# Patient Record
Sex: Female | Born: 1970
Health system: Southern US, Community
[De-identification: ages and names within clinical notes are randomized; demographics above are authoritative.]

## PROBLEM LIST (undated history)

## (undated) DIAGNOSIS — R112 Nausea with vomiting, unspecified: Secondary | ICD-10-CM

## (undated) DIAGNOSIS — R51 Headache: Secondary | ICD-10-CM

## (undated) DIAGNOSIS — G43909 Migraine, unspecified, not intractable, without status migrainosus: Secondary | ICD-10-CM

## (undated) DIAGNOSIS — E119 Type 2 diabetes mellitus without complications: Secondary | ICD-10-CM

## (undated) DIAGNOSIS — G4730 Sleep apnea, unspecified: Secondary | ICD-10-CM

## (undated) DIAGNOSIS — E559 Vitamin D deficiency, unspecified: Secondary | ICD-10-CM

## (undated) DIAGNOSIS — I10 Essential (primary) hypertension: Secondary | ICD-10-CM

## (undated) DIAGNOSIS — E669 Obesity, unspecified: Secondary | ICD-10-CM

## (undated) DIAGNOSIS — T4145XA Adverse effect of unspecified anesthetic, initial encounter: Secondary | ICD-10-CM

## (undated) DIAGNOSIS — J45909 Unspecified asthma, uncomplicated: Secondary | ICD-10-CM

## (undated) DIAGNOSIS — D649 Anemia, unspecified: Secondary | ICD-10-CM

## (undated) DIAGNOSIS — F32A Depression, unspecified: Secondary | ICD-10-CM

## (undated) DIAGNOSIS — R0602 Shortness of breath: Secondary | ICD-10-CM

## (undated) DIAGNOSIS — J45990 Exercise induced bronchospasm: Secondary | ICD-10-CM

## (undated) DIAGNOSIS — F329 Major depressive disorder, single episode, unspecified: Secondary | ICD-10-CM

## (undated) DIAGNOSIS — T8859XA Other complications of anesthesia, initial encounter: Secondary | ICD-10-CM

## (undated) DIAGNOSIS — R0982 Postnasal drip: Secondary | ICD-10-CM

## (undated) DIAGNOSIS — K589 Irritable bowel syndrome without diarrhea: Secondary | ICD-10-CM

## (undated) DIAGNOSIS — Z9889 Other specified postprocedural states: Secondary | ICD-10-CM

## (undated) DIAGNOSIS — J189 Pneumonia, unspecified organism: Secondary | ICD-10-CM

## (undated) DIAGNOSIS — F1990 Other psychoactive substance use, unspecified, uncomplicated: Secondary | ICD-10-CM

## (undated) DIAGNOSIS — E039 Hypothyroidism, unspecified: Secondary | ICD-10-CM

## (undated) DIAGNOSIS — K219 Gastro-esophageal reflux disease without esophagitis: Secondary | ICD-10-CM

## (undated) HISTORY — DX: Shortness of breath: R06.02

## (undated) HISTORY — DX: Depression, unspecified: F32.A

## (undated) HISTORY — DX: Irritable bowel syndrome, unspecified: K58.9

## (undated) HISTORY — DX: Essential (primary) hypertension: I10

## (undated) HISTORY — PX: TONSILLECTOMY: SUR1361

## (undated) HISTORY — DX: Unspecified asthma, uncomplicated: J45.909

## (undated) HISTORY — DX: Obesity, unspecified: E66.9

## (undated) HISTORY — DX: Exercise induced bronchospasm: J45.990

## (undated) HISTORY — DX: Sleep apnea, unspecified: G47.30

## (undated) HISTORY — DX: Migraine, unspecified, not intractable, without status migrainosus: G43.909

## (undated) HISTORY — DX: Gastro-esophageal reflux disease without esophagitis: K21.9

## (undated) HISTORY — PX: HERNIA REPAIR: SHX51

## (undated) HISTORY — DX: Vitamin D deficiency, unspecified: E55.9

## (undated) HISTORY — DX: Other psychoactive substance use, unspecified, uncomplicated: F19.90

## (undated) HISTORY — DX: Anemia, unspecified: D64.9

## (undated) HISTORY — DX: Type 2 diabetes mellitus without complications: E11.9

## (undated) HISTORY — DX: Major depressive disorder, single episode, unspecified: F32.9

---

## 2008-05-02 ENCOUNTER — Ambulatory Visit: Payer: Self-pay | Admitting: Occupational Medicine

## 2012-05-22 ENCOUNTER — Encounter: Payer: Self-pay | Admitting: Physician Assistant

## 2012-05-22 ENCOUNTER — Ambulatory Visit (INDEPENDENT_AMBULATORY_CARE_PROVIDER_SITE_OTHER): Payer: 59 | Admitting: Physician Assistant

## 2012-05-22 VITALS — BP 142/88 | HR 85 | Resp 14 | Ht 65.5 in | Wt 323.0 lb

## 2012-05-22 DIAGNOSIS — J45909 Unspecified asthma, uncomplicated: Secondary | ICD-10-CM

## 2012-05-22 DIAGNOSIS — E039 Hypothyroidism, unspecified: Secondary | ICD-10-CM

## 2012-05-22 DIAGNOSIS — R609 Edema, unspecified: Secondary | ICD-10-CM

## 2012-05-22 DIAGNOSIS — F341 Dysthymic disorder: Secondary | ICD-10-CM

## 2012-05-22 DIAGNOSIS — F418 Other specified anxiety disorders: Secondary | ICD-10-CM

## 2012-05-22 DIAGNOSIS — R600 Localized edema: Secondary | ICD-10-CM

## 2012-05-22 DIAGNOSIS — I10 Essential (primary) hypertension: Secondary | ICD-10-CM

## 2012-05-22 LAB — TSH: TSH: 8.651 u[IU]/mL — ABNORMAL HIGH (ref 0.350–4.500)

## 2012-05-22 LAB — COMPREHENSIVE METABOLIC PANEL
Alkaline Phosphatase: 87 U/L (ref 39–117)
Glucose, Bld: 119 mg/dL — ABNORMAL HIGH (ref 70–99)
Sodium: 137 mEq/L (ref 135–145)
Total Bilirubin: 0.4 mg/dL (ref 0.3–1.2)
Total Protein: 6.6 g/dL (ref 6.0–8.3)

## 2012-05-22 MED ORDER — BUPROPION HCL ER (XL) 150 MG PO TB24: 150.0000 mg | ORAL_TABLET | Freq: Every day | ORAL | Status: DC

## 2012-05-22 MED ORDER — AMBULATORY NON FORMULARY MEDICATION: Status: DC

## 2012-05-22 MED ORDER — LISINOPRIL-HYDROCHLOROTHIAZIDE 20-12.5 MG PO TABS: 1.0000 | ORAL_TABLET | Freq: Every day | ORAL | Status: DC

## 2012-05-22 NOTE — Patient Instructions (Addendum)
Will call with labs.   Start BP medication daily.   Start Wellbutrin for depression.   Peripheral Edema You have swelling in your legs (peripheral edema). This swelling is due to excess accumulation of salt and water in your body. Edema may be a sign of heart, kidney or liver disease, or a side effect of a medication. It may also be due to problems in the leg veins. Elevating your legs and using special support stockings may be very helpful, if the cause of the swelling is due to poor venous circulation. Avoid long periods of standing, whatever the cause. Treatment of edema depends on identifying the cause. Chips, pretzels, pickles and other salty foods should be avoided. Restricting salt in your diet is almost always needed. Water pills (diuretics) are often used to remove the excess salt and water from your body via urine. These medicines prevent the kidney from reabsorbing sodium. This increases urine flow. Diuretic treatment may also result in lowering of potassium levels in your body. Potassium supplements may be needed if you have to use diuretics daily. Daily weights can help you keep track of your progress in clearing your edema. You should call your caregiver for follow up care as recommended. SEEK IMMEDIATE MEDICAL CARE IF:   You have increased swelling, pain, redness, or heat in your legs.  You develop shortness of breath, especially when lying down.  You develop chest or abdominal pain, weakness, or fainting.  You have a fever. Document Released: 06/27/2004 Document Revised: 08/12/2011 Document Reviewed: 06/07/2009 [facility] Patient Information 2013 [place], [place].

## 2012-05-22 NOTE — Progress Notes (Signed)
  Subjective:    Patient ID: Amanda Davidson, female    DOB: 1970/08/23, 41 y.o.   MRN: [id number]  HPI Patient presents to clinic to establish care and to discuss peripheral edema of both legs. PMH was positive for hypothyroidism and migraines. She does have sleep apena and does have a cPAP.  She has had several episodes where her BP has been elevated she has not been treated. She denies any CP, palpitations, SOB, HA's or numbess or tingling. She does have swelling for lower bilateral legs that have been ongoing for 6 month or so. In the past 2 months the swelling has been daily. Denies any burning pain but does feel tight. She had been given lasix in the past and did not feel it helped or made her go to the bathroom more.  She does feel shortness of breath when do any exercises but denies any wheezing. She does have asthma that is very well controlled. She does have neb solution if needed. She has gained a lot of weight over past year about 60lbs this year. Last year this time she was biking 14 miles regularly now she is not exercising at all. She is very depressed and has some anxiety too. She tried zoloft but did not stay on it long enough and does not like taking medication for depression and anxiety.      Review of Systems     Objective:   Physical Exam  Constitutional: She is oriented to person, place, and time. She appears well-developed and well-nourished.       Obese.  HENT:  Head: Normocephalic and atraumatic.  Neck: Normal range of motion. Neck supple. No thyromegaly present.  Cardiovascular: Normal rate, regular rhythm and normal heart sounds.   Pulmonary/Chest: Effort normal and breath sounds normal. She has no wheezes.  Neurological: She is alert and oriented to person, place, and time.  Skin: Skin is warm and dry.       1+ Bilateral pitting edema up to med calf. Good pedal pulses bilaterally at 2+.Bilaterally scaly.  Psychiatric: She has a normal mood and affect. Her  behavior is normal.          Assessment & Plan:  Peripheral edema/Hypertension/Weight gain/hypothyroidism- Will check TSH/CMP/BNP. Will go ahead and treat elevated BP with Zestoretic daily. Discussed symptomatic care for swelling such as elevation and compression stockings. Gave rx for compression stockings. Follow up in 4-6 weeks to discuss swelling.   Depression- Start Wellbutrin. Discussed side effects and if worsening depression occurs to call office and stop meds. Does take 4-6 to get into systems. Recheck then.

## 2012-05-23 LAB — BRAIN NATRIURETIC PEPTIDE: Brain Natriuretic Peptide: 6.2 pg/mL (ref 0.0–100.0)

## 2012-05-24 DIAGNOSIS — G43909 Migraine, unspecified, not intractable, without status migrainosus: Secondary | ICD-10-CM | POA: Insufficient documentation

## 2012-05-24 DIAGNOSIS — E559 Vitamin D deficiency, unspecified: Secondary | ICD-10-CM | POA: Insufficient documentation

## 2012-05-24 DIAGNOSIS — G4733 Obstructive sleep apnea (adult) (pediatric): Secondary | ICD-10-CM | POA: Insufficient documentation

## 2012-05-24 DIAGNOSIS — F418 Other specified anxiety disorders: Secondary | ICD-10-CM | POA: Insufficient documentation

## 2012-05-24 DIAGNOSIS — J45909 Unspecified asthma, uncomplicated: Secondary | ICD-10-CM | POA: Insufficient documentation

## 2012-05-24 DIAGNOSIS — E039 Hypothyroidism, unspecified: Secondary | ICD-10-CM | POA: Insufficient documentation

## 2012-05-25 ENCOUNTER — Other Ambulatory Visit: Payer: Self-pay | Admitting: Physician Assistant

## 2012-05-25 MED ORDER — LEVOTHYROXINE SODIUM 112 MCG PO TABS: 112.0000 ug | ORAL_TABLET | Freq: Every day | ORAL | Status: DC

## 2012-06-11 ENCOUNTER — Telehealth: Payer: Self-pay | Admitting: *Deleted

## 2012-06-11 NOTE — Telephone Encounter (Signed)
Pharmacist calls and states that pt insurance will not cover the ventolin. Only covers the Proair or the Proventil. Please [facility] on [facility]

## 2012-06-12 MED ORDER — ALBUTEROL SULFATE HFA 108 (90 BASE) MCG/ACT IN AERS: 2.0000 | INHALATION_SPRAY | Freq: Four times a day (QID) | RESPIRATORY_TRACT | Status: DC | PRN

## 2012-06-12 NOTE — Telephone Encounter (Signed)
Sent proair inhaler to pharmacy to use only as needed. If using more than twice a week need ot have discussion about maintance inhaler.

## 2012-06-26 ENCOUNTER — Ambulatory Visit: Payer: 59 | Admitting: Physician Assistant

## 2012-06-29 ENCOUNTER — Encounter: Payer: Self-pay | Admitting: Physician Assistant

## 2012-06-29 ENCOUNTER — Ambulatory Visit (INDEPENDENT_AMBULATORY_CARE_PROVIDER_SITE_OTHER): Payer: 59 | Admitting: Physician Assistant

## 2012-06-29 VITALS — BP 139/79 | HR 108 | Resp 18 | Wt 319.0 lb

## 2012-06-29 DIAGNOSIS — G43909 Migraine, unspecified, not intractable, without status migrainosus: Secondary | ICD-10-CM

## 2012-06-29 DIAGNOSIS — E559 Vitamin D deficiency, unspecified: Secondary | ICD-10-CM

## 2012-06-29 DIAGNOSIS — I10 Essential (primary) hypertension: Secondary | ICD-10-CM

## 2012-06-29 DIAGNOSIS — E039 Hypothyroidism, unspecified: Secondary | ICD-10-CM

## 2012-06-29 DIAGNOSIS — F341 Dysthymic disorder: Secondary | ICD-10-CM

## 2012-06-29 DIAGNOSIS — R600 Localized edema: Secondary | ICD-10-CM

## 2012-06-29 DIAGNOSIS — R609 Edema, unspecified: Secondary | ICD-10-CM

## 2012-06-29 DIAGNOSIS — F418 Other specified anxiety disorders: Secondary | ICD-10-CM

## 2012-06-29 MED ORDER — LOSARTAN POTASSIUM-HCTZ 50-12.5 MG PO TABS: 1.0000 | ORAL_TABLET | Freq: Every day | ORAL | Status: DC

## 2012-06-29 MED ORDER — HYDROCODONE-HOMATROPINE 5-1.5 MG/5ML PO SYRP: 5.0000 mL | ORAL_SOLUTION | Freq: Every evening | ORAL | Status: DC | PRN

## 2012-06-29 MED ORDER — BUPROPION HCL ER (XL) 300 MG PO TB24: 300.0000 mg | ORAL_TABLET | Freq: Every day | ORAL | Status: DC

## 2012-06-29 MED ORDER — SUMATRIPTAN SUCCINATE 100 MG PO TABS: 100.0000 mg | ORAL_TABLET | ORAL | Status: DC | PRN

## 2012-06-29 NOTE — Patient Instructions (Addendum)
Try flonase/zyrtec daily to help with PND.  Stop Zestoretic and then start Hyzaar.   Increase wellbutrin.    CPE in 4 weeks.

## 2012-06-29 NOTE — Progress Notes (Signed)
Subjective:    Patient ID: Amanda Davidson, female    DOB: 10/21/1970, 42 y.o.   MRN: [id number]  HPI Patient presents to the clinic to follow up on hypertension, hypothyroidism and Depression.   HTN is ongoing and did start zestoretic daily. She was also having some bilateral edema of lower legs and it also is much better. Not sure if edema was due to thryoid levels being out of control or excess fluid and hypertension. Denies any CP, palpitations, HA's, numbness and tingling or vision changes. No associated or alleviating factors. She has had an ongoing dry cough that feels like a tickle in her throat. Seems like it started when she started BP medication. Denies fever, chills, muscle aches. She does have an inhaler that she has been using daily but does not seem to be helping. She does have asthma but usually controlled with inhaler as needed.   Hypothyroidism ongoing. Levels were elevated at last visit will recheck today. Has had more energy since last visit and feels much better. Denies starting exercise but has a plan to start in next week or so. Taking medication daily.   Depression/Anxiety ongoing but much better with Wellbutrin. She feels like she can "get up and go now". She has been cleaning her house and keeping up with life things that she was not doing before. She feels like she has not been stress eating as much.   Migraines are ongoing. She usually has 3-4 a month and stopped with Imitrex. Needs refill today.   Vitamin D deficiency is ongoing. Pt still taking 50,000 weekly and has been for over a year. Will recheck level and see if can now step down to OTC therapy.   Review of Systems  All other systems reviewed and are negative.       Objective:   Physical Exam  Constitutional: She is oriented to person, place, and time. She appears well-developed and well-nourished.  HENT:  Head: Normocephalic and atraumatic.  Right Ear: External ear normal.  Left Ear: External ear  normal.  Nose: Nose normal.  Mouth/Throat: Oropharynx is clear and moist. No oropharyngeal exudate.  Eyes: Conjunctivae normal are normal.  Neck: Normal range of motion. Neck supple.  Cardiovascular: Normal rate, regular rhythm and normal heart sounds.   Pulmonary/Chest: Effort normal and breath sounds normal. She has no wheezes.  Lymphadenopathy:    She has no cervical adenopathy.  Neurological: She is alert and oriented to person, place, and time.  Skin: Skin is warm and dry.       Mild edema around ankles bilaterally.  Psychiatric: She has a normal mood and affect. Her behavior is normal.          Assessment & Plan:  Hypertension/cough- will change medication just to make sure cough is not due to ACE inhibitor. Start new medication. Did encourage patient to use albuterol inhaler more regularly for next couple of days and cough syrup given to use as night. If cough continues asthma exacerbation might be a consideration. We might need to go on a daily ICS. Reminded of low salt diet. BP still not at goal recheck in 4 weeks to make sure BP and cough have resolved. Call before with worsening symptoms.   Depression/Anxiety- PHQ-9- 5. Pt would like to increase Wellbutrin to 300mg . Encouraged pt to start exercising and this could also give her extra benefit. Follow at CPE.   Hypothyroidism- Will recheck TSH today. Continue on levothyroxine daily. Will call with results and any changes.  Migraines- Refilled imitrex. Watch for triggers and try to stay ahead of migraines.   Vitamin D Deficiency- will check level today and see where she needs to be with therapy.   Schedule a CPE in next couple of weeks.

## 2012-06-30 DIAGNOSIS — I10 Essential (primary) hypertension: Secondary | ICD-10-CM | POA: Insufficient documentation

## 2012-07-01 ENCOUNTER — Other Ambulatory Visit: Payer: Self-pay | Admitting: Physician Assistant

## 2012-07-01 MED ORDER — LEVOTHYROXINE SODIUM 137 MCG PO TABS: 137.0000 ug | ORAL_TABLET | Freq: Every day | ORAL | Status: DC

## 2012-07-03 ENCOUNTER — Telehealth: Payer: Self-pay | Admitting: *Deleted

## 2012-07-03 ENCOUNTER — Other Ambulatory Visit: Payer: Self-pay | Admitting: Physician Assistant

## 2012-07-03 MED ORDER — ERGOCALCIFEROL 1.25 MG (50000 UT) PO CAPS: 50000.0000 [IU] | ORAL_CAPSULE | ORAL | Status: DC

## 2012-07-06 ENCOUNTER — Other Ambulatory Visit: Payer: Self-pay | Admitting: *Deleted

## 2012-07-06 ENCOUNTER — Encounter: Payer: Self-pay | Admitting: Physician Assistant

## 2012-07-06 ENCOUNTER — Ambulatory Visit (INDEPENDENT_AMBULATORY_CARE_PROVIDER_SITE_OTHER): Payer: 59 | Admitting: Physician Assistant

## 2012-07-06 VITALS — BP 139/86 | HR 99 | Wt 318.0 lb

## 2012-07-06 DIAGNOSIS — J209 Acute bronchitis, unspecified: Secondary | ICD-10-CM

## 2012-07-06 MED ORDER — SUMATRIPTAN SUCCINATE 100 MG PO TABS: 100.0000 mg | ORAL_TABLET | ORAL | Status: DC | PRN

## 2012-07-06 MED ORDER — AZITHROMYCIN 250 MG PO TABS: ORAL_TABLET | ORAL | Status: DC

## 2012-07-06 MED ORDER — BUPROPION HCL ER (XL) 300 MG PO TB24: 300.0000 mg | ORAL_TABLET | Freq: Every day | ORAL | Status: DC

## 2012-07-06 MED ORDER — PREDNISONE 50 MG PO TABS: ORAL_TABLET | ORAL | Status: DC

## 2012-07-06 NOTE — Telephone Encounter (Signed)
Med sent to pharmacy.

## 2012-07-06 NOTE — Progress Notes (Signed)
  Subjective:    Patient ID: Amanda Davidson, female    DOB: [birth year], 42 y.o.   MRN: [id number]  Cough This is a new problem. The current episode started 1 to 4 weeks ago. The problem has been gradually worsening. The problem occurs every few minutes. The cough is productive of sputum. Associated symptoms include ear congestion, headaches, nasal congestion, postnasal drip, shortness of breath and wheezing. Pertinent negatives include no chest pain, chills, ear pain, fever, heartburn, hemoptysis, myalgias, rash, rhinorrhea, sore throat, sweats or weight loss. The symptoms are aggravated by lying down. She has tried a beta-agonist inhaler, body position changes, OTC cough suppressant, rest and prescription cough suppressant for the symptoms. The treatment provided mild relief. Her past medical history is significant for asthma, bronchitis and pneumonia.       Review of Systems  Constitutional: Negative for fever, chills and weight loss.  HENT: Positive for postnasal drip. Negative for ear pain, sore throat and rhinorrhea.   Respiratory: Positive for cough, shortness of breath and wheezing. Negative for hemoptysis.   Cardiovascular: Negative for chest pain.  Gastrointestinal: Negative for heartburn.  Musculoskeletal: Negative for myalgias.  Skin: Negative for rash.  Neurological: Positive for headaches.       Objective:   Physical Exam  Constitutional: She is oriented to person, place, and time. She appears well-developed and well-nourished.  HENT:  Head: Normocephalic and atraumatic.  Right Ear: External ear normal.  Left Ear: External ear normal.  Nose: Nose normal.  Mouth/Throat: Oropharynx is clear and moist. No oropharyngeal exudate.       TM's clear bilaterally.   Negative for maxillary tenderness bilaterally.  Eyes: Conjunctivae normal are normal.  Neck: Normal range of motion. Neck supple.  Cardiovascular: Normal rate, regular rhythm and normal heart sounds.    Pulmonary/Chest: Effort normal. She has wheezes.  Lymphadenopathy:    She has no cervical adenopathy.  Neurological: She is alert and oriented to person, place, and time.  Skin:       Cheeks bilaterally flushed.  Psychiatric: She has a normal mood and affect. Her behavior is normal.          Assessment & Plan:  Acute bronchitis- Started Zpak and gave 5 days of prednisone. Continue to use cough syrup at night. Continue to use albuterol inhaler as needed. Follow up if not improving.

## 2012-07-06 NOTE — Patient Instructions (Addendum)

## 2012-07-10 ENCOUNTER — Encounter: Payer: Self-pay | Admitting: Physician Assistant

## 2012-07-10 ENCOUNTER — Ambulatory Visit (INDEPENDENT_AMBULATORY_CARE_PROVIDER_SITE_OTHER): Payer: 59

## 2012-07-10 ENCOUNTER — Ambulatory Visit (INDEPENDENT_AMBULATORY_CARE_PROVIDER_SITE_OTHER): Payer: 59 | Admitting: Physician Assistant

## 2012-07-10 VITALS — BP 136/86 | HR 92 | Temp 98.3°F | Wt 318.0 lb

## 2012-07-10 DIAGNOSIS — R0602 Shortness of breath: Secondary | ICD-10-CM

## 2012-07-10 DIAGNOSIS — R059 Cough, unspecified: Secondary | ICD-10-CM

## 2012-07-10 DIAGNOSIS — R0989 Other specified symptoms and signs involving the circulatory and respiratory systems: Secondary | ICD-10-CM

## 2012-07-10 DIAGNOSIS — R05 Cough: Secondary | ICD-10-CM

## 2012-07-10 DIAGNOSIS — R062 Wheezing: Secondary | ICD-10-CM

## 2012-07-10 DIAGNOSIS — J45901 Unspecified asthma with (acute) exacerbation: Secondary | ICD-10-CM

## 2012-07-10 DIAGNOSIS — J40 Bronchitis, not specified as acute or chronic: Secondary | ICD-10-CM

## 2012-07-10 MED ORDER — HYDROCODONE-HOMATROPINE 5-1.5 MG/5ML PO SYRP: 5.0000 mL | ORAL_SOLUTION | Freq: Every evening | ORAL | Status: DC | PRN

## 2012-07-10 MED ORDER — DOXYCYCLINE HYCLATE 100 MG PO CAPS: 100.0000 mg | ORAL_CAPSULE | Freq: Two times a day (BID) | ORAL | Status: DC

## 2012-07-10 MED ORDER — BECLOMETHASONE DIPROPIONATE 40 MCG/ACT IN AERS
2.0000 | INHALATION_SPRAY | Freq: Two times a day (BID) | RESPIRATORY_TRACT | Status: DC
Start: 2012-07-10 — End: 2012-07-28

## 2012-07-10 MED ORDER — PREDNISONE 50 MG PO TABS: ORAL_TABLET | ORAL | Status: DC

## 2012-07-10 MED ORDER — ALBUTEROL SULFATE (2.5 MG/3ML) 0.083% IN NEBU
2.5000 mg | INHALATION_SOLUTION | Freq: Four times a day (QID) | RESPIRATORY_TRACT | Status: DC | PRN
  Administered 2012-07-10: 2.5 mg via RESPIRATORY_TRACT

## 2012-07-10 NOTE — Progress Notes (Signed)
  Subjective:    Patient ID: Amanda Davidson, female    DOB: June 28, 1970, 42 y.o.   MRN: [id number]  HPI Patient presents to the clinic because still not feeling better after zpak, cough syrup and prednisone. She has finished her Hycodan and continues to cough. She continues to not have a fever, chills, muscle leg, or ear pain. She denies any flu contacts. Her cough continues to be productive with green/brown sputum. Patient denies any sinus pressure. Her throat has been sore the last couple days to 2 coughing. She continues to wheeze and was worse at night. She is using nasonex and finished zpak.      Review of Systems     Objective:   Physical Exam  Constitutional: She is oriented to person, place, and time. She appears well-developed and well-nourished.       Obese.  HENT:  Head: Normocephalic and atraumatic.  Right Ear: External ear normal.  Left Ear: External ear normal.  Mouth/Throat: Oropharynx is clear and moist. No oropharyngeal exudate.       TM's clear with no blood or pus.   Turbinates red and swollen.   Negative for maxillary or frontal tenderness.  Eyes: Conjunctivae normal are normal.  Neck: Normal range of motion. Neck supple.  Cardiovascular: Normal rate, regular rhythm and normal heart sounds.   Pulmonary/Chest: Effort normal and breath sounds normal.       No wheezing heard in the right lung significant wheezing heard at the apex of left lung with inspiration.   Lymphadenopathy:    She has no cervical adenopathy.  Neurological: She is alert and oriented to person, place, and time.  Skin: Skin is warm and dry.  Psychiatric: She has a normal mood and affect. Her behavior is normal.          Assessment & Plan:  Bronchitis/astma exacerbation-would like to rule out pneumonia therefore we'll get chest x-ray. Did give nebulizer in office and patient did improve. Continue to use nasonex daily.  Chest x-ray was negative for pneumonia but positive for bronchitis.  I did refill Hycodan for cough at night. Continue to use albuterol inhaler as needed. Added Qvar to use 2 puffs twice a day for 2 weeks. If not improving in the next 2 weeks call office. Also called in another round of prednisone.

## 2012-07-15 ENCOUNTER — Other Ambulatory Visit: Payer: Self-pay | Admitting: Physician Assistant

## 2012-07-28 ENCOUNTER — Ambulatory Visit (INDEPENDENT_AMBULATORY_CARE_PROVIDER_SITE_OTHER): Payer: 59 | Admitting: Physician Assistant

## 2012-07-28 ENCOUNTER — Encounter: Payer: Self-pay | Admitting: Physician Assistant

## 2012-07-28 VITALS — BP 133/69 | HR 91 | Wt 322.0 lb

## 2012-07-28 DIAGNOSIS — Z131 Encounter for screening for diabetes mellitus: Secondary | ICD-10-CM

## 2012-07-28 DIAGNOSIS — Z13220 Encounter for screening for lipoid disorders: Secondary | ICD-10-CM

## 2012-07-28 DIAGNOSIS — E039 Hypothyroidism, unspecified: Secondary | ICD-10-CM

## 2012-07-28 DIAGNOSIS — E559 Vitamin D deficiency, unspecified: Secondary | ICD-10-CM

## 2012-07-28 DIAGNOSIS — Z0000 Encounter for general adult medical examination without abnormal findings: Secondary | ICD-10-CM

## 2012-07-28 DIAGNOSIS — Z1239 Encounter for other screening for malignant neoplasm of breast: Secondary | ICD-10-CM

## 2012-07-28 DIAGNOSIS — J45909 Unspecified asthma, uncomplicated: Secondary | ICD-10-CM

## 2012-07-28 NOTE — Progress Notes (Signed)
  Subjective:     Amanda Davidson is a 42 y.o. female and is here for a comprehensive physical exam. The patient reports problems - Pt is still having problems with SOB, Wheezing, and cough since being treated for bronchitis. She has asthma but in the past was controlled with albuterol inhaler. .  History   Social History  . Marital Status: Legally Separated    Spouse Name: N/A    Number of Children: N/A  . Years of Education: N/A   Occupational History  . Not on file.   Social History Main Topics  . Smoking status: Never Smoker   . Smokeless tobacco: Never Used  . Alcohol Use: 0.5 oz/week    1 drink(s) per week  . Drug Use: No  . Sexually Active: Not on file   Other Topics Concern  . Not on file   Social History Narrative  . No narrative on file   Health Maintenance  Topic Date Due  . Influenza Vaccine  02/01/1970  . Mammogram  05/23/2011  . Pap Smear  03/23/2015  . Tetanus/tdap  05/22/2017    The following portions of the patient's history were reviewed and updated as appropriate: allergies, current medications, past family history, past medical history, past social history, past surgical history and problem list.  Review of Systems A comprehensive review of systems was negative.   Objective:    BP 133/69  Pulse 91  Wt 322 lb (146.058 kg)  BMI 52.75 kg/m2  LMP 07/04/2012 General appearance: alert, cooperative, appears stated age and moderately obese Head: Normocephalic, without obvious abnormality, atraumatic Eyes: conjunctivae/corneas clear. PERRL, EOM's intact. Fundi benign. Ears: normal TM's and external ear canals both ears Nose: Nares normal. Septum midline. Mucosa normal. No drainage or sinus tenderness. Throat: lips, mucosa, and tongue normal; teeth and gums normal Neck: no adenopathy, no carotid bruit, no JVD, supple, symmetrical, trachea midline and thyroid not enlarged, symmetric, no tenderness/mass/nodules Back: symmetric, no curvature. ROM normal.  No CVA tenderness. Lungs: clear to auscultation bilaterally Heart: regular rate and rhythm, S1, S2 normal, no murmur, click, rub or gallop Abdomen: soft, non-tender; bowel sounds normal; no masses,  no organomegaly Extremities: extremities normal, atraumatic, no cyanosis or edema Pulses: 2+ and symmetric Skin: Skin color, texture, turgor normal. No rashes or lesions Lymph nodes: Cervical, supraclavicular, and axillary nodes normal. Neurologic: Grossly normal    Assessment:    Healthy female exam.      Plan:    CPE/asthmatic bronchitis-Patient gets [facility]. Vaccines up to date. Gave labs to get drawn with recheck of TSH and Vitamin D in 2 weeks. Stopped Qvar and started Symbicort 80/160 2 puffs BID. Continue to use albuterol inhaler as needed. If not improving call office. Continue to try to exercise regularly.  See After Visit Summary for Counseling Recommendations

## 2012-07-28 NOTE — Patient Instructions (Signed)
Get labs drawn in 2 weeks with mammogram.   Will call with labs.   Try symbicort stop qvar 2 puffs twice a day.

## 2012-08-05 ENCOUNTER — Ambulatory Visit (HOSPITAL_BASED_OUTPATIENT_CLINIC_OR_DEPARTMENT_OTHER): Payer: 59

## 2012-08-28 LAB — COMPREHENSIVE METABOLIC PANEL
AST: 24 U/L (ref 0–37)
Albumin: 3.6 g/dL (ref 3.5–5.2)
Alkaline Phosphatase: 77 U/L (ref 39–117)
BUN: 12 mg/dL (ref 6–23)
Calcium: 9.4 mg/dL (ref 8.4–10.5)
Chloride: 103 mEq/L (ref 96–112)
Glucose, Bld: 115 mg/dL — ABNORMAL HIGH (ref 70–99)
Potassium: 4.7 mEq/L (ref 3.5–5.3)
Sodium: 139 mEq/L (ref 135–145)
Total Protein: 6.2 g/dL (ref 6.0–8.3)

## 2012-08-28 LAB — LIPID PANEL
Cholesterol: 141 mg/dL (ref 0–200)
Total CHOL/HDL Ratio: 4 Ratio
VLDL: 21 mg/dL (ref 0–40)

## 2012-08-28 LAB — TSH: TSH: 2.043 u[IU]/mL (ref 0.350–4.500)

## 2012-08-29 LAB — VITAMIN D 25 HYDROXY (VIT D DEFICIENCY, FRACTURES): Vit D, 25-Hydroxy: 31 ng/mL (ref 30–89)

## 2012-08-31 ENCOUNTER — Telehealth: Payer: Self-pay | Admitting: *Deleted

## 2012-08-31 DIAGNOSIS — R7301 Impaired fasting glucose: Secondary | ICD-10-CM

## 2012-08-31 NOTE — Telephone Encounter (Signed)
a1c order placed

## 2012-09-08 ENCOUNTER — Other Ambulatory Visit: Payer: Self-pay | Admitting: *Deleted

## 2012-09-08 DIAGNOSIS — R7301 Impaired fasting glucose: Secondary | ICD-10-CM

## 2012-09-14 ENCOUNTER — Other Ambulatory Visit: Payer: Self-pay | Admitting: *Deleted

## 2012-09-14 MED ORDER — METFORMIN HCL 500 MG PO TABS: 500.0000 mg | ORAL_TABLET | Freq: Two times a day (BID) | ORAL | Status: DC

## 2012-09-16 ENCOUNTER — Encounter: Payer: Self-pay | Admitting: Physician Assistant

## 2012-09-16 ENCOUNTER — Ambulatory Visit (INDEPENDENT_AMBULATORY_CARE_PROVIDER_SITE_OTHER): Payer: 59 | Admitting: Physician Assistant

## 2012-09-16 VITALS — BP 128/73 | HR 89 | Wt 317.0 lb

## 2012-09-16 DIAGNOSIS — E119 Type 2 diabetes mellitus without complications: Secondary | ICD-10-CM

## 2012-09-16 DIAGNOSIS — J45909 Unspecified asthma, uncomplicated: Secondary | ICD-10-CM

## 2012-09-16 DIAGNOSIS — J4540 Moderate persistent asthma, uncomplicated: Secondary | ICD-10-CM

## 2012-09-16 DIAGNOSIS — J309 Allergic rhinitis, unspecified: Secondary | ICD-10-CM

## 2012-09-16 MED ORDER — LORCASERIN HCL 10 MG PO TABS: 10.0000 mg | ORAL_TABLET | Freq: Two times a day (BID) | ORAL | Status: DC

## 2012-09-16 MED ORDER — METFORMIN HCL 1000 MG PO TABS: 1000.0000 mg | ORAL_TABLET | Freq: Two times a day (BID) | ORAL | Status: DC

## 2012-09-16 MED ORDER — MONTELUKAST SODIUM 10 MG PO TABS: 10.0000 mg | ORAL_TABLET | Freq: Every day | ORAL | Status: DC

## 2012-09-16 MED ORDER — MOMETASONE FUROATE 50 MCG/ACT NA SUSP: 2.0000 | Freq: Every day | NASAL | Status: DC

## 2012-09-16 NOTE — Progress Notes (Signed)
  Subjective:    Patient ID: Amanda Davidson, female    DOB: Oct 24, 1970, 41 y.o.   MRN: [id number]  HPI Patient comes in today to talk about recent dx of diabetes.   She also has been using her inhaler for asthma twice a day or even more. She has a history of asthma but has never needed an every day inhaler. She does have allergies and taking claritin daily. REports chest tightness, SOB, wheezing on a regular basis. Denies any fever, chills. Dry cough occasionally. Nothing makes worse and albuterol does help.      Review of Systems     Objective:   Physical Exam  Constitutional: She is oriented to person, place, and time. She appears well-developed and well-nourished.  HENT:  Head: Normocephalic and atraumatic.  Cardiovascular: Normal rate, regular rhythm and normal heart sounds.   Pulmonary/Chest:  Mild distant wheezing bilaterally.   Neurological: She is alert and oriented to person, place, and time.  Skin: Skin is warm and dry.  Psychiatric: She has a normal mood and affect. Her behavior is normal.          Assessment & Plan:  Diabetes mellulitis/obesity- A!C was 6.9. Will refer to diabetic nutrition. Gave handout of diets today. Started on metformin and tolerated 500mg  BID dose. Increased to 1000mg  BID. Discussed regular exercise. Talked about sugars and carbs. Encouraged pt to increase protein. Discussed effects of Diabetes and importance of sugar control. Weight loss is also important. Started on Belviq. Encouraged eye exam and regular maintence. Follow up in 3 months.   Asthma, uncontrolled/allergies- Pt resisent to try daily inhaler. Gave sample of symbicort 2 puffs BID. Follow up in 1 month. If doing better will give rx. Call if run out of sample for rx. Goal is to not use albuterol daily but no more than once a week. Continue on claritin. Added singulair. Gave rx for nasonex.  Spent 30 minutes with patient and greater than 50 percent of time spent counseling patient  regarding obesity and diabetes management.

## 2012-09-16 NOTE — Patient Instructions (Addendum)
Diabetes and Exercise Regular exercise is important and can help:   Control blood glucose (sugar).  Decrease blood pressure.    Control blood lipids (cholesterol, triglycerides).  Improve overall health. BENEFITS FROM EXERCISE  Improved fitness.  Improved flexibility.  Improved endurance.  Increased bone density.  Weight control.  Increased muscle strength.  Decreased body fat.  Improvement of the body's use of insulin, a hormone.  Increased insulin sensitivity.  Reduction of insulin needs.  Reduced stress and tension.  Helps you feel better. People with diabetes who add exercise to their lifestyle gain additional benefits, including:  Weight loss.  Reduced appetite.  Improvement of the body's use of blood glucose.  Decreased risk factors for heart disease:  Lowering of cholesterol and triglycerides.  Raising the level of good cholesterol (high-density lipoproteins, HDL).  Lowering blood sugar.  Decreased blood pressure. TYPE 1 DIABETES AND EXERCISE  Exercise will usually lower your blood glucose.  If blood glucose is greater than 240 mg/dl, check urine ketones. If ketones are present, do not exercise.  Location of the insulin injection sites may need to be adjusted with exercise. Avoid injecting insulin into areas of the body that will be exercised. For example, avoid injecting insulin into:  The arms when playing tennis.  The legs when jogging. For more information, discuss this with your caregiver.  Keep a record of:  Food intake.  Type and amount of exercise.  Expected peak times of insulin action.  Blood glucose levels. Do this before, during, and after exercise. Review your records with your caregiver. This will help you to develop guidelines for adjusting food intake and insulin amounts.  TYPE 2 DIABETES AND EXERCISE  Regular physical activity can help control blood glucose.  Exercise is important because it may:  Increase the  body's sensitivity to insulin.  Improve blood glucose control.  Exercise reduces the risk of heart disease. It decreases serum cholesterol and triglycerides. It also lowers blood pressure.  Those who take insulin or oral hypoglycemic agents should watch for signs of hypoglycemia. These signs include dizziness, shaking, sweating, chills, and confusion.  Body water is lost during exercise. It must be replaced. This will help to avoid loss of body fluids (dehydration) or heat stroke. Be sure to talk to your caregiver before starting an exercise program to make sure it is safe for you. Remember, any activity is better than none.  Document Released: 08/10/2003 Document Revised: 08/12/2011 Document Reviewed: 11/24/2008 [facility] Patient Information 2013 [place], [place]. 1800 Calorie Diet for Diabetes Meal Planning The 1800 calorie diet is designed for eating up to 1800 calories each day. Following this diet and making healthy meal choices can help improve overall health. This diet controls blood sugar (glucose) levels and can also help lower blood pressure and cholesterol. SERVING SIZES Measuring foods and serving sizes helps to make sure you are getting the right amount of food. The list below tells how big or small some common serving sizes are:  1 oz.........4 stacked dice.  3 oz........Marland KitchenDeck of cards.  1 tsp.......Marland KitchenTip of little finger.  1 tbs......Marland KitchenMarland KitchenThumb.  2 tbs.......Marland KitchenGolf ball.   cup......Marland KitchenHalf of a fist.  1 cup.......Marland KitchenA fist. GUIDELINES FOR CHOOSING FOODS The goal of this diet is to eat a variety of foods and limit calories to 1800 each day. This can be done by choosing foods that are low in calories and fat. The diet also suggests eating small amounts of food frequently. Doing this helps control your blood glucose levels so they do  not get too high or too low. Each meal or snack may include a protein food source to help you feel more satisfied and to stabilize your blood glucose.  Try to eat about the same amount of food around the same time each day. This includes weekend days, travel days, and days off work. Space your meals about 4 to 5 hours apart and add a snack between them if you wish.  For example, a daily food plan could include breakfast, a morning snack, lunch, dinner, and an evening snack. Healthy meals and snacks include whole grains, vegetables, fruits, lean meats, poultry, fish, and dairy products. As you plan your meals, select a variety of foods. Choose from the bread and starch, vegetable, fruit, dairy, and meat/protein groups. Examples of foods from each group and their suggested serving sizes are listed below. Use measuring cups and spoons to become familiar with what a healthy portion looks like. Bread and Starch Each serving equals 15 grams of carbohydrates.  1 slice bread.   bagel.   cup cold cereal (unsweetened).   cup hot cereal or mashed potatoes.  1 small potato (size of a computer mouse).   cup cooked pasta or rice.   English muffin.  1 cup broth-based soup.  3 cups of popcorn.  4 to 6 whole-wheat crackers.   cup cooked beans, peas, or corn. Vegetable Each serving equals 5 grams of carbohydrates.   cup cooked vegetables.  1 cup raw vegetables.   cup tomato or vegetable juice. Fruit Each serving equals 15 grams of carbohydrates.  1 small apple or orange.  1 cup watermelon or strawberries.   cup applesauce (no sugar added).  2 tbs raisins.   banana.   cup canned fruit, packed in water, its own juice, or sweetened with a sugar substitute.   cup unsweetened fruit juice. Dairy Each serving equals 12 to 15 grams of carbohydrates.  1 cup fat-free milk.  6 oz artificially sweetened yogurt or plain yogurt.  1 cup low-fat buttermilk.  1 cup soy milk.  1 cup almond milk. Meat/Protein  1 large egg.  2 to 3 oz meat, poultry, or fish.   cup low-fat cottage cheese.  1 tbs peanut butter.  1 oz  low-fat cheese.   cup tuna in water.   cup tofu. Fat  1 tsp oil.  1 tsp trans-fat-free margarine.  1 tsp butter.  1 tsp mayonnaise.  2 tbs avocado.  1 tbs salad dressing.  1 tbs cream cheese.  2 tbs sour cream. SAMPLE 1800 CALORIE DIET PLAN Breakfast   cup unsweetened cereal (1 carb serving).  1 cup fat-free milk (1 carb serving).  1 slice whole-wheat toast (1 carb serving).   small banana (1 carb serving).  1 scrambled egg.  1 tsp trans-fat-free margarine. Lunch  Tuna sandwich.  2 slices whole-wheat bread (2 carb servings).   cup canned tuna in water, drained.  1 tbs reduced fat mayonnaise.  1 stalk celery, chopped.  2 slices tomato.  1 lettuce leaf.  1 cup carrot sticks.  24 to 30 seedless grapes (2 carb servings).  6 oz light yogurt (1 carb serving). Afternoon Snack  3 graham cracker squares (1 carb serving).  Fat-free milk, 1 cup (1 carb serving).  1 tbs peanut butter. Dinner  3 oz salmon, broiled with 1 tsp oil.  1 cup mashed potatoes (2 carb servings) with 1 tsp trans-fat-free margarine.  1 cup fresh or frozen green beans.  1 cup steamed asparagus.  1 cup fat-free milk (1 carb serving). Evening Snack  3 cups air-popped popcorn (1 carb serving).  2 tbs parmesan cheese sprinkled on top. MEAL PLAN Use this worksheet to help you make a daily meal plan based on the 1800 calorie diet suggestions. If you are using this plan to help you control your blood glucose, you may interchange carbohydrate-containing foods (dairy, starches, and fruits). Select a variety of fresh foods of varying colors and flavors. The total amount of carbohydrate in your meals or snacks is more important than making sure you include all of the food groups every time you eat. Choose from the following foods to build your day's meals:  8 Starches.  4 Vegetables.  3 Fruits.  2 Dairy.  6 to 7 oz Meat/Protein.  Up to 4 Fats. Your dietician can use  this worksheet to help you decide how many servings and which types of foods are right for you. BREAKFAST Food Group and Servings / Food Choice Starch ________________________________________________________ Dairy _________________________________________________________ Fruit _________________________________________________________ Meat/Protein __________________________________________________ Fat ___________________________________________________________ LUNCH Food Group and Servings / Food Choice Starch ________________________________________________________ Meat/Protein __________________________________________________ Vegetable _____________________________________________________ Fruit _________________________________________________________ Dairy _________________________________________________________ Fat ___________________________________________________________ Aura Fey Food Group and Servings / Food Choice Starch ________________________________________________________ Meat/Protein __________________________________________________ Fruit __________________________________________________________ Dairy _________________________________________________________ Laural Golden Food Group and Servings / Food Choice Starch _________________________________________________________ Meat/Protein ___________________________________________________ Dairy __________________________________________________________ Vegetable ______________________________________________________ Fruit ___________________________________________________________ Fat ____________________________________________________________ Lollie Sails Food Group and Servings / Food Choice Fruit __________________________________________________________ Meat/Protein ___________________________________________________ Dairy __________________________________________________________ Starch  _________________________________________________________ DAILY TOTALS Starch ____________________________ Vegetable _________________________ Fruit _____________________________ Dairy _____________________________ Meat/Protein______________________ Fat _______________________________ Document Released: 12/10/2004 Document Revised: 08/12/2011 Document Reviewed: 04/05/2011 [employer] Patient Information 2013 [place], [place].

## 2012-09-18 DIAGNOSIS — J4540 Moderate persistent asthma, uncomplicated: Secondary | ICD-10-CM | POA: Insufficient documentation

## 2012-09-18 DIAGNOSIS — E119 Type 2 diabetes mellitus without complications: Secondary | ICD-10-CM | POA: Insufficient documentation

## 2012-09-30 ENCOUNTER — Other Ambulatory Visit: Payer: Self-pay | Admitting: Physician Assistant

## 2012-10-07 ENCOUNTER — Ambulatory Visit (INDEPENDENT_AMBULATORY_CARE_PROVIDER_SITE_OTHER): Payer: 59 | Admitting: Physician Assistant

## 2012-10-07 ENCOUNTER — Encounter: Payer: Self-pay | Admitting: Physician Assistant

## 2012-10-07 VITALS — BP 128/85 | HR 99 | Wt 307.0 lb

## 2012-10-07 DIAGNOSIS — R0602 Shortness of breath: Secondary | ICD-10-CM

## 2012-10-07 DIAGNOSIS — R062 Wheezing: Secondary | ICD-10-CM

## 2012-10-07 DIAGNOSIS — J45901 Unspecified asthma with (acute) exacerbation: Secondary | ICD-10-CM

## 2012-10-07 MED ORDER — BUDESONIDE-FORMOTEROL FUMARATE 80-4.5 MCG/ACT IN AERO: 2.0000 | INHALATION_SPRAY | Freq: Two times a day (BID) | RESPIRATORY_TRACT | Status: DC

## 2012-10-07 MED ORDER — ALBUTEROL SULFATE (2.5 MG/3ML) 0.083% IN NEBU
2.5000 mg | INHALATION_SOLUTION | Freq: Once | RESPIRATORY_TRACT | Status: AC
  Administered 2012-10-07: 2.5 mg via RESPIRATORY_TRACT

## 2012-10-07 MED ORDER — PREDNISONE 50 MG PO TABS: ORAL_TABLET | ORAL | Status: DC

## 2012-10-07 NOTE — Progress Notes (Signed)
  Subjective:    Patient ID: Amanda Davidson, female    DOB: 03-Oct-1970, 42 y.o.   MRN: [id number]  HPI Patient presents to the clinic with SOB and wheezing for the last 5 days. She has asthma that has been controlled on Symbicort, sinuglair and zyrtec.She has been out of zyrtec. Until recent exacerbation she did not have to use albuterol inhaler. For the last 5 days she has had to use multiple times a day. She has not used yet today. She went to [facility] and was outside a lot. Cough is productive and feels very tight in her chest. No fever, chills.     Review of Systems     Objective:   Physical Exam  Constitutional: She appears well-developed and well-nourished.  HENT:  Head: Normocephalic and atraumatic.  Cardiovascular: Normal rate, regular rhythm and normal heart sounds.   Pulmonary/Chest: She has wheezes.  Wheezing bilaterally.  Skin: Skin is warm.  Skin seemed clammy.  Psychiatric: She has a normal mood and affect. Her behavior is normal.          Assessment & Plan:  Asthma exacerbation- nebulizer in office. Refilled symbicort. Steroid given for 5 days. Continue using albuterol as needed. Add zyrtec back in and continue on singulair. Follow up if not improving could consider Abx or increase symbicort dose.

## 2012-10-07 NOTE — Patient Instructions (Addendum)
Zyrtec/singulair and symbicort 2 puffs twice a day. Albuterol as needed. Take steroid for 5 days.    Call if not improving.

## 2012-10-18 ENCOUNTER — Other Ambulatory Visit: Payer: Self-pay | Admitting: Physician Assistant

## 2012-10-22 ENCOUNTER — Encounter: Payer: Self-pay | Admitting: Dietician

## 2012-10-22 ENCOUNTER — Encounter: Payer: 59 | Attending: "Endocrinology | Admitting: Dietician

## 2012-10-22 VITALS — Ht 66.0 in | Wt 302.5 lb

## 2012-10-22 DIAGNOSIS — Z713 Dietary counseling and surveillance: Secondary | ICD-10-CM | POA: Insufficient documentation

## 2012-10-22 DIAGNOSIS — E119 Type 2 diabetes mellitus without complications: Secondary | ICD-10-CM | POA: Insufficient documentation

## 2012-10-22 NOTE — Progress Notes (Signed)
  Patient was seen on 10/22/2012 for the first of a series of three diabetes self-management courses at [facility]. The following learning objectives were met by the patient during this course:    HT:66 in  WT: 302.5 lb  A1C: 6.9% (08/28/2012)   Defines the role of glucose and insulin  Identifies type of diabetes and pathophysiology  Defines the diagnostic criteria for diabetes and prediabetes  States the risk factors for Type 2 Diabetes  States the symptoms of Type 2 Diabetes  Defines Type 2 Diabetes treatment goals  Defines Type 2 Diabetes treatment options  States the rationale for glucose monitoring  Identifies A1C, glucose targets, and testing times  Identifies proper sharps disposal  Defines the purpose of a diabetes food plan  Identifies carbohydrate food groups  Defines effects of carbohydrate foods on glucose levels  Identifies carbohydrate choices/grams/food labels  States benefits of physical activity and effect on glucose  Review of suggested activity guidelines  Handouts given during class include:  Type 2 Diabetes: Basics Book  My Food Plan Book  Food and Activity Log   Follow-Up Plan: Signed up for DM Self-management Core Class 2.  To check with insurance for coverage of classes

## 2012-12-10 ENCOUNTER — Other Ambulatory Visit: Payer: Self-pay

## 2012-12-16 ENCOUNTER — Other Ambulatory Visit: Payer: Self-pay | Admitting: Physician Assistant

## 2012-12-18 ENCOUNTER — Other Ambulatory Visit: Payer: Self-pay | Admitting: Physician Assistant

## 2012-12-21 ENCOUNTER — Ambulatory Visit: Payer: 59 | Admitting: Physician Assistant

## 2012-12-25 ENCOUNTER — Ambulatory Visit (INDEPENDENT_AMBULATORY_CARE_PROVIDER_SITE_OTHER): Payer: 59 | Admitting: Physician Assistant

## 2012-12-25 ENCOUNTER — Encounter: Payer: Self-pay | Admitting: Physician Assistant

## 2012-12-25 VITALS — BP 119/74 | HR 80 | Wt 299.0 lb

## 2012-12-25 DIAGNOSIS — E119 Type 2 diabetes mellitus without complications: Secondary | ICD-10-CM

## 2012-12-25 LAB — POCT GLYCOSYLATED HEMOGLOBIN (HGB A1C): Hemoglobin A1C: 5.9

## 2012-12-25 MED ORDER — LORCASERIN HCL 10 MG PO TABS: 10.0000 mg | ORAL_TABLET | Freq: Two times a day (BID) | ORAL | Status: DC

## 2012-12-25 NOTE — Progress Notes (Signed)
  Subjective:    Patient ID: Amanda Davidson, female    DOB: [birth year], 42 y.o.   MRN: [id number]  Diabetes She presents for her follow-up diabetic visit. She has type 2 diabetes mellitus. No MedicAlert identification noted. Her disease course has been improving. There are no hypoglycemic associated symptoms. There are no diabetic associated symptoms. There are no hypoglycemic complications. Symptoms are resolved. There are no diabetic complications. Risk factors for coronary artery disease include diabetes mellitus, family history, stress, obesity and hypertension. Current diabetic treatment includes diet and oral agent (monotherapy). She is compliant with treatment all of the time. Her weight is decreasing steadily. She is following a generally healthy diet. Meal planning includes avoidance of concentrated sweets. She has not had a previous visit with a dietician. She participates in exercise three times a week. An ACE inhibitor/angiotensin II receptor blocker is being taken. She does not see a podiatrist.Eye exam is current.  Pt is doing great and feels great.  Would like to talk about surgery options for weight loss.  Never started belviq because insurance would not pay.  Currently in weight watchers.      Review of Systems     Objective:   Physical Exam  Constitutional: She is oriented to person, place, and time. She appears well-developed and well-nourished.  HENT:  Head: Normocephalic and atraumatic.  Cardiovascular: Normal rate, regular rhythm and normal heart sounds.   Pulmonary/Chest: Effort normal and breath sounds normal.  Neurological: She is alert and oriented to person, place, and time.  Skin: Skin is warm and dry.  Psychiatric: She has a normal mood and affect. Her behavior is normal.          Assessment & Plan:  DM- A1C is 5.9. Discussed with patient she could decrease metformin to 500mg  BID but pt likes the way Metformin makes her feel and she feels like it helps  her with weight loss. I wrote new rx for Belviq previously ins stated they would not pay but she is going to look more into it and see if she cannot get approved. We discussed gastric bypass/sleeve surgery. Discussed benefits and risk. I reassured her that I would try to do it on her on and make the lifestyle changes and then if still not where we want her to be then could refer out to consider. She is on board with that option. Encouraged regular exercise and to increase intensity as well as being strict with weight watchers. We set a goal of 21 lbs of weight loss in the next 3 months. F/u in 3 months.    Spent 30 minutes with patient and greater than 50 percent of visit spent counseling patient regarding weight loss surgery.

## 2012-12-25 NOTE — Patient Instructions (Addendum)
Goal 21lbs of weight loss in 3 months.

## 2013-01-21 ENCOUNTER — Other Ambulatory Visit: Payer: Self-pay | Admitting: Physician Assistant

## 2013-02-10 ENCOUNTER — Ambulatory Visit (INDEPENDENT_AMBULATORY_CARE_PROVIDER_SITE_OTHER): Payer: 59 | Admitting: General Surgery

## 2013-02-10 ENCOUNTER — Encounter (INDEPENDENT_AMBULATORY_CARE_PROVIDER_SITE_OTHER): Payer: Self-pay | Admitting: General Surgery

## 2013-02-10 VITALS — BP 140/88 | HR 72 | Resp 16 | Ht 66.0 in | Wt 294.6 lb

## 2013-02-10 DIAGNOSIS — E119 Type 2 diabetes mellitus without complications: Secondary | ICD-10-CM

## 2013-02-10 DIAGNOSIS — J45909 Unspecified asthma, uncomplicated: Secondary | ICD-10-CM

## 2013-02-10 DIAGNOSIS — I10 Essential (primary) hypertension: Secondary | ICD-10-CM

## 2013-02-10 DIAGNOSIS — F329 Major depressive disorder, single episode, unspecified: Secondary | ICD-10-CM

## 2013-02-10 DIAGNOSIS — F32A Depression, unspecified: Secondary | ICD-10-CM

## 2013-02-10 DIAGNOSIS — Z6841 Body Mass Index (BMI) 40.0 and over, adult: Secondary | ICD-10-CM

## 2013-02-10 DIAGNOSIS — E039 Hypothyroidism, unspecified: Secondary | ICD-10-CM

## 2013-02-10 NOTE — Progress Notes (Signed)
Patient ID: Amanda Davidson, female   DOB: 22-Feb-1971, 42 y.o.   MRN: [id number]  Chief Complaint  Patient presents with  . Bariatric Pre-op    HPI Fredrick Dray is a 42 y.o. female.  This patient presents today for her initial weight loss surgery evaluation. She has a tender information session and comes in today interested in the vertical sleeve gastrectomy. She has a BMI of 47.5 with obesity related comorbidities of diabetes mellitus, hypertension, depression, and asthma. She has trouble with her weight ever since the birth of her children and she put on several pounds during her pregnancy but was unable to lose the weight after the birth. She has tried several diets including Weight Watchers and hCG shots and is currently on Belviq for weight loss.  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 She denies any significant reflux. She does have a history of umbilical hernia repair with mesh but she says that she has a recurrence just above her umbilicus as well. Otherwise she is current on her mammogram and routine health maintenance. HPI  Past Medical History  Diagnosis Date  . Hypertension   . Obesity   . Thyroid disease     hypo  . Exercise-induced asthma   . Diabetes mellitus without complication   . Sleep apnea     Past Surgical History  Procedure Laterality Date  . Hernia repair      Family History  Problem Relation Age of Onset  . Hypertension Mother   . Hypertension Father   . Diabetes Father   . Heart disease Maternal Grandfather   . Heart attack Maternal Grandfather   . Cancer Maternal Grandfather     bladder  . Hyperlipidemia Other   . Sleep apnea Other     Social History History  Substance Use Topics  . Smoking status: Never Smoker   . Smokeless tobacco: Never Used  . Alcohol Use: 0.5 oz/week    1 drink(s) per week    No Known Allergies  Current Outpatient Prescriptions  Medication  Sig Dispense Refill  . albuterol (PROVENTIL HFA;VENTOLIN HFA) 108 (90 BASE) MCG/ACT inhaler Inhale 2 puffs into the lungs every 6 (six) hours as needed for wheezing.  1 Inhaler  3  . AMBULATORY NON FORMULARY MEDICATION Medication Name: knee high compression stocking for both legs.  2 each  0  . budesonide-formoterol (SYMBICORT) 80-4.5 MCG/ACT inhaler Inhale 2 puffs into the lungs 2 (two) times daily.  1 Inhaler  6  . buPROPion (WELLBUTRIN XL) 300 MG 24 hr tablet Take 1 tablet (300 mg total) by mouth daily.  30 tablet  5  . levothyroxine (SYNTHROID, LEVOTHROID) 137 MCG tablet TAKE ONE TABLET BY MOUTH ONE TIME DAILY   30 tablet  3  . Lorcaserin HCl (BELVIQ) 10 MG TABS Take 10 mg by mouth 2 (two) times daily.  60 tablet  2  . losartan-hydrochlorothiazide (HYZAAR) 50-12.5 MG per tablet TAKE ONE TABLET ONCE DAILY  30 tablet  2  . metFORMIN (GLUCOPHAGE) 1000 MG tablet Take 1 tablet (1,000 mg total) by mouth 2 (two) times daily with a meal.  60 tablet  5  . montelukast (SINGULAIR) 10 MG tablet Take 1 tablet (10 mg total) by mouth at bedtime.  30 tablet  6  . SUMAtriptan (IMITREX) 100 MG tablet TAKE 1 TABLET (100 MG TOTAL) BY MOUTH EVERY 2 (TWO) HOURS AS NEEDED.  10 tablet  0  . mometasone (NASONEX) 50 MCG/ACT nasal spray Place 2 sprays into the nose daily.  17 g  6   No current facility-administered medications for this visit.    Review of Systems Review of Systems All other review of systems negative or noncontributory except as stated in the HPI  Blood pressure 140/88, pulse 72, resp. rate 16, height 5\' 6"  (1.676 m), weight 294 lb 9.6 oz (133.63 kg).  Physical Exam Physical Exam Physical Exam  Nursing note and vitals reviewed. Constitutional: She is oriented to person, place, and time. She appears well-developed and well-nourished. No distress.  HENT:  Head: Normocephalic and atraumatic.  Mouth/Throat: No oropharyngeal exudate.  Eyes: Conjunctivae and EOM are normal. Pupils are equal, round,  and reactive to light. Right eye exhibits no discharge. Left eye exhibits no discharge. No scleral icterus.  Neck: Normal range of motion. Neck supple. No tracheal deviation present.  Cardiovascular: Normal rate, regular rhythm, normal heart sounds and intact distal pulses.   Pulmonary/Chest: Effort normal and breath sounds normal. No stridor. No respiratory distress. She has no wheezes.  Abdominal: Soft. Bowel sounds are normal. She exhibits no distension and no mass. There is no tenderness. There is no rebound and no guarding. She does have a palpable bulge just above and to the right of her umbilicus which is likely a recurrent umbilical hernia Musculoskeletal: Normal range of motion. She exhibits no edema and no tenderness.  Neurological: She is alert and oriented to person, place, and time.  Skin: Skin is warm and dry. No rash noted. She is not diaphoretic. No erythema. No pallor.  Psychiatric: She has a normal mood and affect. Her behavior is normal. Judgment and thought content normal.    Data Reviewed   Assessment    Morbid obesity with a BMI of 47.5 and obesity related comorbidities of diabetes mellitus, hypertension, depression, asthma, and hypothyroidism She is interested in weight loss surgery and seems to have failed medical attempts. I think that she would be a fine candidate for any of the options which she chooses. We did discuss with her the options for medical and surgical weight loss including the options were that band, sleeve gastrectomy, and Roux-en-Y gastric bypass. She remains interested in the sleeve gastrectomy and we discussed the procedure and its risks and benefits.  The risks of infection, bleeding, pain, scarring, weight regain, too little or too much weight loss, vitamin deficiencies and need for lifelong vitamin supplementation, hair loss, need for protein supplementation, leaks, stricture, reflux, food intolerance, need for reoperation and conversion to roux Y  gastric bypass, need for open surgery, injury to spleen or surrounding structures, DVT's, PE, and death again discussed with the patient and the patient expressed understanding and desires to proceed with laparoscopic vertical sleeve gastrectomy, possible open, intraoperative endoscopy. She also has a umbilical hernia which is recurrent and we discussed the possibility of simultaneous repair but I think that the best option for her would be to perform the weight loss surgery and subsequent hernia repair after she has lost weight in order to minimize recurrence.     Plan     we will set her up for a vertical sleeve gastrectomy and a preoperative workup including upper GI, laboratory studies, nutrition and psychology evaluations        Lodema Pilot DAVID 02/10/2013, 4:52 PM

## 2013-02-16 ENCOUNTER — Encounter: Payer: Self-pay | Admitting: *Deleted

## 2013-02-19 ENCOUNTER — Other Ambulatory Visit: Payer: Self-pay | Admitting: Physician Assistant

## 2013-02-26 ENCOUNTER — Other Ambulatory Visit: Payer: Self-pay

## 2013-02-26 ENCOUNTER — Ambulatory Visit (HOSPITAL_COMMUNITY)
Admission: RE | Admit: 2013-02-26 | Discharge: 2013-02-26 | Disposition: A | Discharge status: Home / Self Care | Payer: 59 | Source: Ambulatory Visit | Attending: General Surgery | Admitting: General Surgery

## 2013-02-26 DIAGNOSIS — F3289 Other specified depressive episodes: Secondary | ICD-10-CM | POA: Insufficient documentation

## 2013-02-26 DIAGNOSIS — F329 Major depressive disorder, single episode, unspecified: Secondary | ICD-10-CM | POA: Insufficient documentation

## 2013-02-26 DIAGNOSIS — G4730 Sleep apnea, unspecified: Secondary | ICD-10-CM | POA: Insufficient documentation

## 2013-02-26 DIAGNOSIS — F32A Depression, unspecified: Secondary | ICD-10-CM

## 2013-02-26 DIAGNOSIS — I10 Essential (primary) hypertension: Secondary | ICD-10-CM

## 2013-02-26 DIAGNOSIS — E039 Hypothyroidism, unspecified: Secondary | ICD-10-CM | POA: Insufficient documentation

## 2013-02-26 DIAGNOSIS — Z6841 Body Mass Index (BMI) 40.0 and over, adult: Secondary | ICD-10-CM | POA: Insufficient documentation

## 2013-02-26 DIAGNOSIS — J45909 Unspecified asthma, uncomplicated: Secondary | ICD-10-CM

## 2013-02-26 DIAGNOSIS — E119 Type 2 diabetes mellitus without complications: Secondary | ICD-10-CM | POA: Insufficient documentation

## 2013-02-26 LAB — CBC WITH DIFFERENTIAL/PLATELET
Basophils Absolute: 0 10*3/uL (ref 0.0–0.1)
Basophils Relative: 0 % (ref 0–1)
Eosinophils Absolute: 0.1 10*3/uL (ref 0.0–0.7)
HCT: 38.6 % (ref 36.0–46.0)
Hemoglobin: 13.2 g/dL (ref 12.0–15.0)
MCH: 28.9 pg (ref 26.0–34.0)
MCHC: 34.2 g/dL (ref 30.0–36.0)
Monocytes Absolute: 0.5 10*3/uL (ref 0.1–1.0)
Monocytes Relative: 5 % (ref 3–12)
Neutro Abs: 7.4 10*3/uL (ref 1.7–7.7)
Neutrophils Relative %: 75 % (ref 43–77)
RDW: 15.1 % (ref 11.5–15.5)

## 2013-02-26 LAB — LIPID PANEL
Cholesterol: 186 mg/dL (ref 0–200)
HDL: 42 mg/dL (ref >39)
LDL Cholesterol: 120 mg/dL — ABNORMAL HIGH (ref 0–99)
Total CHOL/HDL Ratio: 4.4 Ratio
Triglycerides: 119 mg/dL (ref <150)
VLDL: 24 mg/dL (ref 0–40)

## 2013-02-26 LAB — COMPREHENSIVE METABOLIC PANEL
AST: 35 U/L (ref 0–37)
Alkaline Phosphatase: 91 U/L (ref 39–117)
BUN: 8 mg/dL (ref 6–23)
Glucose, Bld: 122 mg/dL — ABNORMAL HIGH (ref 70–99)
Total Bilirubin: 0.5 mg/dL (ref 0.3–1.2)

## 2013-03-03 ENCOUNTER — Ambulatory Visit: Payer: 59 | Admitting: *Deleted

## 2013-03-05 ENCOUNTER — Telehealth (INDEPENDENT_AMBULATORY_CARE_PROVIDER_SITE_OTHER): Payer: Self-pay

## 2013-03-05 ENCOUNTER — Other Ambulatory Visit (INDEPENDENT_AMBULATORY_CARE_PROVIDER_SITE_OTHER): Payer: Self-pay

## 2013-03-05 DIAGNOSIS — E079 Disorder of thyroid, unspecified: Secondary | ICD-10-CM

## 2013-03-05 NOTE — Telephone Encounter (Signed)
Message copied by Maryan Puls on Fri Mar 05, 2013  8:39 AM ------      Message from: Lodema Pilot      Created: Thu Mar 04, 2013 10:25 AM       Her thyroid medication needs adjusted if on it, otherwise she will need to see her PCP for evaluation of abnormal thyroid labs. ------

## 2013-03-05 NOTE — Telephone Encounter (Signed)
Called patient to make her aware that her thyroid levels are abnormal per Dr. Biagio Quint.  Spoke to patient and we will check patient's TSH again to make sure her thyroid levels are normal.  Patient aware that I will mail a lab slip to her and we will await results and call patient once we receive them.

## 2013-03-10 ENCOUNTER — Other Ambulatory Visit: Payer: Self-pay | Admitting: Physician Assistant

## 2013-03-18 ENCOUNTER — Ambulatory Visit: Payer: 59 | Admitting: *Deleted

## 2013-03-22 ENCOUNTER — Encounter: Payer: 59 | Attending: General Surgery | Admitting: *Deleted

## 2013-03-22 ENCOUNTER — Encounter: Payer: Self-pay | Admitting: *Deleted

## 2013-03-22 DIAGNOSIS — Z01818 Encounter for other preprocedural examination: Secondary | ICD-10-CM | POA: Insufficient documentation

## 2013-03-22 DIAGNOSIS — E669 Obesity, unspecified: Secondary | ICD-10-CM | POA: Insufficient documentation

## 2013-03-22 LAB — TSH: TSH: 2.326 u[IU]/mL (ref 0.350–4.500)

## 2013-03-22 NOTE — Patient Instructions (Signed)
   Follow Pre-Op Nutrition Goals to prepare for Sleeve Gastrectomy Surgery.   Call the [facility] at [phone] once you have been given your surgery date to enrolled in the Pre-Op Nutrition Class. You will need to attend this nutrition class 3-4 weeks prior to your surgery.  

## 2013-03-22 NOTE — Progress Notes (Signed)
  Pre-Op Assessment Visit:  Pre-Operative Sleeve Gastrectomy Surgery  Medical Nutrition Therapy:  Appt start time: 1100   End time:  1145.  Patient was seen on 03/22/2013 for Pre-Operative Sleeve Gastrectomy Nutrition Assessment. Assessment and letter of approval faxed to [facility] coordinator on 03/22/2013.   Handouts given during visit include:  Pre-Op Goals Bariatric Surgery Protein Shakes  Samples given during visit include:   Nectar Protein Powder: 1 pkt Lot: [id number]; Exp: 01/17  Patient to call the [facility] to enroll in Pre-Op and Post-Op Nutrition Education when surgery date is scheduled.

## 2013-03-29 ENCOUNTER — Other Ambulatory Visit: Payer: Self-pay | Admitting: Physician Assistant

## 2013-03-29 ENCOUNTER — Ambulatory Visit (INDEPENDENT_AMBULATORY_CARE_PROVIDER_SITE_OTHER): Payer: 59 | Admitting: Physician Assistant

## 2013-03-29 ENCOUNTER — Encounter: Payer: Self-pay | Admitting: Physician Assistant

## 2013-03-29 VITALS — BP 142/86 | HR 86 | Wt 287.0 lb

## 2013-03-29 DIAGNOSIS — E119 Type 2 diabetes mellitus without complications: Secondary | ICD-10-CM

## 2013-03-29 DIAGNOSIS — I10 Essential (primary) hypertension: Secondary | ICD-10-CM

## 2013-03-29 DIAGNOSIS — Z23 Encounter for immunization: Secondary | ICD-10-CM

## 2013-03-29 LAB — POCT GLYCOSYLATED HEMOGLOBIN (HGB A1C): Hemoglobin A1C: 5.6

## 2013-03-29 LAB — POCT UA - MICROALBUMIN
Albumin/Creatinine Ratio, Urine, POC: <30
Creatinine, POC: 200 mg/dL
Microalbumin Ur, POC: 30 mg/L

## 2013-03-29 NOTE — Progress Notes (Signed)
  Subjective:    Patient ID: Amanda Davidson, female    DOB: 1970-07-16, 42 y.o.   MRN: [id number]  HPI Patient is a 42 year old female who presents to the clinic to followup on diabetes and hypertension.  Diabetes mellitus-patient's last A1c was 6.9. We started her on metformin twice a day. She was also sent to a bariatric surgical center. They have been working with her on her nutrition and she has been seeing a psychiatrist. After much thought and discussion she has decided to move forth with a gastric sleeve. In the insurance approval she has to get this in December. She is not checking her sugars at all. She denies any hypoglycemic events. She is trying to exercise. She admits she only exercises about once to twice a week. She has made significant diet changes and is losing weight. She has lost a little over 30 pounds since the start of this weight loss journey. Patient was her nutritionist and feels like she is a great support. Insurance did not approve of the therefore patient did not continue with therapy. Patient admits to not seeing an ophthalmologist. She denies any vision changes or neuropathy.  5.6 A1C  Hypertension-Patient admits to not taking her blood pressure medicine for the last couple days. She went on a weekend trip and forgot her meds. She denies any chest pains, palpitations, headaches, vision changes. She is trying to keep a low salt diet.   Review of Systems     Objective:   Physical Exam  Constitutional: She is oriented to person, place, and time. She appears well-developed and well-nourished.  HENT:  Head: Normocephalic and atraumatic.  Cardiovascular: Normal rate, regular rhythm and normal heart sounds.   Pulmonary/Chest: Effort normal and breath sounds normal.  Neurological: She is alert and oriented to person, place, and time.  Skin: Skin is warm and dry.  Psychiatric: She has a normal mood and affect. Her behavior is normal.          Assessment & Plan:   Diabetes mellitus type 2/obesity- A1c today was 5.6. This is significant improvement with only the addition of metformin. Since that former cigarette weight loss trouble we will continue to keep patient on this current dose. Discussed approval of going forth with gastric sleeve. Will followup in 3 months. Foot Exam done today and normal. Patient will get flu shot at work. Pneumonia vaccine was given today. Urine microalbumin under 30 controlled. Encouraged ophthalmologist appt within year.   Hypertension-second blood pressure was much better than the first. Patient is not on medication for the last couple days. Encouraged patient to take medication regularly. Continue to monitor and if not decreasing below 140/90 to call office. Patient declines any refills today.  Spent 30 minutes with patient greater than 50% of encounters to counseling patient regarding decision of weight loss surgery or not.

## 2013-04-05 ENCOUNTER — Telehealth: Payer: Self-pay | Admitting: *Deleted

## 2013-04-05 NOTE — Telephone Encounter (Signed)
Pt left message asking if you had received paperwork for her

## 2013-04-06 NOTE — Telephone Encounter (Signed)
What paperwork?  I have not seen any.

## 2013-04-15 ENCOUNTER — Encounter: Payer: 59 | Attending: General Surgery

## 2013-04-15 DIAGNOSIS — E669 Obesity, unspecified: Secondary | ICD-10-CM | POA: Insufficient documentation

## 2013-04-15 DIAGNOSIS — Z01818 Encounter for other preprocedural examination: Secondary | ICD-10-CM | POA: Insufficient documentation

## 2013-04-16 NOTE — Progress Notes (Signed)
Pre-Operative Nursing and Nutrition Class:  Appt start time: 1730   End time:  1930. Patient was seen on 04/15/13 for Pre-Operative Bariatric Surgery Education at the [facility].  Surgery date: unknown Surgery type: Gastric Sleeve The following the learning objectives were met by the patient during this course: Identify Pre-Op Dietary Goals and will begin 2 weeks pre-operatively Identify appropriate sources of fluids and proteins   State protein recommendations and appropriate sources pre and post-operatively Identify Post-Operative Dietary Goals and will follow for 2 weeks post-operatively Identify appropriate multivitamin and calcium sources Describe the need for physical activity post-operatively and will follow MD recommendations State when to call healthcare provider regarding medication questions or post-operative complications Handouts given during class include: Pre-Op Bariatric Surgery Diet Handout Protein Shake Handout Post-Op Bariatric Surgery Nutrition Handout BELT Program Information Flyer Support Group Information Flyer [facility] Bariatric Supplements Price List Follow-Up Plan: Patient will follow-up at [facility] 2 weeks post operatively for diet advancement per MD. Patient Instructions Follow:  Pre-Op Diet per MD 2 weeks prior to surgery   Phase 2- Liquids (clear/full) 2 weeks after surgery   Vitamin/Mineral/Calcium guidelines for purchasing bariatric supplements   Exercise guidelines pre and post-op per MD Follow-up at [facility] in 2 weeks post-op for diet advancement. Contact [facility] at [phone] or [name] [phone] [email or web address] with any questions or concerns Follow-up and Disposition    Return in about 2 weeks after surgery for 2 Week Post-Op Class.

## 2013-04-28 NOTE — Progress Notes (Signed)
Need orders in EPIC.  Surgery scheduled for 05/14/13.  Thank You.

## 2013-05-02 ENCOUNTER — Other Ambulatory Visit: Payer: Self-pay | Admitting: Physician Assistant

## 2013-05-06 ENCOUNTER — Ambulatory Visit (INDEPENDENT_AMBULATORY_CARE_PROVIDER_SITE_OTHER): Payer: 59 | Admitting: Surgery

## 2013-05-06 ENCOUNTER — Encounter (INDEPENDENT_AMBULATORY_CARE_PROVIDER_SITE_OTHER): Payer: Self-pay | Admitting: Surgery

## 2013-05-06 ENCOUNTER — Encounter (HOSPITAL_COMMUNITY): Payer: Self-pay | Admitting: Pharmacy Technician

## 2013-05-06 DIAGNOSIS — E119 Type 2 diabetes mellitus without complications: Secondary | ICD-10-CM

## 2013-05-06 NOTE — Patient Instructions (Signed)

## 2013-05-06 NOTE — Progress Notes (Signed)
Patient ID: Amanda Davidson, female   DOB: 09-02-1970, 42 y.o.   MRN: [id number]  Chief Complaint  Patient presents with  . Bariatric Pre-op    gastric sleeve    HPI Amanda Davidson is a 42 y.o. female.  This patient presents today for her initial weight loss surgery evaluation. She has a tender information session and comes in today interested in the vertical sleeve gastrectomy. She has a BMI of 47.5 with obesity related comorbidities of diabetes mellitus, hypertension, depression, and asthma. She has trouble with her weight ever since the birth of her children and she put on several pounds during her pregnancy but was unable to lose the weight after the birth. She has tried several diets including Weight Watchers and hCG shots and is currently on Belviq for weight loss.  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 She denies any significant reflux. She does have a history of umbilical hernia repair with mesh but she says that she has a recurrence just above her umbilicus as well. Otherwise she is current on her mammogram and routine health maintenance.  Her upper GI series was normal.  Her main problems are DM and this recurrent umbilical hernia where she had mesh placed.  I explained the procedure to her and we are set to do this surgery on Friday, Dec 12.    HPI  Past Medical History  Diagnosis Date  . Hypertension   . Obesity   . Thyroid disease     hypo  . Exercise-induced asthma   . Diabetes mellitus without complication   . Sleep apnea     Past Surgical History  Procedure Laterality Date  . Hernia repair      Family History  Problem Relation Age of Onset  . Hypertension Mother   . Hypertension Father   . Diabetes Father   . Heart disease Maternal Grandfather   . Heart attack Maternal Grandfather   . Cancer Maternal Grandfather     bladder  . Hyperlipidemia Other   . Sleep apnea Other     Social  History History  Substance Use Topics  . Smoking status: Never Smoker   . Smokeless tobacco: Never Used  . Alcohol Use: 0.5 oz/week    1 drink(s) per week     Comment: 2 glasses wine @ 2x/month    No Known Allergies  Current Outpatient Prescriptions  Medication Sig Dispense Refill  . albuterol (PROVENTIL HFA;VENTOLIN HFA) 108 (90 BASE) MCG/ACT inhaler Inhale 2 puffs into the lungs every 6 (six) hours as needed for wheezing.  1 Inhaler  3  . budesonide-formoterol (SYMBICORT) 80-4.5 MCG/ACT inhaler Inhale 2 puffs into the lungs 2 (two) times daily.  1 Inhaler  6  . buPROPion (WELLBUTRIN XL) 300 MG 24 hr tablet TAKE 1 TABLET (300 MG TOTAL) BY MOUTH DAILY.  30 tablet  5  . levothyroxine (SYNTHROID, LEVOTHROID) 137 MCG tablet Take 137 mcg by mouth daily before breakfast.      . losartan-hydrochlorothiazide (HYZAAR) 50-12.5 MG per tablet TAKE 1 TABLET BY MOUTH ONCE PER DAY  30 tablet  2  . metFORMIN (GLUCOPHAGE) 1000 MG tablet Take 1 tablet (1,000 mg total) by mouth 2 (two) times daily with a meal.  60 tablet  5  . montelukast (SINGULAIR) 10 MG tablet Take 1 tablet (10 mg total) by mouth at bedtime.  30 tablet  6  . SUMAtriptan (IMITREX) 100 MG  tablet TAKE 1 TABLET (100 MG TOTAL) BY MOUTH EVERY 2 (TWO) HOURS AS NEEDED.  10 tablet  0   No current facility-administered medications for this visit.    Review of Systems Review of Systems All other review of systems negative or noncontributory except as stated in the HPI  Blood pressure 124/84, pulse 88, temperature 97.4 F (36.3 C), temperature source Temporal, resp. rate 16, height 5\' 6"  (1.676 m), weight 284 lb (128.822 kg).  Physical Exam Physical Exam Physical Exam  Nursing note and vitals reviewed. Constitutional: She is oriented to person, place, and time. She appears well-developed and well-nourished. No distress.  HENT:  Head: Normocephalic and atraumatic.  Mouth/Throat: No oropharyngeal exudate.  Eyes: Conjunctivae and EOM are  normal. Pupils are equal, round, and reactive to light. Right eye exhibits no discharge. Left eye exhibits no discharge. No scleral icterus.  Neck: Normal range of motion. Neck supple. No tracheal deviation present.  Cardiovascular: Normal rate, regular rhythm, normal heart sounds and intact distal pulses.   Pulmonary/Chest: Effort normal and breath sounds normal. No stridor. No respiratory distress. She has no wheezes.  Abdominal: Soft. Bowel sounds are normal. She exhibits no distension and no mass. There is no tenderness. There is no rebound and no guarding. She does have a palpable bulge just above and to the right of her umbilicus which is likely a recurrent umbilical hernia Musculoskeletal: Normal range of motion. She exhibits no edema and no tenderness.  Neurological: She is alert and oriented to person, place, and time.  Skin: Skin is warm and dry. No rash noted. She is not diaphoretic. No erythema. No pallor.  Psychiatric: She has a normal mood and affect. Her behavior is normal. Judgment and thought content normal.    Data Reviewed UGI normal   Assessment    Morbid obesity with a BMI of 45.84 and obesity related comorbidities of diabetes mellitus, hypertension, depression, asthma, and hypothyroidism She is interested in weight loss surgery and seems to have failed medical attempts. I think that she would be a fine candidate for any of the options which she chooses. We did discuss with her the options for medical and surgical weight loss including the options were that band, sleeve gastrectomy, and Roux-en-Y gastric bypass. She remains interested in the sleeve gastrectomy and we discussed the procedure and its risks and benefits.  The risks of infection, bleeding, pain, scarring, weight regain, too little or too much weight loss, vitamin deficiencies and need for lifelong vitamin supplementation, hair loss, need for protein supplementation, leaks, stricture, reflux, food intolerance, need  for reoperation and conversion to roux Y gastric bypass, need for open surgery, injury to spleen or surrounding structures, DVT's, PE, and death again discussed with the patient and the patient expressed understanding and desires to proceed with laparoscopic vertical sleeve gastrectomy, possible open, intraoperative endoscopy. She also has a umbilical hernia which is recurrent and we discussed the possibility of simultaneous repair but I think that the best option for her would be to perform the weight loss surgery and subsequent hernia repair after she has lost weight in order to minimize recurrence.     Plan     Lap sleeve gastrectomy Friday, Dec 12th.        Breyden Jeudy B 05/06/2013, 5:17 PM

## 2013-05-07 ENCOUNTER — Encounter (HOSPITAL_COMMUNITY): Payer: Self-pay | Admitting: Pharmacy Technician

## 2013-05-10 ENCOUNTER — Other Ambulatory Visit (INDEPENDENT_AMBULATORY_CARE_PROVIDER_SITE_OTHER): Payer: Self-pay | Admitting: Surgery

## 2013-05-11 ENCOUNTER — Encounter (HOSPITAL_COMMUNITY)
Admission: RE | Admit: 2013-05-11 | Discharge: 2013-05-11 | Disposition: A | Discharge status: Home / Self Care | Payer: 59 | Source: Ambulatory Visit | Attending: Surgery | Admitting: Surgery

## 2013-05-11 ENCOUNTER — Encounter (HOSPITAL_COMMUNITY): Payer: Self-pay

## 2013-05-11 DIAGNOSIS — Z01812 Encounter for preprocedural laboratory examination: Secondary | ICD-10-CM | POA: Insufficient documentation

## 2013-05-11 DIAGNOSIS — Z01818 Encounter for other preprocedural examination: Secondary | ICD-10-CM | POA: Insufficient documentation

## 2013-05-11 HISTORY — DX: Headache: R51

## 2013-05-11 HISTORY — DX: Postnasal drip: R09.82

## 2013-05-11 HISTORY — DX: Hypothyroidism, unspecified: E03.9

## 2013-05-11 HISTORY — DX: Adverse effect of unspecified anesthetic, initial encounter: T41.45XA

## 2013-05-11 HISTORY — DX: Other complications of anesthesia, initial encounter: T88.59XA

## 2013-05-11 LAB — CBC WITH DIFFERENTIAL/PLATELET
Basophils Absolute: 0 10*3/uL (ref 0.0–0.1)
Eosinophils Relative: 1 % (ref 0–5)
HCT: 39.9 % (ref 36.0–46.0)
Hemoglobin: 13 g/dL (ref 12.0–15.0)
Lymphocytes Relative: 22 % (ref 12–46)
MCV: 87.7 fL (ref 78.0–100.0)
Monocytes Absolute: 0.6 10*3/uL (ref 0.1–1.0)
Monocytes Relative: 6 % (ref 3–12)
Neutro Abs: 7.3 10*3/uL (ref 1.7–7.7)
Platelets: 355 10*3/uL (ref 150–400)
RDW: 13.9 % (ref 11.5–15.5)
WBC: 10.3 10*3/uL (ref 4.0–10.5)

## 2013-05-11 LAB — COMPREHENSIVE METABOLIC PANEL
AST: 37 U/L (ref 0–37)
Alkaline Phosphatase: 99 U/L (ref 39–117)
BUN: 11 mg/dL (ref 6–23)
CO2: 27 mEq/L (ref 19–32)
Calcium: 9.8 mg/dL (ref 8.4–10.5)
Chloride: 97 mEq/L (ref 96–112)
Creatinine, Ser: 0.81 mg/dL (ref 0.50–1.10)
GFR calc Af Amer: >90 mL/min (ref >90)
GFR calc non Af Amer: 87 mL/min — ABNORMAL LOW (ref >90)
Glucose, Bld: 102 mg/dL — ABNORMAL HIGH (ref 70–99)
Potassium: 4.2 mEq/L (ref 3.5–5.1)
Total Bilirubin: 0.5 mg/dL (ref 0.3–1.2)

## 2013-05-11 NOTE — Patient Instructions (Addendum)
Kween Gelin  05/11/2013                           YOUR PROCEDURE IS SCHEDULED ON:  05/14/13               PLEASE REPORT TO SHORT STAY CENTER AT :  5:30 AM               CALL THIS NUMBER IF ANY PROBLEMS THE DAY OF SURGERY :               [phone]                      REMEMBER:   Do not eat food or drink liquids AFTER MIDNIGHT   Take these medicines the morning of surgery with A SIP OF WATER: LEVOTHYROXINE / USE SIMBICORT / MAY USE ALBUTEROL IF NEEDED   Do not wear jewelry, make-up   Do not wear lotions, powders, or perfumes.   Do not shave legs or underarms 12 hrs. before surgery (men may shave face)  Do not bring valuables to the hospital.  Contacts, dentures or bridgework may not be worn into surgery.  Leave suitcase in the car. After surgery it may be brought to your room.  For patients admitted to the hospital more than one night, checkout time is 11:00                          The day of discharge.   Patients discharged the day of surgery will not be allowed to drive home                             If going home same day of surgery, must have someone stay with you first                           24 hrs at home and arrange for some one to drive you home from hospital.    Special Instructions:   Please read over the following fact sheets that you were given:               1. STOP ASPIRIN PRODUCTS / HERBAL MEDS 7 DAYS PREOP                      2. [place] PREPARING FOR SURGERY SHEET               3. FOLLOW BOWEL PREP               4. INCENTIVE SPIROMETER               5. BRING C-PAP MASK AND TUBING TO HOSPITAL                                                X_____________________________________________________________________        Failure to follow these instructions may result in cancellation of your surgery

## 2013-05-13 NOTE — Anesthesia Preprocedure Evaluation (Addendum)
Anesthesia Evaluation  Patient identified by MRN, date of birth, ID band Patient awake    Reviewed: Allergy & Precautions, H&P , NPO status , Patient's Chart, lab work & pertinent test results  History of Anesthesia Complications (+) PONV  Airway Mallampati: II TM Distance: >3 FB Neck ROM: Full    Dental no notable dental hx.    Pulmonary asthma , sleep apnea (No longer uses CPAP) ,  breath sounds clear to auscultation  Pulmonary exam normal       Cardiovascular hypertension, Pt. on medications Rhythm:Regular Rate:Normal     Neuro/Psych  Headaches, Anxiety Depression negative psych ROS   GI/Hepatic negative GI ROS, Neg liver ROS,   Endo/Other  diabetes, Type 2, Oral Hypoglycemic AgentsHypothyroidism Morbid obesity  Renal/GU negative Renal ROS  negative genitourinary   Musculoskeletal negative musculoskeletal ROS (+)   Abdominal   Peds  Hematology negative hematology ROS (+)   Anesthesia Other Findings   Reproductive/Obstetrics                        Anesthesia Physical Anesthesia Plan  ASA: III  Anesthesia Plan: General   Post-op Pain Management:    Induction: Intravenous  Airway Management Planned: Oral ETT  Additional Equipment:   Intra-op Plan:   Post-operative Plan: Extubation in OR  Informed Consent: I have reviewed the patients History and Physical, chart, labs and discussed the procedure including the risks, benefits and alternatives for the proposed anesthesia with the patient or authorized representative who has indicated his/her understanding and acceptance.   Dental advisory given  Plan Discussed with: CRNA  Anesthesia Plan Comments:         Anesthesia Quick Evaluation

## 2013-05-14 ENCOUNTER — Inpatient Hospital Stay (HOSPITAL_COMMUNITY)
Admission: RE | Admit: 2013-05-14 | Discharge: 2013-05-16 | DRG: 621 | Disposition: A | Discharge status: Home / Self Care | Payer: 59 | Source: Ambulatory Visit | Attending: Surgery | Admitting: Surgery

## 2013-05-14 ENCOUNTER — Inpatient Hospital Stay (HOSPITAL_COMMUNITY): Payer: 59 | Admitting: Anesthesiology

## 2013-05-14 ENCOUNTER — Encounter (HOSPITAL_COMMUNITY): Payer: Self-pay | Admitting: *Deleted

## 2013-05-14 ENCOUNTER — Encounter (HOSPITAL_COMMUNITY)
Admission: RE | Disposition: A | Discharge status: Home / Self Care | Payer: Self-pay | Source: Ambulatory Visit | Attending: Surgery

## 2013-05-14 ENCOUNTER — Encounter (HOSPITAL_COMMUNITY): Payer: 59 | Admitting: Anesthesiology

## 2013-05-14 DIAGNOSIS — Z9884 Bariatric surgery status: Secondary | ICD-10-CM

## 2013-05-14 DIAGNOSIS — F418 Other specified anxiety disorders: Secondary | ICD-10-CM | POA: Diagnosis present

## 2013-05-14 DIAGNOSIS — J45909 Unspecified asthma, uncomplicated: Secondary | ICD-10-CM | POA: Diagnosis present

## 2013-05-14 DIAGNOSIS — Z8249 Family history of ischemic heart disease and other diseases of the circulatory system: Secondary | ICD-10-CM

## 2013-05-14 DIAGNOSIS — E559 Vitamin D deficiency, unspecified: Secondary | ICD-10-CM | POA: Diagnosis present

## 2013-05-14 DIAGNOSIS — F3289 Other specified depressive episodes: Secondary | ICD-10-CM | POA: Diagnosis present

## 2013-05-14 DIAGNOSIS — E119 Type 2 diabetes mellitus without complications: Secondary | ICD-10-CM | POA: Diagnosis present

## 2013-05-14 DIAGNOSIS — F329 Major depressive disorder, single episode, unspecified: Secondary | ICD-10-CM | POA: Diagnosis present

## 2013-05-14 DIAGNOSIS — Z6841 Body Mass Index (BMI) 40.0 and over, adult: Secondary | ICD-10-CM

## 2013-05-14 DIAGNOSIS — K429 Umbilical hernia without obstruction or gangrene: Secondary | ICD-10-CM | POA: Diagnosis present

## 2013-05-14 DIAGNOSIS — I10 Essential (primary) hypertension: Secondary | ICD-10-CM | POA: Diagnosis present

## 2013-05-14 DIAGNOSIS — F411 Generalized anxiety disorder: Secondary | ICD-10-CM | POA: Diagnosis present

## 2013-05-14 DIAGNOSIS — E039 Hypothyroidism, unspecified: Secondary | ICD-10-CM | POA: Diagnosis present

## 2013-05-14 DIAGNOSIS — G4730 Sleep apnea, unspecified: Secondary | ICD-10-CM | POA: Diagnosis present

## 2013-05-14 DIAGNOSIS — G4733 Obstructive sleep apnea (adult) (pediatric): Secondary | ICD-10-CM | POA: Diagnosis present

## 2013-05-14 DIAGNOSIS — Z833 Family history of diabetes mellitus: Secondary | ICD-10-CM

## 2013-05-14 DIAGNOSIS — Z79899 Other long term (current) drug therapy: Secondary | ICD-10-CM

## 2013-05-14 HISTORY — PX: UPPER GI ENDOSCOPY: SHX6162

## 2013-05-14 HISTORY — PX: UMBILICAL HERNIA REPAIR: SHX196

## 2013-05-14 HISTORY — PX: LAPAROSCOPIC GASTRIC SLEEVE RESECTION: SHX5895

## 2013-05-14 LAB — CREATININE, SERUM
Creatinine, Ser: 0.81 mg/dL (ref 0.50–1.10)
GFR calc Af Amer: >90 mL/min (ref >90)
GFR calc non Af Amer: 87 mL/min — ABNORMAL LOW (ref >90)

## 2013-05-14 LAB — GLUCOSE, CAPILLARY
Glucose-Capillary: 166 mg/dL — ABNORMAL HIGH (ref 70–99)
Glucose-Capillary: 137 mg/dL — ABNORMAL HIGH (ref 70–99)

## 2013-05-14 LAB — CBC
HCT: 37.2 % (ref 36.0–46.0)
Hemoglobin: 12.3 g/dL (ref 12.0–15.0)
MCH: 29.3 pg (ref 26.0–34.0)
MCHC: 33.1 g/dL (ref 30.0–36.0)
RBC: 4.2 MIL/uL (ref 3.87–5.11)
RDW: 13.9 % (ref 11.5–15.5)
WBC: 12.8 10*3/uL — ABNORMAL HIGH (ref 4.0–10.5)

## 2013-05-14 LAB — HEMOGLOBIN A1C
Hgb A1c MFr Bld: 5.5 % (ref <5.7)
Mean Plasma Glucose: 111 mg/dL (ref <117)

## 2013-05-14 SURGERY — GASTRECTOMY, SLEEVE, LAPAROSCOPIC
Anesthesia: General | Site: Esophagus

## 2013-05-14 MED ORDER — PROMETHAZINE HCL 25 MG/ML IJ SOLN: 6.2500 mg | INTRAMUSCULAR | Status: DC | PRN

## 2013-05-14 MED ORDER — LACTATED RINGERS IV SOLN
INTRAVENOUS | Status: DC | PRN
  Administered 2013-05-14 (×3): via INTRAVENOUS

## 2013-05-14 MED ORDER — LACTATED RINGERS IV SOLN: INTRAVENOUS | Status: DC

## 2013-05-14 MED ORDER — FENTANYL CITRATE 0.05 MG/ML IJ SOLN
25.0000 ug | INTRAMUSCULAR | Status: DC | PRN
  Administered 2013-05-14 (×4): 25 ug via INTRAVENOUS

## 2013-05-14 MED ORDER — PROPOFOL 10 MG/ML IV BOLUS
INTRAVENOUS | Status: AC
Start: 2013-05-14 — End: 2013-05-14
  Filled 2013-05-14: qty 20

## 2013-05-14 MED ORDER — DEXTROSE 5 % IV SOLN
INTRAVENOUS | Status: AC
  Filled 2013-05-14 (×2): qty 1

## 2013-05-14 MED ORDER — ONDANSETRON HCL 4 MG/2ML IJ SOLN
4.0000 mg | INTRAMUSCULAR | Status: DC | PRN
  Administered 2013-05-14: 4 mg via INTRAVENOUS
  Filled 2013-05-14: qty 2

## 2013-05-14 MED ORDER — FENTANYL CITRATE 0.05 MG/ML IJ SOLN
INTRAMUSCULAR | Status: DC | PRN
  Administered 2013-05-14 (×2): 50 ug via INTRAVENOUS
  Administered 2013-05-14: 150 ug via INTRAVENOUS
  Administered 2013-05-14: 50 ug via INTRAVENOUS
  Administered 2013-05-14 (×2): 100 ug via INTRAVENOUS

## 2013-05-14 MED ORDER — GLYCOPYRROLATE 0.2 MG/ML IJ SOLN
INTRAMUSCULAR | Status: DC | PRN
  Administered 2013-05-14: 0.6 mg via INTRAVENOUS

## 2013-05-14 MED ORDER — LIDOCAINE HCL (CARDIAC) 20 MG/ML IV SOLN
INTRAVENOUS | Status: AC
  Filled 2013-05-14: qty 5

## 2013-05-14 MED ORDER — HEPARIN SODIUM (PORCINE) 5000 UNIT/ML IJ SOLN
5000.0000 [IU] | Freq: Three times a day (TID) | INTRAMUSCULAR | Status: DC
  Administered 2013-05-14 – 2013-05-16 (×5): 5000 [IU] via SUBCUTANEOUS
  Filled 2013-05-14 (×9): qty 1

## 2013-05-14 MED ORDER — BUDESONIDE-FORMOTEROL FUMARATE 80-4.5 MCG/ACT IN AERO
2.0000 | INHALATION_SPRAY | Freq: Two times a day (BID) | RESPIRATORY_TRACT | Status: DC
  Administered 2013-05-14 – 2013-05-16 (×4): 2 via RESPIRATORY_TRACT
  Filled 2013-05-14: qty 6.9

## 2013-05-14 MED ORDER — HEPARIN SODIUM (PORCINE) 5000 UNIT/ML IJ SOLN
5000.0000 [IU] | INTRAMUSCULAR | Status: AC
  Administered 2013-05-14: 5000 [IU] via SUBCUTANEOUS
  Filled 2013-05-14: qty 1

## 2013-05-14 MED ORDER — MIDAZOLAM HCL 2 MG/2ML IJ SOLN
INTRAMUSCULAR | Status: AC
  Filled 2013-05-14: qty 2

## 2013-05-14 MED ORDER — DEXTROSE 5 % IV SOLN
2.0000 g | INTRAVENOUS | Status: AC
  Administered 2013-05-14: 2 g via INTRAVENOUS
  Filled 2013-05-14: qty 2

## 2013-05-14 MED ORDER — EPHEDRINE SULFATE 50 MG/ML IJ SOLN
INTRAMUSCULAR | Status: AC
  Filled 2013-05-14: qty 1

## 2013-05-14 MED ORDER — ROCURONIUM BROMIDE 100 MG/10ML IV SOLN
INTRAVENOUS | Status: DC | PRN
  Administered 2013-05-14 (×2): 20 mg via INTRAVENOUS
  Administered 2013-05-14: 10 mg via INTRAVENOUS
  Administered 2013-05-14: 70 mg via INTRAVENOUS

## 2013-05-14 MED ORDER — ONDANSETRON HCL 4 MG/2ML IJ SOLN
INTRAMUSCULAR | Status: AC
  Filled 2013-05-14: qty 2

## 2013-05-14 MED ORDER — SCOPOLAMINE 1 MG/3DAYS TD PT72
MEDICATED_PATCH | TRANSDERMAL | Status: DC | PRN
  Administered 2013-05-14: 1.5 mg via TRANSDERMAL

## 2013-05-14 MED ORDER — NEOSTIGMINE METHYLSULFATE 1 MG/ML IJ SOLN
INTRAMUSCULAR | Status: DC | PRN
  Administered 2013-05-14: 5 mg via INTRAVENOUS

## 2013-05-14 MED ORDER — SUCCINYLCHOLINE CHLORIDE 20 MG/ML IJ SOLN
INTRAMUSCULAR | Status: AC
  Filled 2013-05-14: qty 1

## 2013-05-14 MED ORDER — GLYCOPYRROLATE 0.2 MG/ML IJ SOLN
INTRAMUSCULAR | Status: AC
  Filled 2013-05-14: qty 3

## 2013-05-14 MED ORDER — INSULIN GLARGINE 100 UNIT/ML ~~LOC~~ SOLN
10.0000 [IU] | Freq: Every day | SUBCUTANEOUS | Status: DC
Start: 2013-05-14 — End: 2013-05-16
  Administered 2013-05-14 – 2013-05-15 (×2): 10 [IU] via SUBCUTANEOUS
  Filled 2013-05-14 (×3): qty 0.1

## 2013-05-14 MED ORDER — MORPHINE SULFATE 2 MG/ML IJ SOLN
2.0000 mg | INTRAMUSCULAR | Status: DC | PRN
  Administered 2013-05-14 – 2013-05-15 (×5): 4 mg via INTRAVENOUS
  Administered 2013-05-15: 2 mg via INTRAVENOUS
  Administered 2013-05-15: 4 mg via INTRAVENOUS
  Filled 2013-05-14 (×6): qty 2
  Filled 2013-05-14: qty 1

## 2013-05-14 MED ORDER — UNJURY CHICKEN SOUP POWDER: 2.0000 [oz_av] | Freq: Four times a day (QID) | ORAL | Status: DC

## 2013-05-14 MED ORDER — BUPIVACAINE LIPOSOME 1.3 % IJ SUSP
INTRAMUSCULAR | Status: DC | PRN
  Administered 2013-05-14: 20 mL

## 2013-05-14 MED ORDER — TISSEEL VH 10 ML EX KIT
PACK | CUTANEOUS | Status: DC | PRN
  Administered 2013-05-14: 1

## 2013-05-14 MED ORDER — UNJURY VANILLA POWDER: 2.0000 [oz_av] | Freq: Four times a day (QID) | ORAL | Status: DC

## 2013-05-14 MED ORDER — ONDANSETRON HCL 4 MG/2ML IJ SOLN
INTRAMUSCULAR | Status: DC | PRN
  Administered 2013-05-14: 4 mg via INTRAVENOUS

## 2013-05-14 MED ORDER — UNJURY CHOCOLATE CLASSIC POWDER
2.0000 [oz_av] | Freq: Four times a day (QID) | ORAL | Status: DC
  Administered 2013-05-16: 2 [oz_av] via ORAL

## 2013-05-14 MED ORDER — DEXAMETHASONE SODIUM PHOSPHATE 10 MG/ML IJ SOLN
INTRAMUSCULAR | Status: DC | PRN
  Administered 2013-05-14: 5 mg via INTRAVENOUS

## 2013-05-14 MED ORDER — DEXAMETHASONE SODIUM PHOSPHATE 10 MG/ML IJ SOLN
INTRAMUSCULAR | Status: AC
  Filled 2013-05-14: qty 1

## 2013-05-14 MED ORDER — FENTANYL CITRATE 0.05 MG/ML IJ SOLN
INTRAMUSCULAR | Status: AC
  Filled 2013-05-14: qty 2

## 2013-05-14 MED ORDER — LACTATED RINGERS IR SOLN
Status: DC | PRN
  Administered 2013-05-14: 3000 mL

## 2013-05-14 MED ORDER — ACETAMINOPHEN 160 MG/5ML PO SOLN: 650.0000 mg | ORAL | Status: DC | PRN

## 2013-05-14 MED ORDER — ROCURONIUM BROMIDE 100 MG/10ML IV SOLN
INTRAVENOUS | Status: AC
Start: 2013-05-14 — End: 2013-05-14
  Filled 2013-05-14: qty 1

## 2013-05-14 MED ORDER — 0.9 % SODIUM CHLORIDE (POUR BTL) OPTIME
TOPICAL | Status: DC | PRN
  Administered 2013-05-14: 1000 mL

## 2013-05-14 MED ORDER — SODIUM CHLORIDE 0.9 % IJ SOLN
INTRAMUSCULAR | Status: AC
  Filled 2013-05-14: qty 10

## 2013-05-14 MED ORDER — INSULIN ASPART 100 UNIT/ML ~~LOC~~ SOLN
0.0000 [IU] | SUBCUTANEOUS | Status: DC
  Administered 2013-05-14 – 2013-05-15 (×5): 3 [IU] via SUBCUTANEOUS

## 2013-05-14 MED ORDER — FENTANYL CITRATE 0.05 MG/ML IJ SOLN
INTRAMUSCULAR | Status: AC
  Filled 2013-05-14: qty 5

## 2013-05-14 MED ORDER — PROPOFOL 10 MG/ML IV BOLUS
INTRAVENOUS | Status: DC | PRN
  Administered 2013-05-14: 200 mg via INTRAVENOUS

## 2013-05-14 MED ORDER — SCOPOLAMINE 1 MG/3DAYS TD PT72
MEDICATED_PATCH | TRANSDERMAL | Status: AC
Start: 2013-05-14 — End: 2013-05-14
  Filled 2013-05-14: qty 1

## 2013-05-14 MED ORDER — LIDOCAINE HCL (PF) 2 % IJ SOLN
INTRAMUSCULAR | Status: DC | PRN
  Administered 2013-05-14: 75 mg via INTRADERMAL

## 2013-05-14 MED ORDER — BUPIVACAINE LIPOSOME 1.3 % IJ SUSP
20.0000 mL | Freq: Once | INTRAMUSCULAR | Status: DC
  Filled 2013-05-14 (×2): qty 20

## 2013-05-14 MED ORDER — MEPERIDINE HCL 50 MG/ML IJ SOLN: 6.2500 mg | INTRAMUSCULAR | Status: DC | PRN

## 2013-05-14 MED ORDER — DEXTROSE 5 % IV SOLN
2.0000 g | Freq: Once | INTRAVENOUS | Status: AC
  Administered 2013-05-14: 2 g via INTRAVENOUS
  Filled 2013-05-14: qty 2

## 2013-05-14 MED ORDER — KCL IN DEXTROSE-NACL 20-5-0.45 MEQ/L-%-% IV SOLN
INTRAVENOUS | Status: DC
  Administered 2013-05-14 – 2013-05-16 (×3): via INTRAVENOUS
  Filled 2013-05-14 (×6): qty 1000

## 2013-05-14 MED ORDER — ALBUTEROL SULFATE HFA 108 (90 BASE) MCG/ACT IN AERS
2.0000 | INHALATION_SPRAY | Freq: Four times a day (QID) | RESPIRATORY_TRACT | Status: DC | PRN
  Filled 2013-05-14: qty 6.7

## 2013-05-14 MED ORDER — ROCURONIUM BROMIDE 100 MG/10ML IV SOLN
INTRAVENOUS | Status: AC
  Filled 2013-05-14: qty 1

## 2013-05-14 MED ORDER — TISSEEL VH 10 ML EX KIT
PACK | CUTANEOUS | Status: AC
  Filled 2013-05-14: qty 2

## 2013-05-14 MED ORDER — MIDAZOLAM HCL 5 MG/5ML IJ SOLN
INTRAMUSCULAR | Status: DC | PRN
  Administered 2013-05-14: 2 mg via INTRAVENOUS

## 2013-05-14 MED ORDER — ATROPINE SULFATE 0.4 MG/ML IJ SOLN
INTRAMUSCULAR | Status: AC
  Filled 2013-05-14: qty 1

## 2013-05-14 MED ORDER — OXYCODONE-ACETAMINOPHEN 5-325 MG/5ML PO SOLN
5.0000 mL | ORAL | Status: DC | PRN
  Administered 2013-05-15 – 2013-05-16 (×3): 10 mL via ORAL
  Filled 2013-05-14 (×3): qty 10

## 2013-05-14 SURGICAL SUPPLY — 60 items
ADH SKN CLS APL DERMABOND .7 (GAUZE/BANDAGES/DRESSINGS) ×3
APL SRG 32X5 SNPLK LF DISP (MISCELLANEOUS) ×3
APPLICATOR COTTON TIP 6IN STRL (MISCELLANEOUS) ×2 IMPLANT
APPLIER CLIP ROT 10 11.4 M/L (STAPLE)
APR CLP MED LRG 11.4X10 (STAPLE)
BLADE HEX COATED 2.75 (ELECTRODE) ×4 IMPLANT
BLADE SURG 15 STRL LF DISP TIS (BLADE) ×3 IMPLANT
BLADE SURG 15 STRL SS (BLADE) ×4
CABLE HIGH FREQUENCY MONO STRZ (ELECTRODE) ×1 IMPLANT
CLIP APPLIE ROT 10 11.4 M/L (STAPLE) IMPLANT
DERMABOND ADVANCED (GAUZE/BANDAGES/DRESSINGS) ×1
DERMABOND ADVANCED .7 DNX12 (GAUZE/BANDAGES/DRESSINGS) IMPLANT
DEVICE SUT QUICK LOAD TK 5 (STAPLE) IMPLANT
DEVICE SUT TI-KNOT TK 5X26 (MISCELLANEOUS) IMPLANT
DEVICE SUTURE ENDOST 10MM (ENDOMECHANICALS) IMPLANT
DEVICE TROCAR PUNCTURE CLOSURE (ENDOMECHANICALS) IMPLANT
DRAIN CHANNEL 19F RND (DRAIN) IMPLANT
DRAPE CAMERA CLOSED 9X96 (DRAPES) ×4 IMPLANT
ELECT REM PT RETURN 9FT ADLT (ELECTROSURGICAL) ×4
ELECTRODE REM PT RTRN 9FT ADLT (ELECTROSURGICAL) ×3 IMPLANT
EVACUATOR SILICONE 100CC (DRAIN) IMPLANT
GLOVE BIOGEL M 8.0 STRL (GLOVE) ×4 IMPLANT
GOWN STRL REIN XL XLG (GOWN DISPOSABLE) ×18 IMPLANT
HOVERMATT SINGLE USE (MISCELLANEOUS) ×4 IMPLANT
KIT BASIN OR (CUSTOM PROCEDURE TRAY) ×4 IMPLANT
NDL SPNL 22GX3.5 QUINCKE BK (NEEDLE) ×2 IMPLANT
NEEDLE SPNL 22GX3.5 QUINCKE BK (NEEDLE) ×4 IMPLANT
PACK UNIVERSAL I (CUSTOM PROCEDURE TRAY) ×4 IMPLANT
PENCIL BUTTON HOLSTER BLD 10FT (ELECTRODE) ×2 IMPLANT
RELOAD BLUE (STAPLE) ×12 IMPLANT
RELOAD ENDO STITCH (ENDOMECHANICALS) IMPLANT
RELOAD GREEN (STAPLE) ×8 IMPLANT
RELOAD SUT TRIPLE-STITCH 2-0 (ENDOMECHANICALS) IMPLANT
SCISSORS LAP 5X45 EPIX DISP (ENDOMECHANICALS) ×2 IMPLANT
SCRUB PCMX 4 OZ (MISCELLANEOUS) ×7 IMPLANT
SEALANT SURGICAL APPL DUAL CAN (MISCELLANEOUS) ×4 IMPLANT
SET IRRIG TUBING LAPAROSCOPIC (IRRIGATION / IRRIGATOR) ×4 IMPLANT
SHEARS CURVED HARMONIC AC 45CM (MISCELLANEOUS) ×4 IMPLANT
SLEEVE ADV FIXATION 12X100MM (TROCAR) IMPLANT
SLEEVE ADV FIXATION 5X100MM (TROCAR) ×4 IMPLANT
SOLUTION ANTI FOG 6CC (MISCELLANEOUS) ×4 IMPLANT
SPONGE GAUZE 4X4 12PLY (GAUZE/BANDAGES/DRESSINGS) IMPLANT
SPONGE LAP 18X18 X RAY DECT (DISPOSABLE) ×4 IMPLANT
STAPLE ECHEON FLEX 60 POW ENDO (STAPLE) ×4 IMPLANT
SUT ETHILON 2 0 PS N (SUTURE) IMPLANT
SUT NOVA NAB DX-16 0-1 5-0 T12 (SUTURE) ×1 IMPLANT
SUT VIC AB 4-0 SH 18 (SUTURE) ×4 IMPLANT
SYR 20CC LL (SYRINGE) ×4 IMPLANT
SYR 50ML LL SCALE MARK (SYRINGE) ×4 IMPLANT
TOWEL OR 17X26 10 PK STRL BLUE (TOWEL DISPOSABLE) ×5 IMPLANT
TOWEL OR NON WOVEN STRL DISP B (DISPOSABLE) ×4 IMPLANT
TRAY FOLEY CATH 14FRSI W/METER (CATHETERS) ×4 IMPLANT
TROCAR ADV FIXATION 12X100MM (TROCAR) ×4 IMPLANT
TROCAR ADV FIXATION 5X100MM (TROCAR) ×4 IMPLANT
TROCAR BLADELESS 15MM (ENDOMECHANICALS) ×4 IMPLANT
TROCAR XCEL NON-BLD 5MMX100MML (ENDOMECHANICALS) ×4 IMPLANT
TUBING CONNECTING 10 (TUBING) ×4 IMPLANT
TUBING ENDO SMARTCAP (MISCELLANEOUS) ×3 IMPLANT
TUBING ENDO SMARTCAP PENTAX (MISCELLANEOUS) ×2 IMPLANT
TUBING FILTER THERMOFLATOR (ELECTROSURGICAL) ×4 IMPLANT

## 2013-05-14 NOTE — H&P (View-Only) (Signed)
Patient ID: Amanda Davidson, female   DOB: 04/04/1971, 42 y.o.   MRN: [id number]  Chief Complaint  Patient presents with  . Bariatric Pre-op    gastric sleeve    HPI Amanda Davidson is a 42 y.o. female.  This patient presents today for her initial weight loss surgery evaluation. She has a tender information session and comes in today interested in the vertical sleeve gastrectomy. She has a BMI of 47.5 with obesity related comorbidities of diabetes mellitus, hypertension, depression, and asthma. She has trouble with her weight ever since the birth of her children and she put on several pounds during her pregnancy but was unable to lose the weight after the birth. She has tried several diets including Weight Watchers and hCG shots and is currently on Belviq for weight loss.  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 She denies any significant reflux. She does have a history of umbilical hernia repair with mesh but she says that she has a recurrence just above her umbilicus as well. Otherwise she is current on her mammogram and routine health maintenance.  Her upper GI series was normal.  Her main problems are DM and this recurrent umbilical hernia where she had mesh placed.  I explained the procedure to her and we are set to do this surgery on Friday, Dec 12.    HPI  Past Medical History  Diagnosis Date  . Hypertension   . Obesity   . Thyroid disease     hypo  . Exercise-induced asthma   . Diabetes mellitus without complication   . Sleep apnea     Past Surgical History  Procedure Laterality Date  . Hernia repair      Family History  Problem Relation Age of Onset  . Hypertension Mother   . Hypertension Father   . Diabetes Father   . Heart disease Maternal Grandfather   . Heart attack Maternal Grandfather   . Cancer Maternal Grandfather     bladder  . Hyperlipidemia Other   . Sleep apnea Other     Social  History History  Substance Use Topics  . Smoking status: Never Smoker   . Smokeless tobacco: Never Used  . Alcohol Use: 0.5 oz/week    1 drink(s) per week     Comment: 2 glasses wine @ 2x/month    No Known Allergies  Current Outpatient Prescriptions  Medication Sig Dispense Refill  . albuterol (PROVENTIL HFA;VENTOLIN HFA) 108 (90 BASE) MCG/ACT inhaler Inhale 2 puffs into the lungs every 6 (six) hours as needed for wheezing.  1 Inhaler  3  . budesonide-formoterol (SYMBICORT) 80-4.5 MCG/ACT inhaler Inhale 2 puffs into the lungs 2 (two) times daily.  1 Inhaler  6  . buPROPion (WELLBUTRIN XL) 300 MG 24 hr tablet TAKE 1 TABLET (300 MG TOTAL) BY MOUTH DAILY.  30 tablet  5  . levothyroxine (SYNTHROID, LEVOTHROID) 137 MCG tablet Take 137 mcg by mouth daily before breakfast.      . losartan-hydrochlorothiazide (HYZAAR) 50-12.5 MG per tablet TAKE 1 TABLET BY MOUTH ONCE PER DAY  30 tablet  2  . metFORMIN (GLUCOPHAGE) 1000 MG tablet Take 1 tablet (1,000 mg total) by mouth 2 (two) times daily with a meal.  60 tablet  5  . montelukast (SINGULAIR) 10 MG tablet Take 1 tablet (10 mg total) by mouth at bedtime.  30 tablet  6  . SUMAtriptan (IMITREX) 100 MG   tablet TAKE 1 TABLET (100 MG TOTAL) BY MOUTH EVERY 2 (TWO) HOURS AS NEEDED.  10 tablet  0   No current facility-administered medications for this visit.    Review of Systems Review of Systems All other review of systems negative or noncontributory except as stated in the HPI  Blood pressure 124/84, pulse 88, temperature 97.4 F (36.3 C), temperature source Temporal, resp. rate 16, height 5' 6" (1.676 m), weight 284 lb (128.822 kg).  Physical Exam Physical Exam Physical Exam  Nursing note and vitals reviewed. Constitutional: She is oriented to person, place, and time. She appears well-developed and well-nourished. No distress.  HENT:  Head: Normocephalic and atraumatic.  Mouth/Throat: No oropharyngeal exudate.  Eyes: Conjunctivae and EOM are  normal. Pupils are equal, round, and reactive to light. Right eye exhibits no discharge. Left eye exhibits no discharge. No scleral icterus.  Neck: Normal range of motion. Neck supple. No tracheal deviation present.  Cardiovascular: Normal rate, regular rhythm, normal heart sounds and intact distal pulses.   Pulmonary/Chest: Effort normal and breath sounds normal. No stridor. No respiratory distress. She has no wheezes.  Abdominal: Soft. Bowel sounds are normal. She exhibits no distension and no mass. There is no tenderness. There is no rebound and no guarding. She does have a palpable bulge just above and to the right of her umbilicus which is likely a recurrent umbilical hernia Musculoskeletal: Normal range of motion. She exhibits no edema and no tenderness.  Neurological: She is alert and oriented to person, place, and time.  Skin: Skin is warm and dry. No rash noted. She is not diaphoretic. No erythema. No pallor.  Psychiatric: She has a normal mood and affect. Her behavior is normal. Judgment and thought content normal.    Data Reviewed UGI normal   Assessment    Morbid obesity with a BMI of 45.84 and obesity related comorbidities of diabetes mellitus, hypertension, depression, asthma, and hypothyroidism She is interested in weight loss surgery and seems to have failed medical attempts. I think that she would be a fine candidate for any of the options which she chooses. We did discuss with her the options for medical and surgical weight loss including the options were that band, sleeve gastrectomy, and Roux-en-Y gastric bypass. She remains interested in the sleeve gastrectomy and we discussed the procedure and its risks and benefits.  The risks of infection, bleeding, pain, scarring, weight regain, too little or too much weight loss, vitamin deficiencies and need for lifelong vitamin supplementation, hair loss, need for protein supplementation, leaks, stricture, reflux, food intolerance, need  for reoperation and conversion to roux Y gastric bypass, need for open surgery, injury to spleen or surrounding structures, DVT's, PE, and death again discussed with the patient and the patient expressed understanding and desires to proceed with laparoscopic vertical sleeve gastrectomy, possible open, intraoperative endoscopy. She also has a umbilical hernia which is recurrent and we discussed the possibility of simultaneous repair but I think that the best option for her would be to perform the weight loss surgery and subsequent hernia repair after she has lost weight in order to minimize recurrence.     Plan     Lap sleeve gastrectomy Friday, Dec 12th.        Bryton Romagnoli B 05/06/2013, 5:17 PM    

## 2013-05-14 NOTE — Transfer of Care (Signed)
Immediate Anesthesia Transfer of Care Note  Patient: Dietitian  Procedure(s) Performed: Procedure(s): LAPAROSCOPIC GASTRIC SLEEVE RESECTION (N/A) UPPER GI ENDOSCOPY HERNIA REPAIR UMBILICAL ADULT  Patient Location: PACU  Anesthesia Type:General  Level of Consciousness: awake, alert , oriented and patient cooperative  Airway & Oxygen Therapy: Patient Spontanous Breathing and Patient connected to face mask oxygen  Post-op Assessment: Report given to PACU RN, Post -op Vital signs reviewed and stable and Patient moving all extremities X 4  Post vital signs: Reviewed and stable  Complications: No apparent anesthesia complications

## 2013-05-14 NOTE — Anesthesia Postprocedure Evaluation (Signed)
  Anesthesia Post-op Note  Patient: Dietitian  Procedure(s) Performed: Procedure(s) (LRB): LAPAROSCOPIC GASTRIC SLEEVE RESECTION (N/A) UPPER GI ENDOSCOPY HERNIA REPAIR UMBILICAL ADULT  Patient Location: PACU  Anesthesia Type: General  Level of Consciousness: awake and alert   Airway and Oxygen Therapy: Patient Spontanous Breathing  Post-op Pain: mild  Post-op Assessment: Post-op Vital signs reviewed, Patient's Cardiovascular Status Stable, Respiratory Function Stable, Patent Airway and No signs of Nausea or vomiting  Last Vitals:  Filed Vitals:   05/14/13 1230  BP: 122/73  Pulse: 92  Temp: 36.7 C  Resp: 17    Post-op Vital Signs: stable   Complications: No apparent anesthesia complications

## 2013-05-14 NOTE — Interval H&P Note (Signed)
History and Physical Interval Note:  05/14/2013 7:22 AM  Amanda Davidson  has presented today for surgery, with the diagnosis of morbid obesity   The various methods of treatment have been discussed with the patient and family. After consideration of risks, benefits and other options for treatment, the patient has consented to  Procedure(s): LAPAROSCOPIC GASTRIC SLEEVE RESECTION (N/A) as a surgical intervention .  The patient's history has been reviewed, patient examined, no change in status, stable for surgery.  I have reviewed the patient's chart and labs.  Questions were answered to the patient's satisfaction.     Bartt Gonzaga B

## 2013-05-14 NOTE — Brief Op Note (Signed)
05/14/2013  10:18 AM  PATIENT:  Graciella Belton  42 y.o. female  PRE-OPERATIVE DIAGNOSIS:  morbid obesity   POST-OPERATIVE DIAGNOSIS:  morbid obesity   PROCEDURE:  Procedure(s): LAPAROSCOPIC GASTRIC SLEEVE RESECTION (N/A) UPPER GI ENDOSCOPY HERNIA REPAIR UMBILICAL ADULT  SURGEON:  Surgeon(s) and Role:    * Valarie Merino, MD - Primary    * Mariella Saa, MD - Assisting  PHYSICIAN ASSISTANT:   ASSISTANTS: Jaclynn Guarneri, MD, FACS   ANESTHESIA:   general  EBL:  Total I/O In: 2450 [I.V.:2400; IV Piggyback:50] Out: 150 [Urine:150]  BLOOD ADMINISTERED:none  DRAINS: none and Gastrostomy Tube   LOCAL MEDICATIONS USED:  MARCAINE     SPECIMEN:  Source of Specimen:  stomach  DISPOSITION OF SPECIMEN:  PATHOLOGY  COUNTS:  YES  TOURNIQUET:  * No tourniquets in log *  DICTATION: .Other Dictation: Dictation Number [id number]  PLAN OF CARE: Admit to inpatient   PATIENT DISPOSITION:  PACU - hemodynamically stable.   Delay start of Pharmacological VTE agent (>24hrs) due to surgical blood loss or risk of bleeding: no

## 2013-05-15 ENCOUNTER — Inpatient Hospital Stay (HOSPITAL_COMMUNITY): Payer: 59

## 2013-05-15 LAB — GLUCOSE, CAPILLARY
Glucose-Capillary: 102 mg/dL — ABNORMAL HIGH (ref 70–99)
Glucose-Capillary: 93 mg/dL (ref 70–99)

## 2013-05-15 LAB — CBC WITH DIFFERENTIAL/PLATELET
Basophils Absolute: 0 10*3/uL (ref 0.0–0.1)
Basophils Relative: 0 % (ref 0–1)
Eosinophils Absolute: 0 10*3/uL (ref 0.0–0.7)
Eosinophils Relative: 0 % (ref 0–5)
HCT: 36.2 % (ref 36.0–46.0)
Lymphs Abs: 2.2 10*3/uL (ref 0.7–4.0)
MCHC: 32.6 g/dL (ref 30.0–36.0)
MCV: 89.8 fL (ref 78.0–100.0)
Monocytes Absolute: 0.9 10*3/uL (ref 0.1–1.0)
Platelets: 287 10*3/uL (ref 150–400)
RBC: 4.03 MIL/uL (ref 3.87–5.11)
RDW: 14.2 % (ref 11.5–15.5)
WBC: 12.2 10*3/uL — ABNORMAL HIGH (ref 4.0–10.5)

## 2013-05-15 MED ORDER — IOHEXOL 300 MG/ML  SOLN
50.0000 mL | Freq: Once | INTRAMUSCULAR | Status: AC | PRN
  Administered 2013-05-15: 50 mL via ORAL

## 2013-05-15 NOTE — Progress Notes (Signed)
1 Day Post-Op Lap Sleeve Gastrectomy Subjective: Feels fine  Objective: Vital signs in last 24 hours: Temp:  [97.6 F (36.4 C)-98.5 F (36.9 C)] 97.6 F (36.4 C) (12/13 0614) Pulse Rate:  [66-98] 66 (12/13 0614) Resp:  [13-20] 20 (12/13 0614) BP: (102-128)/(59-84) 114/83 mmHg (12/13 0614) SpO2:  [93 %-100 %] 93 % (12/13 0927) Weight:  [284 lb 6.4 oz (129.003 kg)] 284 lb 6.4 oz (129.003 kg) (12/13 0614)   Intake/Output from previous day: 12/12 0701 - 12/13 0700 In: 4418.3 [I.V.:4368.3; IV Piggyback:50] Out: 1330 [Urine:1330] Intake/Output this shift:     General appearance: alert and cooperative GI: soft, appropriately tender  Incision: no significant drainage, no significant erythema  Lab Results:   Recent Labs  05/14/13 1220 05/15/13 0407  WBC 12.8* 12.2*  HGB 12.3 11.8*  HCT 37.2 36.2  PLT 345 287   BMET  Recent Labs  05/14/13 1220  CREATININE 0.81   PT/INR No results found for this basename: LABPROT, INR,  in the last 72 hours ABG No results found for this basename: PHART, PCO2, PO2, HCO3,  in the last 72 hours  MEDS, Scheduled . budesonide-formoterol  2 puff Inhalation BID  . heparin subcutaneous  5,000 Units Subcutaneous Q8H  . insulin aspart  0-20 Units Subcutaneous Q4H  . insulin glargine  10 Units Subcutaneous QHS  . [START ON 05/16/2013] protein supplement  2 oz Oral QID   Or  . [START ON 05/16/2013] protein supplement  2 oz Oral QID   Or  . [START ON 05/16/2013] protein supplement  2 oz Oral QID    Studies/Results: No results found.  Assessment: s/p Procedure(s): LAPAROSCOPIC GASTRIC SLEEVE RESECTION UPPER GI ENDOSCOPY HERNIA REPAIR UMBILICAL ADULT Patient Active Problem List   Diagnosis Date Noted  . Laparoscopic sleeve gastrectomy Dec 2014 05/14/2013  . Asthma, moderate persistent 09/18/2012  . Diabetes mellitus, type 2 09/18/2012  . Severe obesity (BMI >= 40) 09/18/2012  . Hypertension, essential, benign 06/30/2012  . Sleep  apnea 05/24/2012  . Hypothyroidism 05/24/2012  . Migraine 05/24/2012  . Vitamin D deficiency 05/24/2012  . Asthma 05/24/2012  . Depression with anxiety 05/24/2012    S/p Sleeve Gastrectomy   Plan: UGI swallow eval today If no leak or obstruction, will advance to POD 1 diet   LOS: 1 day     .Vanita Panda, MD [facility], [place] [phone]   05/15/2013 10:35 AM

## 2013-05-15 NOTE — Op Note (Signed)
NAMETAREN, DYMEK             ACCOUNT NO.:  [id number]  MEDICAL RECORD NO.:  [id number]  LOCATION:  1523                         FACILITY:  [facility]  PHYSICIAN:  Thornton Park. Daphine Deutscher, MD  DATE OF BIRTH:  09/18/1970  DATE OF PROCEDURE:  05/14/2013 DATE OF DISCHARGE:                              OPERATIVE REPORT   PREOPERATIVE DIAGNOSES:  Severe obesity, BMI greater than 40, diabetes mellitus type 2, moderate asthma, hypertension, and anxiety with depression.  PROCEDURE:  Laparoscopic sleeve gastrectomy, closure of umbilical hernia.  SURGEON:  Thornton Park. Daphine Deutscher, MD  ASSISTANT:  Lorne Skeens. Hoxworth, MD  ANESTHESIA:  General endotracheal.  DESCRIPTION OF PROCEDURE:  The patient was taken to room 1 on Friday, May 14, 2013 and given general anesthesia.  The abdomen was prepped with PCO max and draped sterilely.  A time-out was performed.  Access to the abdomen was achieved through the left upper quadrant with a 5 mm 0 degree Optiview without difficulty.  The abdomen is insufflated. Following insufflation, there was a ventral hernia containing omentum and I went ahead and removed the fat that was incarcerated up in that revealing a small defect and with piece of visible mesh that had pulled away.  I went ahead and proceeded with the operation placing a 12 to the right of the midline and 5 laterally.  Also initial entry 5 mm was eventually upgraded to a 15 but it was placed very obliquely into the upper abdomen.  The operation began with measuring about 5 cm from the pylorus and then going and taking down the greater curvature.  I carried this up along the greater curvature hugging the stomach up to taking it down from the spleen.  It was very stuck next to the spleen and carried this up with a Harmonic Scalpel to the left crus.  The stomach was completely mobilized at that point and was free posteriorly.  We then began the sleeve down at that 5 cm mark, but doing about a half  staple with a green load echelon stapler and then passing a 36 bougie into the antrum.  With that in place, I then fashioned the sleeve, firing 1 green followed by blue loads all the way up to stay in line and create a nice tubular sleeve structure.  When completely detached, Dr. Johna Sheriff did endoscopy insufflating and we looked for evidence of leak and saw none. Nice tubular pouch was visible.  The resected stomach was then brought out through the 15 port without difficulty.  The port was placed back in.  I then freed up this piece of mesh that had pulled away and with a horizontal mattress suture of 0 Novafil, I brought that up to kind of cover over this hernia defect which was quite small.  I think it will likely need to be repaired definitively once the patient loses weight. Once that was tied down, I injected all the port sites with Exparel, deflated the abdomen.  I did put some Tisseel along the staple line. The staple line was not bleeding and looked to be intact.  We then closed the wounds with 4-0 Vicryl and with Dermabond.  The 15 port was, like I  said, placed very obliquely, I put my finger in it and I was unable to get past the anterior fascial opening.  The patient tolerated the procedure well and was taken to the recovery room in satisfactory condition.     Thornton Park Daphine Deutscher, MD     MBM/MEDQ  D:  05/14/2013  T:  05/15/2013  Job:  [id number]

## 2013-05-16 LAB — CBC WITH DIFFERENTIAL/PLATELET
Basophils Relative: 0 % (ref 0–1)
Eosinophils Absolute: 0.1 10*3/uL (ref 0.0–0.7)
HCT: 35.3 % — ABNORMAL LOW (ref 36.0–46.0)
Hemoglobin: 11.4 g/dL — ABNORMAL LOW (ref 12.0–15.0)
MCH: 29.2 pg (ref 26.0–34.0)
MCHC: 32.3 g/dL (ref 30.0–36.0)
Monocytes Absolute: 1.1 10*3/uL — ABNORMAL HIGH (ref 0.1–1.0)
Monocytes Relative: 12 % (ref 3–12)
Neutrophils Relative %: 66 % (ref 43–77)
Platelets: 242 10*3/uL (ref 150–400)
RDW: 14.3 % (ref 11.5–15.5)

## 2013-05-16 LAB — GLUCOSE, CAPILLARY: Glucose-Capillary: 112 mg/dL — ABNORMAL HIGH (ref 70–99)

## 2013-05-16 MED ORDER — OXYCODONE-ACETAMINOPHEN 5-325 MG/5ML PO SOLN: 5.0000 mL | ORAL | Status: DC | PRN

## 2013-05-16 NOTE — Progress Notes (Signed)
Discharge instructions discussed with pt . Pt able to answer question about diet, activity and when to call MD. Iv removed. Am assessment unchanged. Tolerated protein shake without nausea.

## 2013-05-16 NOTE — Discharge Summary (Signed)
Physician Discharge Summary  Patient ID: Amanda Davidson MRN: [id number] DOB/AGE: 1970-11-29 42 y.o.  Admit date: 05/14/2013 Discharge date: 05/16/2013  Admission Diagnoses: Sleep apnea   Hypothyroidism   Vitamin D deficiency   Asthma   Depression with anxiety   Diabetes mellitus, type 2   Severe obesity (BMI >= 40)  Discharge Diagnoses:  Principal Problem:   Laparoscopic sleeve gastrectomy Dec 2014 Active Problems:   Sleep apnea   Hypothyroidism   Vitamin D deficiency   Asthma   Depression with anxiety   Diabetes mellitus, type 2   Severe obesity (BMI >= 40)   Discharged Condition: good  Hospital Course: Pt underwent sleeve gastrectomy.  She had an uneventful recovery  Consults: None  Significant Diagnostic Studies: labs: cbc, chemistry  Treatments: IV hydration and analgesia: acetaminophen w/ codeine  Discharge Exam: Blood pressure 121/85, pulse 87, temperature 97.9 F (36.6 C), temperature source Oral, resp. rate 18, height 5\' 6"  (1.676 m), weight 284 lb 1.6 oz (128.867 kg), last menstrual period 05/12/2013, SpO2 95.00%. General appearance: alert and cooperative GI: normal findings: soft, non-tender Incision/Wound: clean, dry, intact  Disposition: Home   Future Appointments Provider Department Dept Phone   05/31/2013 3:30 PM [facility] Post-Op Class [facility] [phone]   06/02/2013 9:20 AM Valarie Merino, MD [facility], [place] [phone]       Medication List         albuterol 108 (90 BASE) MCG/ACT inhaler  Commonly known as:  PROVENTIL HFA;VENTOLIN HFA  Inhale 2 puffs into the lungs every 6 (six) hours as needed for wheezing.     budesonide-formoterol 80-4.5 MCG/ACT inhaler  Commonly known as:  SYMBICORT  Inhale 2 puffs into the lungs 2 (two) times daily.     buPROPion 300 MG 24 hr tablet  Commonly known as:  WELLBUTRIN XL  Take 300 mg by mouth every evening.     levothyroxine 137 MCG  tablet  Commonly known as:  SYNTHROID, LEVOTHROID  Take 137 mcg by mouth daily before breakfast.     losartan-hydrochlorothiazide 50-12.5 MG per tablet  Commonly known as:  HYZAAR  Take 1 tablet by mouth every evening.     metFORMIN 1000 MG tablet  Commonly known as:  GLUCOPHAGE  Take 1,000 mg by mouth daily with lunch.     montelukast 10 MG tablet  Commonly known as:  SINGULAIR  Take 1 tablet (10 mg total) by mouth at bedtime.     norethindrone-ethinyl estradiol 1-20 MG-MCG tablet  Commonly known as:  JUNEL FE,GILDESS FE,LOESTRIN FE  Take 1 tablet by mouth daily.     oxyCODONE-acetaminophen 5-325 MG/5ML solution  Commonly known as:  ROXICET  Take 5-10 mLs by mouth every 4 (four) hours as needed for moderate pain or severe pain.     SUMAtriptan 100 MG tablet  Commonly known as:  IMITREX  Take 100 mg by mouth every 2 (two) hours as needed for migraine or headache. May repeat in 2 hours if headache persists or recurs.           Follow-up Information   Follow up with Valarie Merino, MD. Schedule an appointment as soon as possible for a visit on 06/02/2013.   Specialty:  General Surgery   Contact information:   [address] [place] [address] [phone]       Signed: Vanita Panda 05/16/2013, 8:50 AM

## 2013-05-17 ENCOUNTER — Encounter (HOSPITAL_COMMUNITY): Payer: Self-pay | Admitting: Surgery

## 2013-05-17 LAB — GLUCOSE, CAPILLARY: Glucose-Capillary: 95 mg/dL (ref 70–99)

## 2013-05-24 ENCOUNTER — Ambulatory Visit (INDEPENDENT_AMBULATORY_CARE_PROVIDER_SITE_OTHER): Payer: 59 | Admitting: Physician Assistant

## 2013-05-24 ENCOUNTER — Encounter: Payer: Self-pay | Admitting: Physician Assistant

## 2013-05-24 VITALS — BP 130/79 | HR 75 | Wt 266.0 lb

## 2013-05-24 DIAGNOSIS — E039 Hypothyroidism, unspecified: Secondary | ICD-10-CM

## 2013-05-24 DIAGNOSIS — F341 Dysthymic disorder: Secondary | ICD-10-CM

## 2013-05-24 DIAGNOSIS — I10 Essential (primary) hypertension: Secondary | ICD-10-CM

## 2013-05-24 DIAGNOSIS — E8881 Metabolic syndrome: Secondary | ICD-10-CM

## 2013-05-24 DIAGNOSIS — F418 Other specified anxiety disorders: Secondary | ICD-10-CM

## 2013-05-24 LAB — COMPLETE METABOLIC PANEL WITH GFR
Alkaline Phosphatase: 91 U/L (ref 39–117)
CO2: 25 mEq/L (ref 19–32)
Calcium: 9.6 mg/dL (ref 8.4–10.5)
Chloride: 101 mEq/L (ref 96–112)
Creat: 0.82 mg/dL (ref 0.50–1.10)
GFR, Est African American: >89 mL/min
GFR, Est Non African American: 87 mL/min
Glucose, Bld: 73 mg/dL (ref 70–99)
Potassium: 4.5 mEq/L (ref 3.5–5.3)
Sodium: 138 mEq/L (ref 135–145)
Total Bilirubin: 0.5 mg/dL (ref 0.3–1.2)
Total Protein: 6.5 g/dL (ref 6.0–8.3)

## 2013-05-24 LAB — TSH: TSH: 0.481 u[IU]/mL (ref 0.350–4.500)

## 2013-05-24 MED ORDER — AMBULATORY NON FORMULARY MEDICATION: Status: DC

## 2013-05-24 NOTE — Progress Notes (Signed)
   Subjective:    Patient ID: Amanda Davidson, female    DOB: 10-31-1970, 42 y.o.   MRN: [id number]  HPI Pt presents to the clinic to follow up after gastric sleeve surgery on 05/14/2013. Pt feels great and has no compliants. She has stopped metformin for diabetes. She has not been checking glucose as all. Last A1C was 5.5 early in December. She has stopped BP medication for HTN. She denies any CP, palpitations, vision changes, HA's. She has hypothyroidisim but not been checked recently. Continues to see nutritionist and will start working out in the next couple of weeks.    Review of Systems     Objective:   Physical Exam  Constitutional: She is oriented to person, place, and time. She appears well-developed and well-nourished.  HENT:  Head: Normocephalic and atraumatic.  Cardiovascular: Normal rate, regular rhythm and normal heart sounds.   Pulmonary/Chest: Effort normal and breath sounds normal.  Neurological: She is alert and oriented to person, place, and time.  Skin: Skin is warm and dry.  Psychiatric: She has a normal mood and affect. Her behavior is normal.          Assessment & Plan:  Hypothyroidism- we'll recheck thyroid level. On make sure thyroid levels not changing with recent surgery and continual weight loss.  Insulin resistance- patient as of now is downgraded to insulin resistance from diabetes. Her last A1c was normal. She has not been taking her metformin. Check random glucose today and was 1 and 1. She has had a half a cup of juice this morning. I did give her prescription for glucometer. I would like for patient to check occasionally fasting glucose in the morning as well as if she starts to feel dizzy or fatigue. I worry is that with the decrease in calories that she could become hypoglycemic. We'll check A1c in 2 months.  Hypertension-blood pressure looks good today without medication. We'll continue to monitor. As patient loses weight suspect BP will continue  to stay control.  6

## 2013-05-29 ENCOUNTER — Emergency Department (INDEPENDENT_AMBULATORY_CARE_PROVIDER_SITE_OTHER)
Admission: EM | Admit: 2013-05-29 | Discharge: 2013-05-29 | Disposition: A | Discharge status: Home / Self Care | Payer: 59 | Source: Home / Self Care | Attending: Emergency Medicine | Admitting: Emergency Medicine

## 2013-05-29 ENCOUNTER — Encounter: Payer: Self-pay | Admitting: Emergency Medicine

## 2013-05-29 DIAGNOSIS — J45909 Unspecified asthma, uncomplicated: Secondary | ICD-10-CM

## 2013-05-29 DIAGNOSIS — J09X2 Influenza due to identified novel influenza A virus with other respiratory manifestations: Secondary | ICD-10-CM

## 2013-05-29 LAB — POCT INFLUENZA A/B
Influenza A, POC: POSITIVE
Influenza B, POC: NEGATIVE

## 2013-05-29 MED ORDER — OSELTAMIVIR PHOSPHATE 12 MG/ML PO SUSR
75.0000 mg | Freq: Two times a day (BID) | ORAL | Status: DC
Start: 2013-05-29 — End: 2013-07-21

## 2013-05-29 MED ORDER — PROMETHAZINE-CODEINE 6.25-10 MG/5ML PO SYRP: ORAL_SOLUTION | ORAL | Status: DC

## 2013-05-29 MED ORDER — PREDNISONE (PAK) 10 MG PO TABS: ORAL_TABLET | ORAL | Status: DC

## 2013-05-29 NOTE — ED Notes (Signed)
Pt had sudden onset of fever cough body aches with wheezing cough starting yesterday.  Pt also had gastric sleeve surgery 2 wks ago.

## 2013-05-30 NOTE — ED Provider Notes (Signed)
CSN: [id number]     Arrival date & time 05/29/13  1553 History   First MD Initiated Contact with Patient 05/29/13 1638     Chief Complaint  Patient presents with  . Fever  . Wheezing   (Consider location/radiation/quality/duration/timing/severity/associated sxs/prior Treatment) HPI FLU  HPI : Flu symptoms for about 1 day. Fever to 102 with chills, sweats, myalgias, fatigue, headache. Symptoms are progressively worsening, despite trying OTC fever reducing medicine and rest and fluids. Has decreased appetite, but tolerating some liquids by mouth. No history of recent tick bite. She has past medical history of chronic asthma, and she feels that's flared up with this illness, cough occasionally productive of colored sputum, with increased wheezing. No chest pain.  Review of Systems: Positive for fatigue, mild nasal congestion, mild sore throat, mild swollen anterior neck glands, + cough and wheeze. Negative for acute vision changes, stiff neck, focal weakness, syncope, seizures, respiratory distress, vomiting, diarrhea, GU symptoms, new Rash. Past Medical History  Diagnosis Date  . Hypertension   . Obesity   . Exercise-induced asthma   . Diabetes mellitus without complication   . Complication of anesthesia     NAUSEA  . Post-nasal drip   . Headache(784.0)     MIGRAINES  . Hypothyroidism   . Sleep apnea     STOPPPED USING C-PAP 6 MO AGO   Past Surgical History  Procedure Laterality Date  . Hernia repair    . Laparoscopic gastric sleeve resection N/A 05/14/2013    Procedure: LAPAROSCOPIC GASTRIC SLEEVE RESECTION;  Surgeon: Valarie Merino, MD;  Location: [facility];  Service: General;  Laterality: N/A;  . Upper gi endoscopy  05/14/2013    Procedure: UPPER GI ENDOSCOPY;  Surgeon: Valarie Merino, MD;  Location: [facility];  Service: General;;  . Umbilical hernia repair  05/14/2013    Procedure: HERNIA REPAIR UMBILICAL ADULT;  Surgeon: Valarie Merino, MD;  Location: [facility];  Service:  General;;   Family History  Problem Relation Age of Onset  . Hypertension Mother   . Hypertension Father   . Diabetes Father   . Heart disease Maternal Grandfather   . Heart attack Maternal Grandfather   . Cancer Maternal Grandfather     bladder  . Hyperlipidemia Other   . Sleep apnea Other    History  Substance Use Topics  . Smoking status: Never Smoker   . Smokeless tobacco: Never Used  . Alcohol Use: 0.5 oz/week    1 drink(s) per week     Comment: 2 glasses wine @ 2x/month   OB History   Grav Para Term Preterm Abortions TAB SAB Ect Mult Living                 Review of Systems  Allergies  Review of patient's allergies indicates no known allergies.  Home Medications   Current Outpatient Rx  Name  Route  Sig  Dispense  Refill  . albuterol (PROVENTIL HFA;VENTOLIN HFA) 108 (90 BASE) MCG/ACT inhaler   Inhalation   Inhale 2 puffs into the lungs every 6 (six) hours as needed for wheezing.   1 Inhaler   3   . AMBULATORY NON FORMULARY MEDICATION      Glucometer strips and lancets.   1 Device   0   . budesonide-formoterol (SYMBICORT) 80-4.5 MCG/ACT inhaler   Inhalation   Inhale 2 puffs into the lungs 2 (two) times daily.   1 Inhaler   6   . levothyroxine (SYNTHROID, LEVOTHROID) 137 MCG  tablet   Oral   Take 137 mcg by mouth daily before breakfast.         . montelukast (SINGULAIR) 10 MG tablet   Oral   Take 1 tablet (10 mg total) by mouth at bedtime.   30 tablet   6   . norethindrone-ethinyl estradiol (JUNEL FE,GILDESS FE,LOESTRIN FE) 1-20 MG-MCG tablet   Oral   Take 1 tablet by mouth daily.         Marland Kitchen oseltamivir (TAMIFLU) 12 MG/ML suspension   Oral   Take 75 mg by mouth 2 (two) times daily.   60 mL   0   . oxyCODONE-acetaminophen (ROXICET) 5-325 MG/5ML solution   Oral   Take 5-10 mLs by mouth every 4 (four) hours as needed for moderate pain or severe pain.   250 mL   0   . predniSONE (STERAPRED UNI-PAK) 10 MG tablet      Take as directed  for 6 days.--Take 6 on day one, 5 on day 2, 4 on day 3, then 3 tablets on day 4, then 2 tablets on day 5, then 1 tablet on day 6.   21 tablet   0   . promethazine-codeine (PHENERGAN WITH CODEINE) 6.25-10 MG/5ML syrup      Take 1-2 teaspoons every 4-6 hours as needed for cough. May cause drowsiness.   120 mL   0   . SUMAtriptan (IMITREX) 100 MG tablet   Oral   Take 100 mg by mouth every 2 (two) hours as needed for migraine or headache. May repeat in 2 hours if headache persists or recurs.          BP 122/82  Pulse 91  Temp(Src) 98.1 F (36.7 C) (Oral)  Ht 5\' 6"  (1.676 m)  Wt 263 lb (119.296 kg)  BMI 42.47 kg/m2  SpO2 100%  LMP 05/12/2013 Physical Exam  Nursing note and vitals reviewed. Constitutional: She appears well-developed and well-nourished.  Non-toxic appearance. She appears ill (very fatigued, but no cardiorespiratory distress). No distress.  HENT:  Head: Normocephalic and atraumatic.  Right Ear: Tympanic membrane and external ear normal.  Left Ear: Tympanic membrane and external ear normal.  Nose: Rhinorrhea present.  Mouth/Throat: Mucous membranes are normal. Posterior oropharyngeal erythema (mild redness ) present. No oropharyngeal exudate.  Eyes: Conjunctivae are normal. Right eye exhibits no discharge. Left eye exhibits no discharge. No scleral icterus.  Neck: Neck supple.  Cardiovascular: Normal rate, regular rhythm and normal heart sounds.   Pulmonary/Chest: No stridor. No respiratory distress. She has wheezes (Mild late expiratory). She has no rales.  A few anterior rhonchi  Abdominal: Soft. There is no tenderness.  Musculoskeletal: She exhibits no edema.  Lymphadenopathy:    She has cervical adenopathy (mild shoddy anterior cervical nodes).  Neurological: She is alert.  Skin: Skin is warm and intact. No rash noted. She is diaphoretic.  Psychiatric: She has a normal mood and affect.    ED Course  Procedures (including critical care time) Labs  Review Labs Reviewed  POCT INFLUENZA A/B   Imaging Review No results found.  EKG Interpretation    Date/Time:    Ventricular Rate:    PR Interval:    QRS Duration:   QT Interval:    QTC Calculation:   R Axis:     Text Interpretation:              MDM   1. Influenza due to identified novel influenza A virus with other respiratory manifestations  2. Asthmatic bronchitis    Rapid tests positive for influenza A., negative for influenza B. Tamiflu 75 twice a day x5 days She has albuterol rescue inhaler to use when necessary. Other symptomatic care discussed Continue other chronic asthma meds Route small prescription for Phenergan with codeine, 1 or 2 teaspoons at bedtime when necessary cough. Precautions discussed  I gave her handwritten prescription for prednisone 10 mg-6 day Dosepak, to fill if wheezing persisted, and she declined to get that filled today. Precautions discussed. Red flags discussed. Questions invited and answered. Patient voiced understanding and agreement.    Lajean Manes, MD 05/30/13 [phone]

## 2013-05-31 ENCOUNTER — Encounter: Payer: 59 | Attending: General Surgery

## 2013-05-31 DIAGNOSIS — Z01818 Encounter for other preprocedural examination: Secondary | ICD-10-CM | POA: Insufficient documentation

## 2013-05-31 DIAGNOSIS — E669 Obesity, unspecified: Secondary | ICD-10-CM | POA: Insufficient documentation

## 2013-05-31 NOTE — Patient Instructions (Signed)
Patient to follow Phase 3A-Soft, High Protein Diet and follow-up at [facility] in 6 weeks for 2 months post-op nutrition visit for diet advancement. 

## 2013-05-31 NOTE — Progress Notes (Signed)
Bariatric Class:  Appt start time: 1530 end time:  1630.  2 Week Post-Operative Nutrition Class  Patient was seen on 05/31/2013 for Post-[facility]ative Nutrition [facility].   Surgery date: 05/31/13 Surgery type: Sleeve Starting weight at [facility]: 284 lbs  Weight today: 261.5 lbs Weight change: 22.5 lbs Total weight lost: 22.5 lbs  TANITA  BODY COMP RESULTS  05/31/13   BMI (kg/m^2) 42.2   Fat Mass (lbs) 133.5   Fat Free Mass (lbs) 128.0   Total Body Water (lbs) 93.5   The following the learning objectives were met by the patient during this course:  Identifies Phase 3A (Soft, High Proteins) Dietary Goals and will begin from 2 weeks post-operatively to 2 months post-operatively  Identifies appropriate sources of fluids and proteins   States protein recommendations and appropriate sources post-operatively  Identifies the need for appropriate texture modifications, mastication, and bite sizes when consuming solids  Identifies appropriate multivitamin and calcium sources post-operatively  Describes the need for physical activity post-operatively and will follow MD recommendations  States when to call healthcare provider regarding medication questions or post-operative complications  Handouts given during class include:  Phase 3A: Soft, High Protein Diet Handout  Band Fill Guidelines Handout  Follow-Up Plan: Patient will follow-up at [facility] in 6 weeks for 8 week post-op nutrition visit for diet advancement per MD.

## 2013-06-02 ENCOUNTER — Ambulatory Visit (INDEPENDENT_AMBULATORY_CARE_PROVIDER_SITE_OTHER): Payer: 59 | Admitting: Surgery

## 2013-06-02 ENCOUNTER — Encounter (INDEPENDENT_AMBULATORY_CARE_PROVIDER_SITE_OTHER): Payer: Self-pay | Admitting: Surgery

## 2013-06-02 VITALS — BP 128/80 | HR 68 | Temp 98.5°F | Resp 16 | Ht 66.0 in | Wt 261.0 lb

## 2013-06-02 DIAGNOSIS — Z9884 Bariatric surgery status: Secondary | ICD-10-CM

## 2013-06-02 NOTE — Patient Instructions (Signed)
Thanks for your patience.  If you need further assistance after leaving the office, please call our office and speak with a [facility] nurse.  [phone].  If you want to leave a message for Dr. Kiowa Hollar, please call his office phone at [phone]. 

## 2013-06-02 NOTE — Progress Notes (Signed)
Amanda Davidson 42 y.o.  Body mass index is 42.15 kg/(m^2).  Patient Active Problem List   Diagnosis Date Noted  . Insulin resistance 05/24/2013  . Laparoscopic sleeve gastrectomy Dec 2014 05/14/2013  . Asthma, moderate persistent 09/18/2012  . Diabetes mellitus, type 2 09/18/2012  . Severe obesity (BMI >= 40) 09/18/2012  . Hypertension, essential, benign 06/30/2012  . Sleep apnea 05/24/2012  . Hypothyroidism 05/24/2012  . Migraine 05/24/2012  . Vitamin D deficiency 05/24/2012  . Asthma 05/24/2012  . Depression with anxiety 05/24/2012    No Known Allergies  Past Surgical History  Procedure Laterality Date  . Hernia repair    . Laparoscopic gastric sleeve resection N/A 05/14/2013    Procedure: LAPAROSCOPIC GASTRIC SLEEVE RESECTION;  Surgeon: Valarie Merino, MD;  Location: [facility];  Service: General;  Laterality: N/A;  . Upper gi endoscopy  05/14/2013    Procedure: UPPER GI ENDOSCOPY;  Surgeon: Valarie Merino, MD;  Location: [facility];  Service: General;;  . Umbilical hernia repair  05/14/2013    Procedure: HERNIA REPAIR UMBILICAL ADULT;  Surgeon: Valarie Merino, MD;  Location: [facility];  Service: General;;   Caleen Essex, JADE, PA-C No diagnosis found.  Doing well.  23 lbs weight loss since sleeve.  Questions answered.  Encouraged to begin workouts and can go back to work on Jun 04, 2013 Matt B. Daphine Deutscher, MD, [facility] [phone] beeper [phone]  06/02/2013 10:15 AM

## 2013-06-05 ENCOUNTER — Other Ambulatory Visit: Payer: Self-pay | Admitting: Physician Assistant

## 2013-07-14 ENCOUNTER — Encounter: Payer: 59 | Attending: General Surgery | Admitting: Dietician

## 2013-07-14 ENCOUNTER — Other Ambulatory Visit: Payer: Self-pay | Admitting: Physician Assistant

## 2013-07-14 DIAGNOSIS — E669 Obesity, unspecified: Secondary | ICD-10-CM | POA: Insufficient documentation

## 2013-07-14 DIAGNOSIS — Z01818 Encounter for other preprocedural examination: Secondary | ICD-10-CM | POA: Insufficient documentation

## 2013-07-14 MED ORDER — FLUTICASONE-SALMETEROL 250-50 MCG/DOSE IN AEPB: 1.0000 | INHALATION_SPRAY | Freq: Two times a day (BID) | RESPIRATORY_TRACT | Status: DC

## 2013-07-14 NOTE — Progress Notes (Signed)
Call pt: insurance company sent letter need to switch to Advair and stop symbicort. Sent to pharmacy.

## 2013-07-14 NOTE — Progress Notes (Signed)
Pt.notified

## 2013-07-14 NOTE — Progress Notes (Signed)
  Follow-up visit:  8 Weeks Post-Operative Sleeve Surgery  Medical Nutrition Therapy:  Appt start time: 0900 end time:  0930.  Primary concerns today: Post-operative Bariatric Surgery Nutrition Management. Returns today with a 12 lbs weight loss. Had a total of 6 glasses of wine last weekend and had a fish taco last night. Advised Amanda Davidson to stick to protein and non starchy vegetables even if she can tolerate other foods.   Surgery date: 05/31/13 Surgery type: Sleeve [facility]  Weight today: 249.5 lbs  Weight change: 12 lbs Total weight lost: 34.5 lbs  TANITA  BODY COMP RESULTS  05/31/13 07/14/13   BMI (kg/m^2) 42.2 40.3   Fat Mass (lbs) 133.5 120.0   Fat Free Mass (lbs) 128.0 129.5   Total Body Water (lbs) 93.5 95.0    Preferred Learning Style:   No preference indicated   Learning Readiness:   Ready  24-hr recall: B (AM): EAS protein shake 17 g Snk (AM): Yogurt (12 g)  L (PM): 3 oz chicken (21 g) Snk (PM): none D (PM): 4 oz of protein + vegetable (28 g) Snk (PM): none  Fluid intake: 30 oz water + protein shake (40-50 oz) Estimated total protein intake: ~70 g  Medications: no longer taking blood pressure, diabetes, or depression meds Supplementation: taking though forgets to take second dose of calcium sometimes  Using straws: No Drinking while eating: sips Hair loss: No Carbonated beverages: No N/V/D/C: No Dumping syndrome: No   Recent physical activity:  30 minutes everyday either walking or working out with trainer  Progress Towards Goal(s):  In progress.  Handouts given during visit include:  Phase 3B High Protein + Non Starchy Vegetables   Nutritional Diagnosis:  [place]-3.3 Overweight/obesity related to past poor dietary habits and physical inactivity as evidenced by patient w/ recent Sleeve surgery following dietary guidelines for continued weight loss.    Intervention:  Nutrition education/diet advancement.  Teaching Method  Utilized:  Visual Auditory Hands on  Barriers to learning/adherence to lifestyle change: having difficulty getting in fluid and able to tolerate carbs  Demonstrated degree of understanding via:  Teach Back   Monitoring/Evaluation:  Dietary intake, exercise, lap band fills, and body weight. Follow up in 1 months for 3 month post-op visit.

## 2013-07-14 NOTE — Patient Instructions (Signed)
Goals:  Follow Phase 3B: High Protein + Non-Starchy Vegetables  Eat 3-6 small meals/snacks, every 3-5 hrs  Increase lean protein foods to meet 60g goal  Increase fluid intake to 64oz +  Avoid drinking 15 minutes before, during and 30 minutes after eating  Aim for >30 min of physical activity daily  

## 2013-07-18 ENCOUNTER — Other Ambulatory Visit: Payer: Self-pay | Admitting: Physician Assistant

## 2013-07-21 ENCOUNTER — Ambulatory Visit (INDEPENDENT_AMBULATORY_CARE_PROVIDER_SITE_OTHER): Payer: 59 | Admitting: Surgery

## 2013-07-21 ENCOUNTER — Encounter (INDEPENDENT_AMBULATORY_CARE_PROVIDER_SITE_OTHER): Payer: Self-pay | Admitting: Surgery

## 2013-07-21 VITALS — BP 134/92 | HR 68 | Temp 98.5°F | Resp 14 | Ht 66.0 in | Wt 250.0 lb

## 2013-07-21 DIAGNOSIS — Z9884 Bariatric surgery status: Secondary | ICD-10-CM

## 2013-07-21 NOTE — Patient Instructions (Signed)
Good Carbs:  Brocccoli, asparagus, spinach, mustard greens,    Intermediate:  Beans  Bad Carbs: Potatoes (white, sweet, or French Fries), carrots, squash,  

## 2013-07-21 NOTE — Progress Notes (Signed)
Amanda Davidson 43 y.o.  Body mass index is 40.37 kg/(m^2).  Patient Active Problem List   Diagnosis Date Noted  . Insulin resistance 05/24/2013  . Laparoscopic sleeve gastrectomy Dec 2014 05/14/2013  . Asthma, moderate persistent 09/18/2012  . Diabetes mellitus, type 2 09/18/2012  . Severe obesity (BMI >= 40) 09/18/2012  . Hypertension, essential, benign 06/30/2012  . Sleep apnea 05/24/2012  . Hypothyroidism 05/24/2012  . Migraine 05/24/2012  . Vitamin D deficiency 05/24/2012  . Asthma 05/24/2012  . Depression with anxiety 05/24/2012    No Known Allergies  Past Surgical History  Procedure Laterality Date  . Hernia repair    . Laparoscopic gastric sleeve resection N/A 05/14/2013    Procedure: LAPAROSCOPIC GASTRIC SLEEVE RESECTION;  Surgeon: Valarie MerinoMatthew B Teniola Tseng, MD;  Location: [facility];  Service: General;  Laterality: N/A;  . Upper gi endoscopy  05/14/2013    Procedure: UPPER GI ENDOSCOPY;  Surgeon: Valarie MerinoMatthew B Gracyn Allor, MD;  Location: [facility];  Service: General;;  . Umbilical hernia repair  05/14/2013    Procedure: HERNIA REPAIR UMBILICAL ADULT;  Surgeon: Valarie MerinoMatthew B Lansing Sigmon, MD;  Location: [facility];  Service: General;;   Caleen EssexBREEBACK, JADE, PA-C No diagnosis found.  Incisions OK.  Doing well.  Working out and changing shape more than losing weight.  Down 34 lbs thus far.    Return 2 months.  Matt B. Daphine DeutscherMartin, MD, [facility]  [facility] [phone] beeper [phone]  07/21/2013 10:03 AM

## 2013-07-26 ENCOUNTER — Encounter: Payer: Self-pay | Admitting: Physician Assistant

## 2013-07-26 ENCOUNTER — Ambulatory Visit (INDEPENDENT_AMBULATORY_CARE_PROVIDER_SITE_OTHER): Payer: 59 | Admitting: Physician Assistant

## 2013-07-26 VITALS — BP 118/72 | HR 78 | Temp 98.5°F | Ht 66.0 in | Wt 249.0 lb

## 2013-07-26 DIAGNOSIS — G4730 Sleep apnea, unspecified: Secondary | ICD-10-CM

## 2013-07-26 DIAGNOSIS — E8881 Metabolic syndrome: Secondary | ICD-10-CM

## 2013-07-26 DIAGNOSIS — R748 Abnormal levels of other serum enzymes: Secondary | ICD-10-CM

## 2013-07-26 DIAGNOSIS — E039 Hypothyroidism, unspecified: Secondary | ICD-10-CM

## 2013-07-26 LAB — COMPLETE METABOLIC PANEL WITH GFR
ALBUMIN: 3.5 g/dL (ref 3.5–5.2)
ALT: 18 U/L (ref 0–35)
AST: 14 U/L (ref 0–37)
Alkaline Phosphatase: 78 U/L (ref 39–117)
BUN: 15 mg/dL (ref 6–23)
CO2: 27 meq/L (ref 19–32)
Calcium: 9 mg/dL (ref 8.4–10.5)
Chloride: 102 mEq/L (ref 96–112)
Creat: 0.76 mg/dL (ref 0.50–1.10)
GLUCOSE: 91 mg/dL (ref 70–99)
POTASSIUM: 4.6 meq/L (ref 3.5–5.3)
Sodium: 136 mEq/L (ref 135–145)
TOTAL PROTEIN: 6.2 g/dL (ref 6.0–8.3)
Total Bilirubin: 0.4 mg/dL (ref 0.2–1.2)

## 2013-07-26 LAB — TSH: TSH: 0.615 u[IU]/mL (ref 0.350–4.500)

## 2013-07-26 LAB — POCT GLYCOSYLATED HEMOGLOBIN (HGB A1C): Hemoglobin A1C: 5.4

## 2013-07-26 NOTE — Progress Notes (Signed)
   Subjective:    Patient ID: Amanda Davidson, female    DOB: Oct 16, 1970, 43 y.o.   MRN: [id number]  HPI Pt presents to the clinic with follow up on insulin resistence. After having gastric sleeve procedure her sugars have gotten better and better. Fasting sugars in am are 86-112. She is not currently exercising but she is limiting her portion sizes and trying to keep carbs very low. Denies any hypoglycemic events. She is not on any medication currently.   HTN- looking great. No problems. Denies any CP, palpitations, SOB, headaches or vision changes.     Review of Systems     Objective:   Physical Exam  Constitutional: She is oriented to person, place, and time. She appears well-developed and well-nourished.  HENT:  Head: Normocephalic and atraumatic.  Neck: Normal range of motion. Neck supple. No thyromegaly present.  Cardiovascular: Normal rate, regular rhythm and normal heart sounds.   Pulmonary/Chest: Effort normal and breath sounds normal. She has no wheezes.  Neurological: She is alert and oriented to person, place, and time.  Psychiatric: She has a normal mood and affect. Her behavior is normal.          Assessment & Plan:  Insulin resistence- A!C was 5.4 continues to improve. Will check in 6 months. Continue to work on diet and add in exercise regularly. Goal of one pound a week until goal weight loss of 50lbs to get to 200lb weight.   hypothyroidisim- will recheck tSH last lab was on low normal side.   Elevated liver enzymes- will recheck today from last labs.   Sleep apnea- pt is not wearing cpap. Discussed importance of wearing to improve energy and maximize weight loss.   HTN- controlled looking great.

## 2013-08-18 ENCOUNTER — Encounter: Payer: 59 | Attending: General Surgery | Admitting: Dietician

## 2013-08-18 DIAGNOSIS — Z01818 Encounter for other preprocedural examination: Secondary | ICD-10-CM | POA: Insufficient documentation

## 2013-08-18 DIAGNOSIS — E669 Obesity, unspecified: Secondary | ICD-10-CM | POA: Insufficient documentation

## 2013-08-18 NOTE — Progress Notes (Signed)
  Follow-up visit:  12 Weeks Post-Operative Sleeve Surgery  Medical Nutrition Therapy:  Appt start time: 1230 end time:  100.  Primary concerns today: Post-operative Bariatric Surgery Nutrition Management. Returns today with a 3 lb weight loss and 8.5 lb fat loss. States that eggs can be hard to tolerate sometimes. Does strength training 2 x week and cardio 4 x week for 30 minutes. Feels very hungry the days after she works out. Feels good and trying not to get frustrated about not losing weight quickly.   Checking blood sugar 1 x week and averaging 91-92 mg/dl fasting.   Having a lot of stress since her father recently was in the hospital but says things are getting back to normal.   Surgery date: 05/31/13 Surgery type: Sleeve [facility]  Weight today: 246.5 lbs  Weight change: 3 lbs, 8.5 lbs fat loss Total weight lost: 37.5 lbs  TANITA  BODY COMP RESULTS  05/31/13 07/14/13 08/18/13   BMI (kg/m^2) 42.2 40.3 39.8   Fat Mass (lbs) 133.5 120.0 111.5   Fat Free Mass (lbs) 128.0 129.5 135.0   Total Body Water (lbs) 93.5 95.0 99.0    Preferred Learning Style:   No preference indicated   Learning Readiness:   Ready  24-hr recall: B (AM): EAS protein shake or Premier Protein (17-30 g) Snk (AM): Yogurt (12 g) or string cheese or 1-2 oz lunch meat (3-12 g) L (PM): 3 oz chicken or cheeseburger pie (21 g) Snk (PM): Yogurt (12 g) or string cheese or 1-2 oz lunch meat (3-12 g) D (PM): 4 oz of protein + vegetable (28 g) Snk (PM): none  Fluid intake: 40 oz water + protein shake (50 oz) Estimated total protein intake: ~70 g  Medications: no longer taking blood pressure, diabetes, or depression meds Supplementation: taking though forgets to take second dose of calcium or MVI sometimes  Using straws: No Drinking while eating: No Hair loss: in the last week noticed some Carbonated beverages: No N/V/D/C: No Dumping syndrome: No   Recent physical activity:   Does strength training 2 x week and cardio 4 x week for 30 minutes.   Progress Towards Goal(s):  In progress.    Nutritional Diagnosis:  [place]-3.3 Overweight/obesity related to past poor dietary habits and physical inactivity as evidenced by patient w/ recent Sleeve surgery following dietary guidelines for continued weight loss.    Intervention:  Nutrition education/diet advancement.  Teaching Method Utilized:  Visual Auditory Hands on  Barriers to learning/adherence to lifestyle change: having difficulty getting in fluid and able to tolerate carbs  Demonstrated degree of understanding via:  Teach Back   Monitoring/Evaluation:  Dietary intake, exercise, and body weight. Follow up in 3 months for 6 month post-op visit.

## 2013-08-18 NOTE — Patient Instructions (Addendum)
Goals:  Follow Phase 3B: High Protein + Non-Starchy Vegetables  Eat 3-6 small meals/snacks, every 3-5 hrs  Increase lean protein foods to meet 60g goal  Increase fluid intake to 64oz +  Avoid drinking 15 minutes before, during and 30 minutes after eating  Aim for >30 min of physical activity daily  Put the scale away and instead consider measuring yourself with a tape measure, notice how your clothes feel, pay attention to improvements in the gym, and notice your energy level  Add vegetables to meals especially when your hungry  TANITA  BODY COMP RESULTS  05/31/13 07/14/13 08/18/13   BMI (kg/m^2) 42.2 40.3 39.8   Fat Mass (lbs) 133.5 120.0 111.5   Fat Free Mass (lbs) 128.0 129.5 135.0   Total Body Water (lbs) 93.5 95.0 99.0

## 2013-08-23 ENCOUNTER — Other Ambulatory Visit: Payer: Self-pay | Admitting: Physician Assistant

## 2013-09-11 ENCOUNTER — Other Ambulatory Visit: Payer: Self-pay | Admitting: Physician Assistant

## 2013-09-22 ENCOUNTER — Ambulatory Visit (INDEPENDENT_AMBULATORY_CARE_PROVIDER_SITE_OTHER): Payer: 59 | Admitting: Surgery

## 2013-09-22 ENCOUNTER — Encounter (INDEPENDENT_AMBULATORY_CARE_PROVIDER_SITE_OTHER): Payer: Self-pay | Admitting: Surgery

## 2013-09-22 VITALS — BP 122/82 | HR 72 | Temp 97.8°F | Resp 16 | Ht 66.0 in | Wt 238.6 lb

## 2013-09-22 DIAGNOSIS — Z9884 Bariatric surgery status: Secondary | ICD-10-CM

## 2013-09-22 NOTE — Progress Notes (Signed)
Amanda Davidson 43 y.o.  Body mass index is 38.53 kg/(m^2).  Patient Active Problem List   Diagnosis Date Noted  . Insulin resistance 05/24/2013  . Laparoscopic sleeve gastrectomy Dec 2014 05/14/2013  . Asthma, moderate persistent 09/18/2012  . Diabetes mellitus, type 2 09/18/2012  . Severe obesity (BMI >= 40) 09/18/2012  . Hypertension, essential, benign 06/30/2012  . Sleep apnea 05/24/2012  . Hypothyroidism 05/24/2012  . Migraine 05/24/2012  . Vitamin D deficiency 05/24/2012  . Asthma 05/24/2012  . Depression with anxiety 05/24/2012    No Known Allergies  Past Surgical History  Procedure Laterality Date  . Hernia repair    . Laparoscopic gastric sleeve resection N/A 05/14/2013    Procedure: LAPAROSCOPIC GASTRIC SLEEVE RESECTION;  Surgeon: Valarie MerinoMatthew B Ciani Rutten, MD;  Location: [facility];  Service: General;  Laterality: N/A;  . Upper gi endoscopy  05/14/2013    Procedure: UPPER GI ENDOSCOPY;  Surgeon: Valarie MerinoMatthew B Danni Leabo, MD;  Location: [facility];  Service: General;;  . Umbilical hernia repair  05/14/2013    Procedure: HERNIA REPAIR UMBILICAL ADULT;  Surgeon: Valarie MerinoMatthew B Sharvil Hoey, MD;  Location: [facility];  Service: General;;   Caleen EssexBREEBACK, JADE, PA-C No diagnosis found.  Hernia repair intact.  Off insulin.  Off antihypertensive.  Down 47 lbs since surgery.   Return 4 months Matt B. Daphine DeutscherMartin, MD, [facility]  [facility] [phone] beeper [phone]  09/22/2013 9:40 AM

## 2013-09-22 NOTE — Patient Instructions (Signed)
Thanks for your patience.  If you need further assistance after leaving the office, please call our office and speak with a [facility] nurse.  [phone].  If you want to leave a message for Dr. Ryann Leavitt, please call his office phone at [phone]. 

## 2013-11-15 ENCOUNTER — Encounter: Payer: 59 | Attending: Surgery | Admitting: Dietician

## 2013-11-15 DIAGNOSIS — Z713 Dietary counseling and surveillance: Secondary | ICD-10-CM | POA: Diagnosis not present

## 2013-11-15 DIAGNOSIS — Z9884 Bariatric surgery status: Secondary | ICD-10-CM | POA: Diagnosis not present

## 2013-11-15 NOTE — Patient Instructions (Addendum)
Goals:  Follow Phase 3B: High Protein + Non-Starchy Vegetables  Eat 3-6 small meals/snacks, every 3-5 hrs  Increase lean protein foods to meet 60g goal  Increase fluid intake to 64oz +  Avoid drinking 15 minutes before, during and 30 minutes after eating  Aim for >30 min of physical activity daily  Add vegetables to meals especially when your hungry  Eat meals slowly (download ap)  For snacks, choose protein (beef jerky) that you have chew (instead of yogurt) and vegetables

## 2013-11-15 NOTE — Progress Notes (Signed)
  Follow-up visit:  6 Months Post-Operative Sleeve Surgery  Medical Nutrition Therapy:  Appt start time: 830 end time:  900.  Primary concerns today: Post-operative Bariatric Surgery Nutrition Management. Returns today with a 12 lb weight loss. States she has been traveling and has had trouble following. Weight loss has been stalled for about 4 weeks. Had been drinking alcohol on the weekends. Started making changes to her diet last week such as no longer drinking and grilling meat and weight has started coming off again. Feels like she is back on track. Still exercising and trying to increase jogging. Mows the grass with a push mower. Still feels hungry, especially after working out.   Checking blood sugar 1 x week and averaging 83-89 mg/dl fasting.   Having a lot of stress at work lately.   Surgery date: 05/31/13 Surgery type: [facility]ve [facility] Weight today: 234.5 lbs  Weight change:12 lbs Total weight lost: 49.5 lbs  TANITA  BODY COMP RESULTS  05/31/13 07/14/13 08/18/13 11/15/13   BMI (kg/m^2) 42.2 40.3 39.8 37.9   Fat Mass (lbs) 133.5 120.0 111.5 107.5   Fat Free Mass (lbs) 128.0 129.5 135.0 127.0   Total Body Water (lbs) 93.5 95.0 99.0 93.0    Preferred Learning Style:   No preference indicated   Learning Readiness:   Ready  24-hr recall: B (AM): EAS protein shake or Premier Protein (17-30 g) Snk (AM): Yogurt (12 g) or string cheese or 1-2 oz lunch meat (3-12 g) L (PM): 3 oz chicken or cheeseburger pie (21 g) Snk (PM): Yogurt (12 g) or string cheese or 1-2 oz lunch meat (3-12 g) D (PM): 4 oz of protein + vegetable (28 g) Snk (PM): none  Fluid intake: 40 oz water + protein shake (50 oz) Estimated total protein intake: ~70 g  Medications: no longer taking blood pressure, diabetes, or depression meds Supplementation: taking though forgets to take second dose of calcium or MVI sometimes  Using straws: No Drinking while eating: No Hair loss:  getting better Carbonated beverages: No N/V/D/C: Will sometimes have diarrhea if tries sweets Dumping syndrome: No   Recent physical activity:  Does strength training 2 x week and cardio 5 x week for 30 minutes.   Progress Towards Goal(s):  In progress.    Nutritional Diagnosis:  [place]-3.3 Overweight/obesity related to past poor dietary habits and physical inactivity as evidenced by patient w/ recent Sleeve surgery following dietary guidelines for continued weight loss.    Intervention:  Nutrition education/diet enforcement  Teaching Method Utilized:  Visual Auditory Hands on  Barriers to learning/adherence to lifestyle change: having difficulty getting in fluid and able to tolerate carbs  Demonstrated degree of understanding via:  Teach Back   Monitoring/Evaluation:  Dietary intake, exercise, and body weight. Follow up in 3 months for 9 month post-op visit.

## 2013-11-17 ENCOUNTER — Other Ambulatory Visit: Payer: Self-pay | Admitting: Physician Assistant

## 2013-11-19 ENCOUNTER — Other Ambulatory Visit: Payer: Self-pay | Admitting: Physician Assistant

## 2013-11-22 ENCOUNTER — Other Ambulatory Visit: Payer: Self-pay | Admitting: Physician Assistant

## 2013-11-22 MED ORDER — SUMATRIPTAN SUCCINATE 100 MG PO TABS: ORAL_TABLET | ORAL | Status: DC

## 2013-12-13 ENCOUNTER — Telehealth: Payer: Self-pay | Admitting: *Deleted

## 2013-12-13 NOTE — Telephone Encounter (Signed)
I called and lmom for Amanda Davidson to return my call regarding her sumatriptan refill request and to verify the pharmacy. I have a question if the imitrex isn't working maybe she needs another appt. It appears that her pharmacy is requesting a refill and 3 refills were granted when this was sent on 11/22/13. Corliss SkainsJamie Kaytlyn Din, CMA.

## 2013-12-17 ENCOUNTER — Other Ambulatory Visit: Payer: Self-pay | Admitting: Physician Assistant

## 2013-12-19 ENCOUNTER — Telehealth: Payer: Self-pay

## 2013-12-19 NOTE — Telephone Encounter (Signed)
[facility] received a ref[facility] for [facility]. The chart shows a refill for Imitrex was created on 11/22/2013 with 3 refills to [facility]. I spoke with patient so she would be aware that she has refills of the Imitrex at [facility] and if she no longer wanted to go to [facility] for her refills she could call [facility] and have them contact [facility] for a transfer of medication. [facility] then [facility].

## 2013-12-23 ENCOUNTER — Telehealth: Payer: Self-pay | Admitting: *Deleted

## 2013-12-23 NOTE — Telephone Encounter (Signed)
Spoke with [facility] and their formulary only allows 9 imitrex filled q25 days. Pharmacy was notified. Corliss SkainsJamie Janiel Derhammer, CMA

## 2014-01-17 ENCOUNTER — Other Ambulatory Visit: Payer: Self-pay | Admitting: *Deleted

## 2014-01-17 ENCOUNTER — Ambulatory Visit: Payer: 59 | Admitting: Dietician

## 2014-01-17 MED ORDER — MONTELUKAST SODIUM 10 MG PO TABS: ORAL_TABLET | ORAL | Status: DC

## 2014-01-20 ENCOUNTER — Ambulatory Visit (INDEPENDENT_AMBULATORY_CARE_PROVIDER_SITE_OTHER): Payer: 59 | Admitting: Surgery

## 2014-01-24 ENCOUNTER — Encounter: Payer: Self-pay | Admitting: Physician Assistant

## 2014-01-24 ENCOUNTER — Ambulatory Visit (INDEPENDENT_AMBULATORY_CARE_PROVIDER_SITE_OTHER): Payer: 59 | Admitting: Physician Assistant

## 2014-01-24 VITALS — BP 116/66 | HR 70 | Ht 66.0 in | Wt 238.0 lb

## 2014-01-24 DIAGNOSIS — E8881 Metabolic syndrome: Secondary | ICD-10-CM

## 2014-01-24 DIAGNOSIS — Z23 Encounter for immunization: Secondary | ICD-10-CM

## 2014-01-24 DIAGNOSIS — E039 Hypothyroidism, unspecified: Secondary | ICD-10-CM

## 2014-01-24 LAB — COMPLETE METABOLIC PANEL WITH GFR
ALT: 15 U/L (ref 0–35)
AST: 14 U/L (ref 0–37)
Albumin: 3.8 g/dL (ref 3.5–5.2)
Alkaline Phosphatase: 70 U/L (ref 39–117)
BUN: 17 mg/dL (ref 6–23)
CALCIUM: 9.4 mg/dL (ref 8.4–10.5)
CHLORIDE: 103 meq/L (ref 96–112)
CO2: 27 meq/L (ref 19–32)
CREATININE: 0.72 mg/dL (ref 0.50–1.10)
GFR, Est African American: >89 mL/min
GFR, Est Non African American: >89 mL/min
Glucose, Bld: 85 mg/dL (ref 70–99)
Potassium: 4.8 mEq/L (ref 3.5–5.3)
Sodium: 137 mEq/L (ref 135–145)
Total Bilirubin: 0.5 mg/dL (ref 0.2–1.2)
Total Protein: 6.6 g/dL (ref 6.0–8.3)

## 2014-01-24 LAB — T4, FREE: Free T4: 1.28 ng/dL (ref 0.80–1.80)

## 2014-01-24 LAB — TSH: TSH: 1.195 u[IU]/mL (ref 0.350–4.500)

## 2014-01-24 LAB — POCT GLYCOSYLATED HEMOGLOBIN (HGB A1C): Hemoglobin A1C: 5.3

## 2014-01-24 LAB — T3, FREE: T3 FREE: 2.6 pg/mL (ref 2.3–4.2)

## 2014-01-24 NOTE — Progress Notes (Signed)
   Subjective:    Patient ID: Amanda Davidson, female    DOB: 12-May-1971, 43 y.o.   MRN: [id number]  HPI Patient is a 43 year old female who presents to the clinic for 6 month followup.  Insulin resistance-patient did have gastric bypass and has not needed to be on any medications for sugar control. She continues to check her fasting glucose which runs around 80-90 every morning. She has continued to lose weight but seems to have plateaued over the past couple months. She continues to exercise daily. She still feels great. She denies any hypothalamic of it.  Hypothyroidism-continues to take levothyroxin daily. NO Concerns or complaints today.   Review of Systems  All other systems reviewed and are negative.      Objective:   Physical Exam  Constitutional: She is oriented to person, place, and time. She appears well-developed and well-nourished.  Obese.   HENT:  Head: Normocephalic and atraumatic.  Cardiovascular: Normal rate, regular rhythm and normal heart sounds.   Pulmonary/Chest: Effort normal and breath sounds normal. She has no wheezes.  Neurological: She is alert and oriented to person, place, and time.  Skin: Skin is dry.  Psychiatric: She has a normal mood and affect. Her behavior is normal.          Assessment & Plan:  Insulin resistance- .Marland Kitchen Lab Results  Component Value Date   HGBA1C 5.3 01/24/2014   Looking good not currently on any medications.  Follow up in 1 year.   Hypothyroidism- will recheck labs today. Will adjust medication accordingly.   Needs' CPE in November.    Flu shot given without complications.

## 2014-01-25 ENCOUNTER — Other Ambulatory Visit: Payer: Self-pay

## 2014-01-25 MED ORDER — LEVOTHYROXINE SODIUM 137 MCG PO TABS: 137.0000 ug | ORAL_TABLET | Freq: Every day | ORAL | Status: DC

## 2014-02-09 ENCOUNTER — Ambulatory Visit (INDEPENDENT_AMBULATORY_CARE_PROVIDER_SITE_OTHER): Payer: 59 | Admitting: Surgery

## 2014-02-10 ENCOUNTER — Ambulatory Visit (INDEPENDENT_AMBULATORY_CARE_PROVIDER_SITE_OTHER): Payer: 59 | Admitting: Surgery

## 2014-02-24 ENCOUNTER — Encounter: Payer: 59 | Attending: Surgery | Admitting: Dietician

## 2014-02-24 DIAGNOSIS — Z713 Dietary counseling and surveillance: Secondary | ICD-10-CM | POA: Insufficient documentation

## 2014-02-24 DIAGNOSIS — Z6839 Body mass index (BMI) 39.0-39.9, adult: Secondary | ICD-10-CM | POA: Diagnosis not present

## 2014-02-24 DIAGNOSIS — E119 Type 2 diabetes mellitus without complications: Secondary | ICD-10-CM | POA: Diagnosis not present

## 2014-02-24 DIAGNOSIS — Z9884 Bariatric surgery status: Secondary | ICD-10-CM | POA: Diagnosis not present

## 2014-02-24 NOTE — Patient Instructions (Addendum)
Goals:  Follow Phase 3B: High Protein + Non-Starchy Vegetables  Eat 3-6 small meals/snacks, every 3-5 hrs  Increase lean protein foods to meet 60g goal  Increase fluid intake to 64oz +  Avoid drinking 15 minutes before, during and 30 minutes after eating  Aim for >30 min of physical activity daily  Add vegetables to meals especially when your hungry (think about buying the pre-washed salad greens)  Eat meals slowly (download ap)  Try roasted edamame, pumpkin seeds, or chick peas for a snack  Eat meals and snacks at the table with no distractions  Start logging food again on MyFitness Pal  Surgery date: 05/31/13 Surgery type: Sleeve [facility] weight at [facility]: 284 lbs Weight today: 244 lbs  Weight change:9.5 lbs gain, though lost 2.5 lbs of fat  Total weight lost: 40 lbs  TANITA  BODY COMP RESULTS  05/31/13 07/14/13 08/18/13 11/15/13 02/24/14   BMI (kg/m^2) 42.2 40.3 39.8 37.9 39.4   Fat Mass (lbs) 133.5 120.0 111.5 107.5 105.0   Fat Free Mass (lbs) 128.0 129.5 135.0 127.0 139.0   Total Body Water (lbs) 93.5 95.0 99.0 93.0 101.5

## 2014-02-24 NOTE — Progress Notes (Signed)
Follow-up visit:  9 Months Post-Operative Sleeve Surgery  Medical Nutrition Therapy:  Appt start time: 230 end time:  300.  Primary concerns today: Post-operative Bariatric Surgery Nutrition Management. Returns today with a 9.5 lb weight gain. Has lost 2.5 lbs of fat though. Has had a lot of stress d/t job. Feels like she is starting to emotionally eat again. Planning to go see Dr. Jana Hakim again soon. Goes to support group usually but was not able to go in the last 2 months. Eggs don't sit too well with her but tolerating other foods ok.   Checking blood sugar 1 x week and averaging 83-89 mg/dl fasting (blood sugar has been higher lately).   Surgery date: 05/31/13 Surgery type: Sleeve Starting weight at [facility]: [facility] lbs Weight today: 244 lbs  Weight change:9.5 lbs gain, though lost 2.5 lbs of fat  Total weight lost: 40 lbs  TANITA  BODY COMP RESULTS  05/31/13 07/14/13 08/18/13 11/15/13 02/24/14   BMI (kg/m^2) 42.2 40.3 39.8 37.9 39.4   Fat Mass (lbs) 133.5 120.0 111.5 107.5 105.0   Fat Free Mass (lbs) 128.0 129.5 135.0 127.0 139.0   Total Body Water (lbs) 93.5 95.0 99.0 93.0 101.5    Preferred Learning Style:   No preference indicated   Learning Readiness:   Ready  24-hr recall: B (AM): EAS protein shake or Premier Protein or protein powder in decaf coffee (17-30 g) Snk (AM): Yogurt (12 g) or string cheese or 1-2 oz lunch meat (3-12 g) or nuts L (PM): 4 oz chicken/roast beef or cheeseburger pie with salad(28 g) Snk (PM): Yogurt (12 g) or string cheese or 1-2 oz lunch meat (3-12 g) D (PM): 4 oz of protein + vegetable (28 g) Snk (PM): sometimes will grab chips or sugar free jello  Fluid intake: 60 oz water + protein shake at least 64 oz most days Estimated total protein intake: ~70 g  Medications: no longer taking blood pressure, diabetes, or depression meds Supplementation: taking   Using straws: No Drinking while eating: has a couple tips (tries not to) Hair loss:  No Carbonated beverages: No N/V/D/C: has some loose stools when has a lot of protein shakes Dumping syndrome: No   Recent physical activity:  Walk/job/zumba about 5 x week  Does strength training with a trainer 2 x week   Progress Towards Goal(s):  In progress.    Nutritional Diagnosis:  [place]-3.3 Overweight/obesity related to past poor dietary habits and physical inactivity as evidenced by patient w/ recent Sleeve surgery following dietary guidelines for continued weight loss.    Intervention:  Nutrition education/diet enforcement  Goals:  Follow Phase 3B: High Protein + Non-Starchy Vegetables  Eat 3-6 small meals/snacks, every 3-5 hrs  Increase lean protein foods to meet 60g goal  Increase fluid intake to 64oz +  Avoid drinking 15 minutes before, during and 30 minutes after eating  Aim for >30 min of physical activity daily  Add vegetables to meals especially when your hungry (think about buying the pre-washed salad greens)  Eat meals slowly (download ap)  Try roasted edamame, pumpkin seeds, or chick peas for a snack  Eat meals and snacks at the table with no distractions  Start logging food again on MyFitness Pal   Teaching Method Utilized:  Visual Auditory Hands on  Barriers to learning/adherence to lifestyle change: able to tolerate carbs, emotional eating  Demonstrated degree of understanding via:  Teach Back   Monitoring/Evaluation:  Dietary intake, exercise, and body weight. Follow up in  4-6 weeks for 10 month post-op visit.

## 2014-04-12 ENCOUNTER — Ambulatory Visit: Payer: 59 | Admitting: Dietician

## 2014-05-30 ENCOUNTER — Encounter: Payer: 59 | Attending: Surgery | Admitting: Dietician

## 2014-05-30 DIAGNOSIS — E669 Obesity, unspecified: Secondary | ICD-10-CM | POA: Insufficient documentation

## 2014-05-30 DIAGNOSIS — Z6841 Body Mass Index (BMI) 40.0 and over, adult: Secondary | ICD-10-CM | POA: Diagnosis not present

## 2014-05-30 DIAGNOSIS — Z713 Dietary counseling and surveillance: Secondary | ICD-10-CM | POA: Insufficient documentation

## 2014-05-30 DIAGNOSIS — R635 Abnormal weight gain: Secondary | ICD-10-CM | POA: Diagnosis present

## 2014-05-30 NOTE — Progress Notes (Signed)
Follow-up visit:  12 Months Post-Operative Sleeve Surgery  Medical Nutrition Therapy:  Appt start time: 850 end time:  920  Primary concerns today: Post-operative Bariatric Surgery Nutrition Management.   Amanda KaufmannMelissa returns today discouraged about her weight gain. She reports that she has had a lot of recent stress. Stopped tracking her food but started a boot camp class [facility] 6 weeks ago. Saw Dr. Cyndia SkeetersLurey again and states it was helpful but she cannot afford to continue seeing him. She states she is also letting "carbs creep back in" and has been grazing. Getting tired of protein shakes. However, her blood sugars and lipid levels are within normal range. Adely reports that she realizes that when she pre plans her meals, eats protein foods frequently, and logs her foods, she is more successful.   Surgery date: 05/31/13 Surgery type: Sleeve [facility] Weight today: 253 lbs  Weight change: 7 lbs gain Total weight lost: 31 lbs  TANITA  BODY COMP RESULTS  05/31/13 07/14/13 08/18/13 11/15/13 02/24/14 05/30/14   BMI (kg/m^2) 42.2 40.3 39.8 37.9 39.4 40.9   Fat Mass (lbs) 133.5 120.0 111.5 107.5 105.0 121.5   Fat Free Mass (lbs) 128.0 129.5 135.0 127.0 139.0 132   Total Body Water (lbs) 93.5 95.0 99.0 93.0 101.5 96.5    Preferred Learning Style:   No preference indicated   Learning Readiness:   Ready  24-hr recall: B (AM): EAS protein shake or Premier Protein or protein powder in decaf coffee (17-30 g) Snk (AM): Yogurt (12 g) or string cheese or 1-2 oz lunch meat (3-12 g) or nuts L (PM): 4 oz chicken/roast beef or cheeseburger pie with salad(28 g) Snk (PM): Yogurt (12 g) or string cheese or 1-2 oz lunch meat (3-12 g) D (PM): 4 oz of protein + vegetable (28 g) Snk (PM): sometimes will grab chips or sugar free jello  Fluid intake: 60 oz water + protein shake at least 64 oz most days Estimated total protein intake: ~70 g  Medications: no longer taking blood  pressure, diabetes, or depression meds Supplementation: taking   Using straws: No Drinking while eating: has a couple tips (tries not to) Hair loss: No Carbonated beverages: No N/V/D/C: has some loose stools when has a lot of protein shakes Dumping syndrome: No   Recent physical activity:  Boot camp at [facility] 2x a week; Zumba or walking 2x a week  Progress Towards Goal(s):  In progress.    Nutritional Diagnosis:  [place]-3.3 Overweight/obesity related to past poor dietary habits and physical inactivity as evidenced by patient w/ recent Sleeve surgery following dietary guidelines for continued weight loss.    Intervention:  Nutrition education/diet enforcement.  Goals:  Eat 3-6 small meals/snacks, every 3-5 hrs  Increase fluid intake to 64oz +  Avoid drinking 15 minutes before, during and 30 minutes after eating  Add vegetables to meals especially when your hungry (think about buying the pre-washed salad greens)  Salad, green beans, peppers, onion, mushrooms, carrots, tomatoes  Try Skinny Girl dressing  Steamer bags of veggies  Eat meals slowly (download app)  Try roasted edamame, pumpkin seeds, or chick peas for a snack  Eat meals and snacks at the table with no distractions  Start logging food again on MyFitness Pal  Develop a routine and include foods in this plan  Take a list to the grocery store  Pre-plan meals  Keep healthy snacks on hand at work - avoid getting too hungry  Try [employer]  protein chips and PB2 cups  Teaching Method Utilized:  Visual Auditory  Barriers to learning/adherence to lifestyle change: able to tolerate carbs, emotional eating  Demonstrated degree of understanding via:  Teach Back   Monitoring/Evaluation:  Dietary intake, exercise, and body weight. Follow up in 2 months for 14 month post-op visit.

## 2014-05-30 NOTE — Patient Instructions (Addendum)
Goals:  Eat 3-6 small meals/snacks, every 3-5 hrs  Increase fluid intake to 64oz +  Avoid drinking 15 minutes before, during and 30 minutes after eating  Add vegetables to meals especially when your hungry (think about buying the pre-washed salad greens)  Salad, green beans, peppers, onion, mushrooms, carrots, tomatoes  Try Skinny Girl dressing  Steamer bags of veggies  Eat meals slowly (download app)  Try roasted edamame, pumpkin seeds, or chick peas for a snack  Eat meals and snacks at the table with no distractions  Start logging food again on MyFitness Pal  Develop a routine and include foods in this plan  Take a list to the grocery store  Pre-plan meals  Keep healthy snacks on hand at work - avoid getting too hungry  Try Quest protein chips and PB2 cups

## 2014-07-02 ENCOUNTER — Other Ambulatory Visit: Payer: Self-pay | Admitting: Physician Assistant

## 2014-07-04 NOTE — Telephone Encounter (Signed)
Appointment specifically for f/u migraine needed before future refills

## 2014-07-27 ENCOUNTER — Ambulatory Visit: Payer: 59 | Admitting: Dietician

## 2014-07-27 ENCOUNTER — Encounter: Payer: 59 | Admitting: Physician Assistant

## 2014-08-15 ENCOUNTER — Encounter: Payer: BLUE CROSS/BLUE SHIELD | Attending: Surgery | Admitting: Dietician

## 2014-08-15 DIAGNOSIS — Z713 Dietary counseling and surveillance: Secondary | ICD-10-CM | POA: Insufficient documentation

## 2014-08-15 DIAGNOSIS — E669 Obesity, unspecified: Secondary | ICD-10-CM | POA: Insufficient documentation

## 2014-08-15 DIAGNOSIS — R635 Abnormal weight gain: Secondary | ICD-10-CM | POA: Diagnosis present

## 2014-08-15 DIAGNOSIS — Z6841 Body Mass Index (BMI) 40.0 and over, adult: Secondary | ICD-10-CM | POA: Diagnosis not present

## 2014-08-15 NOTE — Progress Notes (Signed)
Follow-up visit:  12 Months Post-Operative Sleeve Surgery  Medical Nutrition Therapy:  Appt start time: 1030 end time:  1100  Primary concerns today: Post-operative Bariatric Surgery Nutrition Management. Still has a lot of stress. Doesn't feel like tracking her food when she is feeling stress. Eggs still her nauseas.  Amanda Davidson reports that she realizes that when she pre plans her meals, eats protein foods frequently, and logs her foods, she is more successful.  Money has been tight though it is better than before.  Surgery date: 05/31/13 Surgery type: Sleeve [facility]: [facility] lbs Weight today: 252 lbs  Weight change: 1 lbs gloss Total weight lost: 32 lbs  TANITA  BODY COMP RESULTS  05/31/13 07/14/13 08/18/13 11/15/13 02/24/14 05/30/14 08/15/14   BMI (kg/m^2) 42.2 40.3 39.8 37.9 39.4 40.9 40.7   Fat Mass (lbs) 133.5 120.0 111.5 107.5 105.0 121.5 121.5   Fat Free Mass (lbs) 128.0 129.5 135.0 127.0 139.0 132 130.5   Total Body Water (lbs) 93.5 95.0 99.0 93.0 101.5 96.5 95.5    Preferred Learning Style:   No preference indicated   Learning Readiness:   Ready  24-hr recall: B (AM): EAS protein shake or Premier Protein or protein powder in decaf coffee (17-30 g) Snk (AM): Yogurt (12 g) or string cheese or 1-2 oz lunch meat (3-12 g) or nuts L (PM): 4 oz chicken/roast beef or cheeseburger pie with salad(28 g) Snk (PM): Yogurt (12 g) or string cheese or 1-2 oz lunch meat (3-12 g) D (PM): 4 oz of protein + vegetable (28 g) Snk (PM): sometimes will grab chips or sugar free jello  Fluid intake: 30-100 oz water, Diet cranberry juice, + protein shake at least 64 oz most days Estimated total protein intake: ~70 g  Medications: no longer taking blood pressure, diabetes, or depression meds Supplementation: not taking  Using straws: No Drinking while eating: has a couple sips (drinks that before 30 minutes is over) Hair loss: No Carbonated beverages: No N/V/D/C: has some  loose stools when has a lot of protein shakes Dumping syndrome: No   Recent physical activity:  exercising 4 x week  Progress Towards Goal(s):  In progress.    Nutritional Diagnosis:  [place]-3.3 Overweight/obesity related to past poor dietary habits and physical inactivity as evidenced by patient w/ recent Sleeve surgery following dietary guidelines for continued weight loss.    Intervention:  Nutrition education/diet reniforcement.  Goals:  Eat 3-6 small meals/snacks, every 3-5 hrs  Increase fluid intake to 64oz +  Avoid drinking 15 minutes before, during and 30 minutes after eating  Eat protein first, then non starchy vegetables, and have high fiber carbs only if you are still hungry  If you eat 3 oz of protein 3 x day, you are meeting your protein goal  Add vegetables to meals especially when your hungry (think about buying the pre-washed salad greens)  Salad, green beans, peppers, onion, mushrooms, carrots, tomatoes  Steamer bags of veggies  Plan out meals and snacks on Sunday - put food plan in your calendar  Think about logging food   Keep healthy snacks on hand at work - avoid getting too hungry  Think about attending support group again  Teaching Method Utilized:  Visual Auditory  Barriers to learning/adherence to lifestyle change: able to tolerate carbs, emotional eating  Demonstrated degree of understanding via:  Teach Back   Monitoring/Evaluation:  Dietary intake, exercise, and body weight. Follow up in 2 months for 17 month post-op visit.

## 2014-08-15 NOTE — Patient Instructions (Addendum)
Goals:  Eat 3-6 small meals/snacks, every 3-5 hrs  Increase fluid intake to 64oz +  Avoid drinking 15 minutes before, during and 30 minutes after eating  Eat protein first, then non starchy vegetables, and have high fiber carbs only if you are still hungry  If you eat 3 oz of protein 3 x day, you are meeting your protein goal  Add vegetables to meals especially when your hungry (think about buying the pre-washed salad greens)  Salad, green beans, peppers, onion, mushrooms, carrots, tomatoes  Steamer bags of veggies  Plan out meals and snacks on Sunday - put food plan in your calendar  Think about logging food   Keep healthy snacks on hand at work - avoid getting too hungry  Think about attending support group again

## 2014-08-16 ENCOUNTER — Encounter: Payer: Self-pay | Admitting: Physician Assistant

## 2014-08-22 ENCOUNTER — Telehealth: Payer: Self-pay | Admitting: Physician Assistant

## 2014-08-22 ENCOUNTER — Ambulatory Visit (INDEPENDENT_AMBULATORY_CARE_PROVIDER_SITE_OTHER): Payer: BLUE CROSS/BLUE SHIELD | Admitting: Physician Assistant

## 2014-08-22 ENCOUNTER — Encounter: Payer: Self-pay | Admitting: Physician Assistant

## 2014-08-22 VITALS — BP 129/79 | HR 73 | Wt 255.0 lb

## 2014-08-22 DIAGNOSIS — E8881 Metabolic syndrome: Secondary | ICD-10-CM

## 2014-08-22 DIAGNOSIS — Z13220 Encounter for screening for lipoid disorders: Secondary | ICD-10-CM | POA: Diagnosis not present

## 2014-08-22 DIAGNOSIS — E88819 Insulin resistance, unspecified: Secondary | ICD-10-CM

## 2014-08-22 DIAGNOSIS — G43009 Migraine without aura, not intractable, without status migrainosus: Secondary | ICD-10-CM

## 2014-08-22 DIAGNOSIS — E039 Hypothyroidism, unspecified: Secondary | ICD-10-CM

## 2014-08-22 DIAGNOSIS — J4520 Mild intermittent asthma, uncomplicated: Secondary | ICD-10-CM

## 2014-08-22 DIAGNOSIS — Z9884 Bariatric surgery status: Secondary | ICD-10-CM

## 2014-08-22 DIAGNOSIS — Z0000 Encounter for general adult medical examination without abnormal findings: Secondary | ICD-10-CM | POA: Diagnosis not present

## 2014-08-22 MED ORDER — SUMATRIPTAN SUCCINATE 100 MG PO TABS: ORAL_TABLET | ORAL | Status: DC

## 2014-08-22 MED ORDER — MONTELUKAST SODIUM 10 MG PO TABS: ORAL_TABLET | ORAL | Status: DC

## 2014-08-22 NOTE — Patient Instructions (Signed)

## 2014-08-22 NOTE — Telephone Encounter (Signed)
Received fax for sumatriptan Succinate sent through cover my meds waiting on auth. - CF

## 2014-08-22 NOTE — Progress Notes (Signed)
Left Rx on [facility] voicemail for Rx to be filled. Callback provided for any further questions.

## 2014-08-23 NOTE — Telephone Encounter (Signed)
Received fax for Sumatriptan and medication was denied. - CF

## 2014-08-25 NOTE — Progress Notes (Signed)
  Subjective:     Amanda QuartoMelissa P Colton is a 44 y.o. female and is here for a comprehensive physical exam. The patient reports no problems.  History   Social History  . Marital Status: Divorced    Spouse Name: N/A  . Number of Children: N/A  . Years of Education: N/A   Occupational History  . Not on file.   Social History Main Topics  . Smoking status: Never Smoker   . Smokeless tobacco: Never Used  . Alcohol Use: 0.5 oz/week    1 drink(s) per week     Comment: 2 glasses wine @ 2x/month  . Drug Use: No  . Sexual Activity: Not on file   Other Topics Concern  . Not on file   Social History Narrative   Health Maintenance  Topic Date Due  . OPHTHALMOLOGY EXAM  04/22/1979  . HIV Screening  04/21/1984  . FOOT EXAM  03/29/2014  . URINE MICROALBUMIN  03/29/2014  . HEMOGLOBIN A1C  07/27/2014  . INFLUENZA VACCINE  01/02/2015  . PAP SMEAR  03/23/2015  . MAMMOGRAM  06/07/2015  . TETANUS/TDAP  05/22/2017  . PNEUMOCOCCAL POLYSACCHARIDE VACCINE (2) 03/29/2018    The following portions of the patient's history were reviewed and updated as appropriate: allergies, current medications, past family history, past medical history, past social history, past surgical history and problem list.  Review of Systems A comprehensive review of systems was negative.   Objective:    BP 129/79 mmHg  Pulse 73  Wt 255 lb (115.667 kg)  SpO2 97% General appearance: alert, cooperative, appears stated age, moderately obese and morbidly obese Head: Normocephalic, without obvious abnormality, atraumatic Eyes: conjunctivae/corneas clear. PERRL, EOM's intact. Fundi benign. Ears: normal TM's and external ear canals both ears Nose: Nares normal. Septum midline. Mucosa normal. No drainage or sinus tenderness. Throat: lips, mucosa, and tongue normal; teeth and gums normal Neck: no adenopathy, no carotid bruit, no JVD, supple, symmetrical, trachea midline and thyroid not enlarged, symmetric, no  tenderness/mass/nodules Back: symmetric, no curvature. ROM normal. No CVA tenderness. Lungs: clear to auscultation bilaterally Heart: regular rate and rhythm, S1, S2 normal, no murmur, click, rub or gallop Abdomen: soft, non-tender; bowel sounds normal; no masses,  no organomegaly Extremities: extremities normal, atraumatic, no cyanosis or edema Pulses: 2+ and symmetric Skin: Skin color, texture, turgor normal. No rashes or lesions Lymph nodes: Cervical, supraclavicular, and axillary nodes normal. Neurologic: Grossly normal    Assessment:    Healthy female exam.      Plan:    CPE- recently saw GYN, pap and mammo up to date. Discussed vitamin D and calcium 800 units and 1200 calcium.   Obesity- had gastric sleeve. Working with nutiritionsist to continue to loose weight. She is exercising regularly and managing diet but the weight seems to not be coming off.   Insulin resistance- A!C ordered. Encouraged eye exam.   Hypothyroidism- ordered TSH will adjust accordingly.   Asthma- singulair refilled for 1 year.   Migraine- refilled imitrex for rescue. Discussed if having more than 1-2 a week may need to discuss preventative.    See After Visit Summary for Counseling Recommendations

## 2014-08-25 NOTE — Telephone Encounter (Signed)
Contacted Pt and advised her insurance will only pay for a quantity of #9 on her Imitrex Rx. Pt verbalized understanding stating this was fine. I contact [facility] and changed quantity to #9 for all future refills and the refill for this month will be for #6 since she got #3 on 08/08/14. Pt verbalized understanding. No further questions.

## 2014-08-26 LAB — COMPLETE METABOLIC PANEL WITH GFR
ALT: 23 U/L (ref 0–35)
AST: 19 U/L (ref 0–37)
Albumin: 3.7 g/dL (ref 3.5–5.2)
Alkaline Phosphatase: 66 U/L (ref 39–117)
BUN: 14 mg/dL (ref 6–23)
CO2: 25 mEq/L (ref 19–32)
CREATININE: 0.71 mg/dL (ref 0.50–1.10)
Calcium: 9.2 mg/dL (ref 8.4–10.5)
Chloride: 103 mEq/L (ref 96–112)
GFR, Est African American: >89 mL/min
GFR, Est Non African American: >89 mL/min
Glucose, Bld: 80 mg/dL (ref 70–99)
POTASSIUM: 4.7 meq/L (ref 3.5–5.3)
Sodium: 138 mEq/L (ref 135–145)
Total Bilirubin: 0.5 mg/dL (ref 0.2–1.2)
Total Protein: 6.4 g/dL (ref 6.0–8.3)

## 2014-08-26 LAB — CBC WITH DIFFERENTIAL/PLATELET
Basophils Absolute: 0 10*3/uL (ref 0.0–0.1)
Basophils Relative: 0 % (ref 0–1)
EOS ABS: 0.2 10*3/uL (ref 0.0–0.7)
Eosinophils Relative: 2 % (ref 0–5)
HCT: 42.1 % (ref 36.0–46.0)
Hemoglobin: 14 g/dL (ref 12.0–15.0)
LYMPHS ABS: 2.1 10*3/uL (ref 0.7–4.0)
Lymphocytes Relative: 27 % (ref 12–46)
MCH: 29.9 pg (ref 26.0–34.0)
MCHC: 33.3 g/dL (ref 30.0–36.0)
MCV: 89.8 fL (ref 78.0–100.0)
MONOS PCT: 7 % (ref 3–12)
MPV: 10.8 fL (ref 8.6–12.4)
Monocytes Absolute: 0.6 10*3/uL (ref 0.1–1.0)
NEUTROS ABS: 5.1 10*3/uL (ref 1.7–7.7)
Neutrophils Relative %: 64 % (ref 43–77)
Platelets: 295 10*3/uL (ref 150–400)
RBC: 4.69 MIL/uL (ref 3.87–5.11)
RDW: 14.2 % (ref 11.5–15.5)
WBC: 7.9 10*3/uL (ref 4.0–10.5)

## 2014-08-26 LAB — LIPID PANEL
CHOLESTEROL: 166 mg/dL (ref 0–200)
HDL: 53 mg/dL (ref >=46)
LDL CALC: 92 mg/dL (ref 0–99)
TRIGLYCERIDES: 103 mg/dL (ref <150)
Total CHOL/HDL Ratio: 3.1 Ratio
VLDL: 21 mg/dL (ref 0–40)

## 2014-08-26 LAB — HEMOGLOBIN A1C
Hgb A1c MFr Bld: 5.7 % — ABNORMAL HIGH (ref <5.7)
MEAN PLASMA GLUCOSE: 117 mg/dL — AB (ref <117)

## 2014-08-26 LAB — VITAMIN B12: Vitamin B-12: 971 pg/mL — ABNORMAL HIGH (ref 211–911)

## 2014-08-26 LAB — TSH: TSH: 1.227 u[IU]/mL (ref 0.350–4.500)

## 2014-08-26 LAB — FERRITIN: FERRITIN: 95 ng/mL (ref 10–291)

## 2014-08-27 LAB — VITAMIN D 25 HYDROXY (VIT D DEFICIENCY, FRACTURES): VIT D 25 HYDROXY: 24 ng/mL — AB (ref 30–100)

## 2014-08-30 ENCOUNTER — Other Ambulatory Visit: Payer: Self-pay | Admitting: *Deleted

## 2014-08-30 MED ORDER — LEVOTHYROXINE SODIUM 137 MCG PO TABS: 137.0000 ug | ORAL_TABLET | Freq: Every day | ORAL | Status: DC

## 2014-09-03 ENCOUNTER — Other Ambulatory Visit: Payer: Self-pay | Admitting: Physician Assistant

## 2014-09-13 ENCOUNTER — Emergency Department (INDEPENDENT_AMBULATORY_CARE_PROVIDER_SITE_OTHER)
Admission: EM | Admit: 2014-09-13 | Discharge: 2014-09-13 | Disposition: A | Discharge status: Home / Self Care | Payer: BLUE CROSS/BLUE SHIELD | Source: Home / Self Care

## 2014-09-13 ENCOUNTER — Encounter: Payer: Self-pay | Admitting: *Deleted

## 2014-09-13 DIAGNOSIS — J039 Acute tonsillitis, unspecified: Secondary | ICD-10-CM | POA: Diagnosis not present

## 2014-09-13 LAB — POCT RAPID STREP A (OFFICE): Rapid Strep A Screen: NEGATIVE

## 2014-09-13 MED ORDER — AMOXICILLIN-POT CLAVULANATE 875-125 MG PO TABS: 1.0000 | ORAL_TABLET | Freq: Two times a day (BID) | ORAL | Status: DC

## 2014-09-13 MED ORDER — CEFTRIAXONE SODIUM 1 G IJ SOLR
1.0000 g | INTRAMUSCULAR | Status: AC
  Administered 2014-09-13: 1 g via INTRAMUSCULAR

## 2014-09-13 NOTE — ED Notes (Signed)
Pt c/o sore throat ad swollen tonsils x 1 day. Denies fever.

## 2014-09-13 NOTE — ED Provider Notes (Signed)
CSN: [id number]     Arrival date & time 09/13/14  1703 History   None    Chief Complaint  Patient presents with  . Sore Throat   (Consider location/radiation/quality/duration/timing/severity/associated sxs/prior Treatment) HPI SORE THROAT Onset: 2 days    Severity: moderate Tried OTC meds without significant relief.  Symptoms:  + Fever  + Swollen neck glands No Recent Strep Exposure     No Myalgias No Headache No Rash  No Discolored Nasal Mucus No Allergy symptoms No sinus pain/pressure No itchy/red eyes No earache  No Drooling No Trismus  No Nausea No Vomiting No Abdominal pain No Diarrhea No Reflux symptoms  No Cough No Breathing Difficulty No Shortness of Breath No pleuritic pain No Wheezing No Hemoptysis  Remainder of Review of Systems negative for acute change except as noted in the HPI.  Past Medical History  Diagnosis Date  . Hypertension   . Obesity   . Exercise-induced asthma   . Diabetes mellitus without complication   . Complication of anesthesia     NAUSEA  . Post-nasal drip   . Headache(784.0)     MIGRAINES  . Hypothyroidism   . Sleep apnea     STOPPPED USING C-PAP 6 MO AGO   Past Surgical History  Procedure Laterality Date  . Hernia repair    . Laparoscopic gastric sleeve resection N/A 05/14/2013    Procedure: LAPAROSCOPIC GASTRIC SLEEVE RESECTION;  Surgeon: Pedro Earls, MD;  Location: [facility];  Service: General;  Laterality: N/A;  . Upper gi endoscopy  05/14/2013    Procedure: UPPER GI ENDOSCOPY;  Surgeon: Pedro Earls, MD;  Location: [facility];  Service: General;;  . Umbilical hernia repair  05/14/2013    Procedure: HERNIA REPAIR UMBILICAL ADULT;  Surgeon: Pedro Earls, MD;  Location: [facility];  Service: General;;   Family History  Problem Relation Age of Onset  . Hypertension Mother   . Hypertension Father   . Diabetes Father   . Heart disease Maternal Grandfather   . Heart attack Maternal Grandfather   . Cancer  Maternal Grandfather     bladder  . Hyperlipidemia Other   . Sleep apnea Other    History  Substance Use Topics  . Smoking status: Never Smoker   . Smokeless tobacco: Never Used  . Alcohol Use: 0.5 oz/week    1 drink(s) per week     Comment: 2 glasses wine @ 2x/month   OB History    No data available     Review of Systems  Allergies  Review of patient's allergies indicates no known allergies.  Home Medications   Prior to Admission medications   Medication Sig Start Date End Date Taking? Authorizing Provider  albuterol (PROVENTIL HFA;VENTOLIN HFA) 108 (90 BASE) MCG/ACT inhaler Inhale 2 puffs into the lungs every 6 (six) hours as needed for wheezing. 06/12/12   Jade L Breeback, PA-C  amoxicillin-clavulanate (AUGMENTIN) 875-125 MG per tablet Take 1 tablet by mouth 2 (two) times daily. For 10 days. Take with food. 09/13/14   Jacqulyn Cane, MD  Blood Glucose Monitoring Suppl (ONE TOUCH ULTRA MINI) W/DEVICE KIT  05/26/13   Historical Provider, MD  Lancet Devices ([place] LANCING DEV) [place]  05/26/13   Historical Provider, MD  levothyroxine (SYNTHROID, LEVOTHROID) 137 MCG tablet Take 1 tablet (137 mcg total) by mouth daily before breakfast. 08/30/14   Jade L Breeback, PA-C  montelukast (SINGULAIR) 10 MG tablet TAKE 1 TABLET (10 MG TOTAL) BY MOUTH AT BEDTIME.  08/22/14   Jade L Breeback, PA-C  norethindrone-ethinyl estradiol (JUNEL FE,GILDESS FE,LOESTRIN FE) 1-20 MG-MCG tablet Take 1 tablet by mouth daily.    Historical Provider, MD  ONE TOUCH ULTRA TEST test strip  05/26/13   Historical Provider, MD  SUMAtriptan (IMITREX) 100 MG tablet TAKE ONE TABLET BY MOUTH EVERY TWO HOURS AS NEEDED 08/22/14   Jade L Breeback, PA-C   BP 138/93 mmHg  Pulse 85  Temp(Src) 97.9 F (36.6 C) (Oral)  Resp 16  Ht 5' 6"  (1.676 m)  Wt 253 lb (114.76 kg)  BMI 40.85 kg/m2  SpO2 99% Physical Exam  Constitutional: She is oriented to person, place, and time. She appears well-developed and well-nourished.   Non-toxic appearance. She appears ill. No distress.  HENT:  Head: Normocephalic and atraumatic.  Right Ear: Tympanic membrane, external ear and ear canal normal.  Left Ear: Tympanic membrane, external ear and ear canal normal.  Nose: Nose normal. Right sinus exhibits no maxillary sinus tenderness and no frontal sinus tenderness. Left sinus exhibits no maxillary sinus tenderness and no frontal sinus tenderness.  Mouth/Throat: Uvula is midline and mucous membranes are normal. No oral lesions. Oropharyngeal exudate and posterior oropharyngeal erythema present. No tonsillar abscesses.  Posterior pharynx: 4+ tonsillar enlargement and redness with whitish exudate.  Airway intact.  Eyes: Conjunctivae are normal. No scleral icterus.  Neck: Neck supple.  Cardiovascular: Normal rate, regular rhythm and normal heart sounds.   No murmur heard. Pulmonary/Chest: Effort normal and breath sounds normal. No stridor. No respiratory distress. She has no wheezes. She has no rhonchi. She has no rales.  Abdominal: Soft. She exhibits no mass. There is no hepatosplenomegaly. There is no tenderness.  Lymphadenopathy:    She has cervical adenopathy.       Right cervical: Superficial cervical adenopathy present. No deep cervical and no posterior cervical adenopathy present.      Left cervical: Superficial cervical adenopathy present. No deep cervical and no posterior cervical adenopathy present.  Neurological: She is alert and oriented to person, place, and time.  Skin: Skin is warm. No rash noted.  Psychiatric: She has a normal mood and affect.  Nursing note and vitals reviewed.   ED Course  Procedures (including critical care time) Labs Review Labs Reviewed  STREP A DNA PROBE  POCT RAPID STREP A (OFFICE)    Imaging Review No results found.   MDM   1. Exudative tonsillitis    Rapid strep test negative No evidence of airway compromise. Treatment options discussed, as well as risks, benefits,  alternatives. Patient voiced understanding and agreement with the following plans: Rocephin 1 g IM stat Send a strep culture Discharge Medication List as of 09/13/2014  6:12 PM    START taking these medications   Details  amoxicillin-clavulanate (AUGMENTIN) 875-125 MG per tablet Take 1 tablet by mouth 2 (two) times daily. For 10 days. Take with food., Starting 09/13/2014, Until Discontinued, Normal       Other symptomatic care discussed. Follow-up with your ENT or primary care doctor in 2-3 days, or sooner if symptoms become worse. Precautions discussed. Red flags discussed.--Go to emergency room if any red flag Questions invited and answered. Patient voiced understanding and agreement.     Jacqulyn Cane, MD 09/13/14 [phone]

## 2014-09-14 LAB — STREP A DNA PROBE: GASP: NEGATIVE

## 2014-10-18 ENCOUNTER — Ambulatory Visit: Payer: BLUE CROSS/BLUE SHIELD | Admitting: Dietician

## 2014-11-28 ENCOUNTER — Other Ambulatory Visit: Payer: Self-pay

## 2015-02-03 ENCOUNTER — Other Ambulatory Visit: Payer: Self-pay | Admitting: *Deleted

## 2015-02-03 MED ORDER — GLUCOSE BLOOD VI STRP: ORAL_STRIP | Status: DC

## 2015-03-19 IMAGING — RF DG UGI W/ KUB
8 series · 8 of 8 positions shown · non-contrast
Comparison: None.

FLUOROSCOPY TIME:  36 seconds

CLINICAL DATA: Pre bariatric upper GI

EXAM:
UPPER GI SERIES W/ KUB
TECHNIQUE: After obtaining a scout radiograph a routine upper GI series was
performed using thin density barium single-contrast technique.

[Series 1: run · 1 of 1 slices shown (1 of 6)]
[im 1/1]
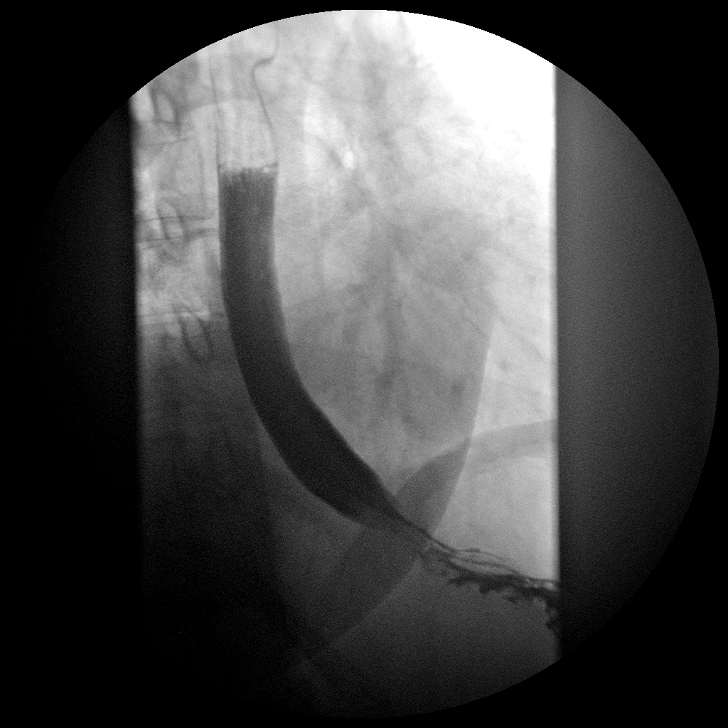

[Series 2: run · 1 of 1 slices shown (2 of 6)]
[im 1/1]
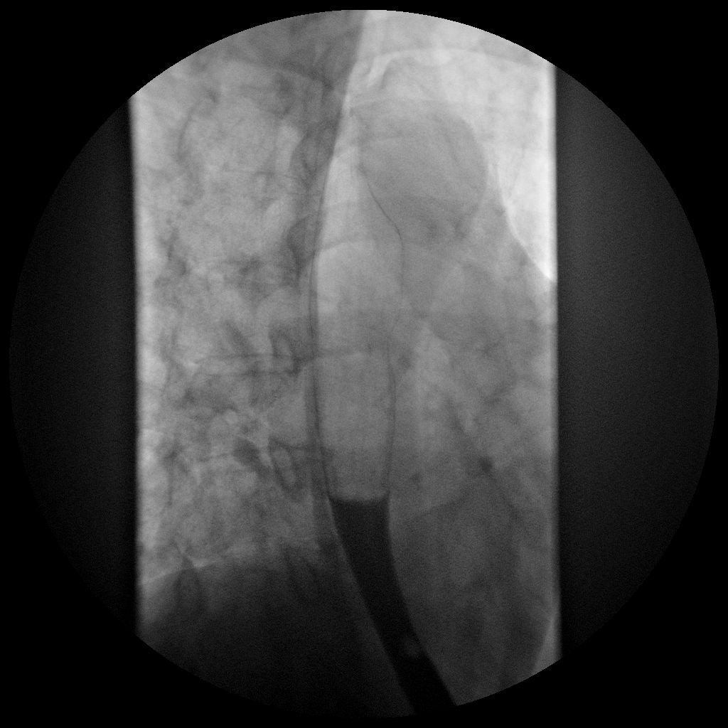

[Series 3: run · 1 of 1 slices shown (3 of 6)]
[im 1/1]
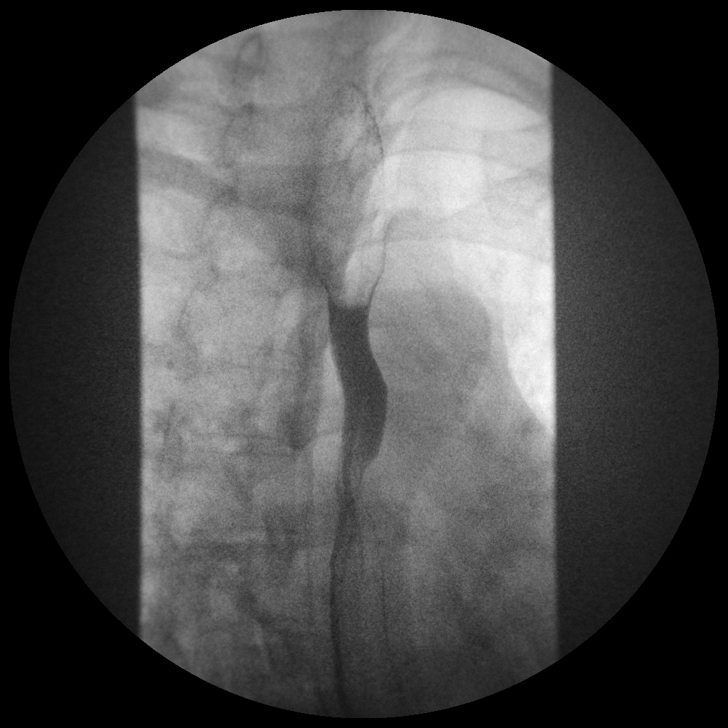

[Series 4: run · 1 of 1 slices shown (4 of 6)]
[im 1/1]
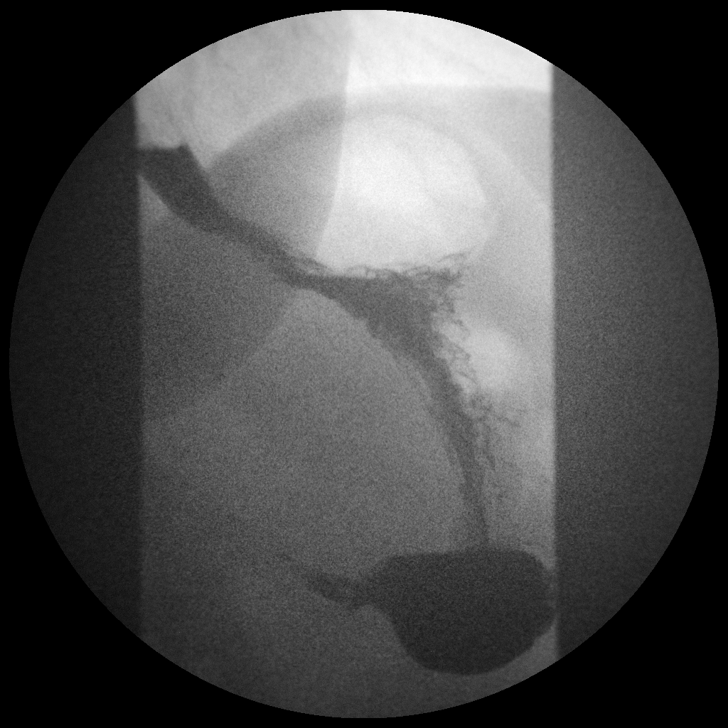

[Series 5: run · 1 of 1 slices shown (5 of 6)]
[im 1/1]
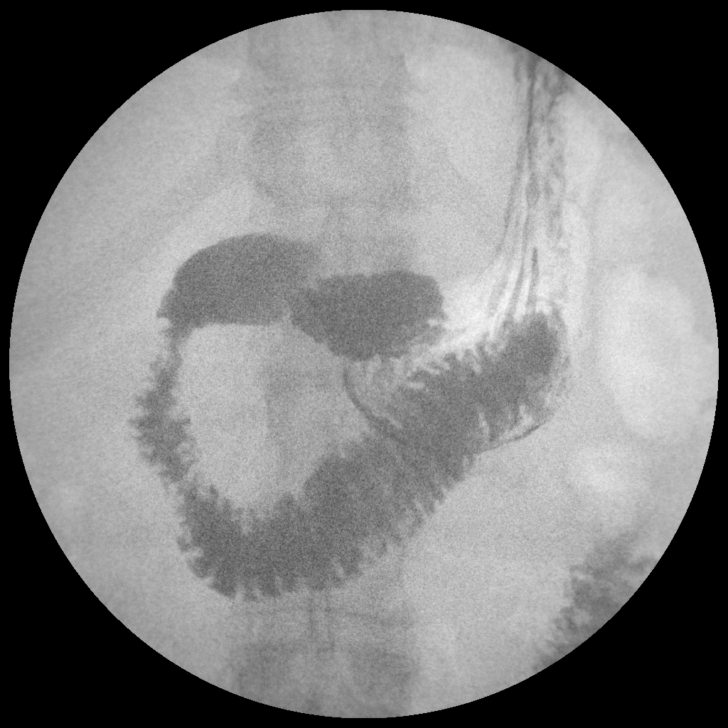

[Series 6: run · 1 of 1 slices shown (6 of 6)]
[im 1/1]
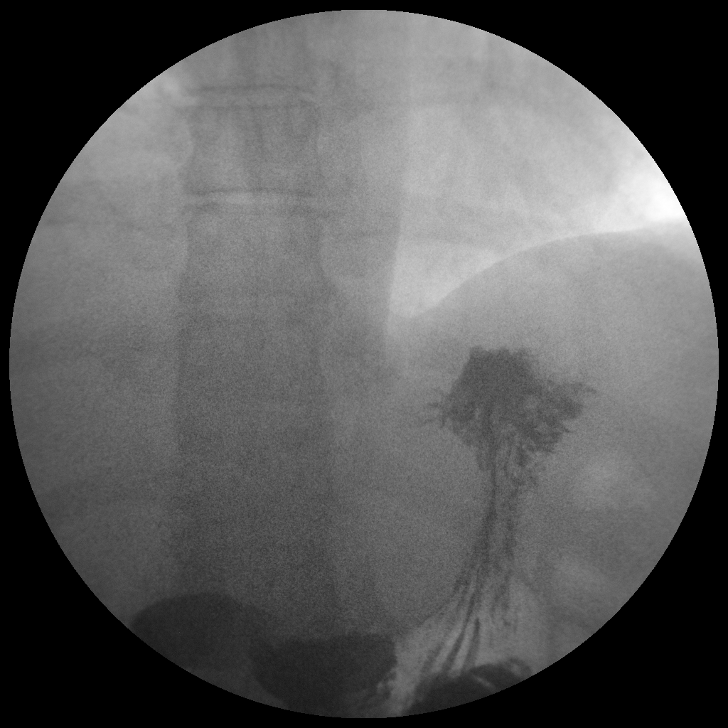

[Series 1001: view not recorded · 0.20mm/px · 1 of 1 slices shown (1 of 2)]
[im 1/1]
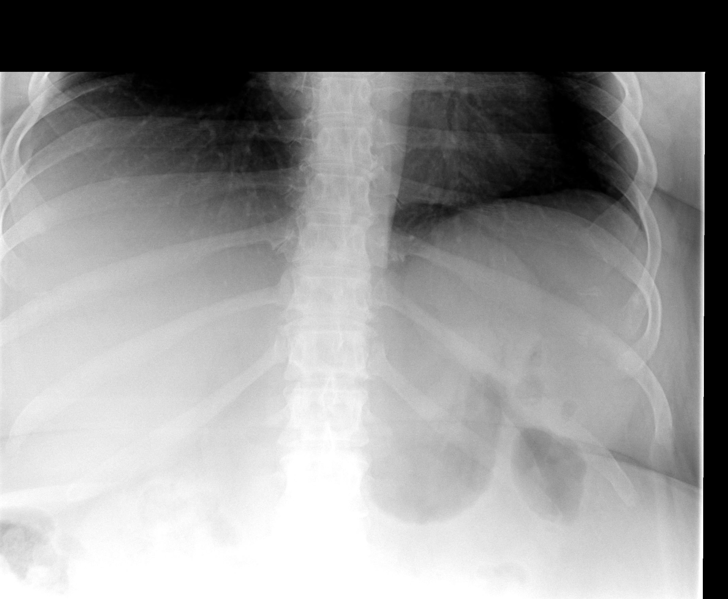

[Series 1002: view not recorded · 0.20mm/px · 1 of 1 slices shown (2 of 2)]
[im 1/1]
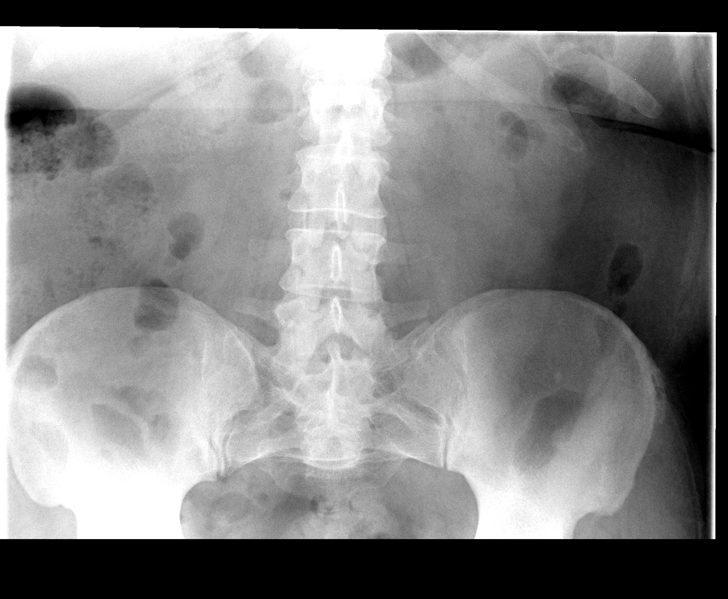

[8 of 8 positions shown; findings below may reference images not displayed]

FINDINGS: The esophagus is normal in course and caliber without extrinsic mass
effect or intrinsic filling defect. The stomach is normal in caliber
and appearance. Ligament of [place] is properly located. Esophageal
motility is within normal limits. No reflux was observed.
IMPRESSION: Normal single contrast upper GI exam.

## 2015-03-23 ENCOUNTER — Other Ambulatory Visit: Payer: Self-pay | Admitting: Physician Assistant

## 2015-04-12 ENCOUNTER — Other Ambulatory Visit: Payer: Self-pay | Admitting: Physician Assistant

## 2015-06-08 ENCOUNTER — Other Ambulatory Visit: Payer: Self-pay | Admitting: Physician Assistant

## 2015-06-09 ENCOUNTER — Other Ambulatory Visit: Payer: Self-pay | Admitting: *Deleted

## 2015-06-09 MED ORDER — LEVOTHYROXINE SODIUM 137 MCG PO TABS: ORAL_TABLET | ORAL | Status: DC

## 2015-06-13 ENCOUNTER — Other Ambulatory Visit: Payer: Self-pay | Admitting: Physician Assistant

## 2015-06-20 ENCOUNTER — Ambulatory Visit: Payer: BLUE CROSS/BLUE SHIELD | Admitting: Physician Assistant

## 2015-06-23 ENCOUNTER — Ambulatory Visit: Payer: BLUE CROSS/BLUE SHIELD | Admitting: Physician Assistant

## 2015-07-03 ENCOUNTER — Encounter: Payer: Self-pay | Admitting: Physician Assistant

## 2015-07-03 ENCOUNTER — Ambulatory Visit (INDEPENDENT_AMBULATORY_CARE_PROVIDER_SITE_OTHER): Payer: BLUE CROSS/BLUE SHIELD | Admitting: Physician Assistant

## 2015-07-03 VITALS — BP 144/78 | HR 74 | Ht 66.0 in | Wt 273.0 lb

## 2015-07-03 DIAGNOSIS — R61 Generalized hyperhidrosis: Secondary | ICD-10-CM

## 2015-07-03 DIAGNOSIS — F418 Other specified anxiety disorders: Secondary | ICD-10-CM

## 2015-07-03 DIAGNOSIS — E119 Type 2 diabetes mellitus without complications: Secondary | ICD-10-CM

## 2015-07-03 DIAGNOSIS — R5383 Other fatigue: Secondary | ICD-10-CM | POA: Diagnosis not present

## 2015-07-03 DIAGNOSIS — H538 Other visual disturbances: Secondary | ICD-10-CM

## 2015-07-03 DIAGNOSIS — I10 Essential (primary) hypertension: Secondary | ICD-10-CM

## 2015-07-03 DIAGNOSIS — E559 Vitamin D deficiency, unspecified: Secondary | ICD-10-CM

## 2015-07-03 DIAGNOSIS — G4730 Sleep apnea, unspecified: Secondary | ICD-10-CM

## 2015-07-03 DIAGNOSIS — E039 Hypothyroidism, unspecified: Secondary | ICD-10-CM

## 2015-07-03 DIAGNOSIS — R635 Abnormal weight gain: Secondary | ICD-10-CM | POA: Diagnosis not present

## 2015-07-03 LAB — CBC WITH DIFFERENTIAL/PLATELET
BASOS PCT: 0 % (ref 0–1)
Basophils Absolute: 0 10*3/uL (ref 0.0–0.1)
EOS ABS: 0.1 10*3/uL (ref 0.0–0.7)
EOS PCT: 1 % (ref 0–5)
HCT: 41.4 % (ref 36.0–46.0)
Hemoglobin: 14.1 g/dL (ref 12.0–15.0)
Lymphocytes Relative: 24 % (ref 12–46)
Lymphs Abs: 2.1 10*3/uL (ref 0.7–4.0)
MCH: 29.7 pg (ref 26.0–34.0)
MCHC: 34.1 g/dL (ref 30.0–36.0)
MCV: 87.2 fL (ref 78.0–100.0)
MONOS PCT: 7 % (ref 3–12)
MPV: 10.9 fL (ref 8.6–12.4)
Monocytes Absolute: 0.6 10*3/uL (ref 0.1–1.0)
Neutro Abs: 5.9 10*3/uL (ref 1.7–7.7)
Neutrophils Relative %: 68 % (ref 43–77)
PLATELETS: 349 10*3/uL (ref 150–400)
RBC: 4.75 MIL/uL (ref 3.87–5.11)
RDW: 13.5 % (ref 11.5–15.5)
WBC: 8.7 10*3/uL (ref 4.0–10.5)

## 2015-07-03 LAB — COMPREHENSIVE METABOLIC PANEL
ALBUMIN: 3.8 g/dL (ref 3.6–5.1)
ALT: 21 U/L (ref 6–29)
AST: 17 U/L (ref 10–35)
Alkaline Phosphatase: 66 U/L (ref 33–115)
BUN: 12 mg/dL (ref 7–25)
CO2: 24 mmol/L (ref 20–31)
CREATININE: 0.66 mg/dL (ref 0.50–1.10)
Calcium: 9.4 mg/dL (ref 8.6–10.2)
Chloride: 101 mmol/L (ref 98–110)
Glucose, Bld: 92 mg/dL (ref 65–99)
Potassium: 4.3 mmol/L (ref 3.5–5.3)
SODIUM: 135 mmol/L (ref 135–146)
Total Bilirubin: 0.7 mg/dL (ref 0.2–1.2)
Total Protein: 6.5 g/dL (ref 6.1–8.1)

## 2015-07-03 LAB — CBC
HCT: 41.4 % (ref 36.0–46.0)
HEMOGLOBIN: 14.1 g/dL (ref 12.0–15.0)
MCH: 29.7 pg (ref 26.0–34.0)
MCHC: 34.1 g/dL (ref 30.0–36.0)
MCV: 87.2 fL (ref 78.0–100.0)
MPV: 10.9 fL (ref 8.6–12.4)
PLATELETS: 349 10*3/uL (ref 150–400)
RBC: 4.75 MIL/uL (ref 3.87–5.11)
RDW: 13.5 % (ref 11.5–15.5)
WBC: 8.7 10*3/uL (ref 4.0–10.5)

## 2015-07-03 LAB — HEMOGLOBIN A1C
HEMOGLOBIN A1C: 5.8 % — AB (ref <5.7)
MEAN PLASMA GLUCOSE: 120 mg/dL — AB (ref <117)

## 2015-07-03 LAB — POCT UA - MICROALBUMIN
Albumin/Creatinine Ratio, Urine, POC: <30
Creatinine, POC: 50 mg/dL
Microalbumin Ur, POC: 10 mg/L

## 2015-07-03 LAB — C-REACTIVE PROTEIN: CRP: 0.6 mg/dL — ABNORMAL HIGH (ref <0.60)

## 2015-07-03 LAB — T3, FREE: T3, Free: 2.5 pg/mL (ref 2.3–4.2)

## 2015-07-03 LAB — SEDIMENTATION RATE: Sed Rate: 11 mm/hr (ref 0–20)

## 2015-07-03 LAB — TSH: TSH: 1.401 u[IU]/mL (ref 0.350–4.500)

## 2015-07-03 LAB — FERRITIN: Ferritin: 147 ng/mL (ref 10–291)

## 2015-07-03 LAB — POCT GLYCOSYLATED HEMOGLOBIN (HGB A1C): Hemoglobin A1C: 5.5

## 2015-07-03 LAB — VITAMIN B12: Vitamin B-12: 658 pg/mL (ref 211–911)

## 2015-07-03 LAB — T4, FREE: FREE T4: 1.31 ng/dL (ref 0.80–1.80)

## 2015-07-03 MED ORDER — AMBULATORY NON FORMULARY MEDICATION: Status: DC

## 2015-07-03 MED ORDER — LIRAGLUTIDE -WEIGHT MANAGEMENT 18 MG/3ML ~~LOC~~ SOPN: 3.0000 mg | PEN_INJECTOR | Freq: Every day | SUBCUTANEOUS | Status: DC

## 2015-07-03 NOTE — Patient Instructions (Signed)
Liraglutide injection (Weight Management) What is this medicine? LIRAGLUTIDE (LIR a GLOO tide) is used with a reduced calorie diet and exercise to help you lose weight. This medicine may be used for other purposes; ask your health care provider or pharmacist if you have questions. What should I tell my health care provider before I take this medicine? They need to know if you have any of these conditions: -endocrine tumors (MEN 2) or if someone in your family had these tumors -gallstones -high cholesterol -history of alcohol abuse problem -history of pancreatitis -kidney disease or if you are on dialysis -liver disease -previous swelling of the tongue, face, or lips with difficulty breathing, difficulty swallowing, hoarseness, or tightening of the throat -stomach problems -suicidal thoughts, plans, or attempt; a previous suicide attempt by you or a family member -thyroid cancer or if someone in your family had thyroid cancer -an unusual or allergic reaction to liraglutide, medicines, foods, dyes, or preservatives -pregnant or trying to get pregnant -breast-feeding How should I use this medicine? This medicine is for injection under the skin of your upper leg, stomach area, or upper arm. You will be taught how to prepare and give this medicine. Use exactly as directed. Take your medicine at regular intervals. Do not take it more often than directed. It is important that you put your used needles and syringes in a special sharps container. Do not put them in a trash can. If you do not have a sharps container, call your pharmacist or healthcare provider to get one. A special MedGuide will be given to you by the pharmacist with each prescription and refill. Be sure to read this information carefully each time. Talk to your pediatrician regarding the use of this medicine in children. Special care may be needed. Overdosage: If you think you have taken too much of this medicine contact a poison  control center or emergency room at once. NOTE: This medicine is only for you. Do not share this medicine with others. What if I miss a dose? If you miss a dose, take it as soon as you can. If it is almost time for your next dose, take only that dose. Do not take double or extra doses. If you miss your dose for 3 days or more, call your doctor or health care professional to talk about how to restart this medicine. What may interact with this medicine? -acetaminophen -atorvastatin -birth control pills -digoxin -griseofulvin -lisinopril This list may not describe all possible interactions. Give your health care provider a list of all the medicines, herbs, non-prescription drugs, or dietary supplements you use. Also tell them if you smoke, drink alcohol, or use illegal drugs. Some items may interact with your medicine. What should I watch for while using this medicine? Visit your doctor or health care professional for regular checks on your progress. This medicine is intended to be used in addition to a healthy diet and appropriate exercise. The best results are achieved this way. Do not increase or in any way change your dose without consulting your doctor or health care professional. This medicine may affect blood sugar levels. If you have diabetes, check with your doctor or health care professional before you change your diet or the dose of your diabetic medicine. Patients and their families should watch out for worsening depression or thoughts of suicide. Also watch out for sudden changes in feelings such as feeling anxious, agitated, panicky, irritable, hostile, aggressive, impulsive, severely restless, overly excited and hyperactive, or not being   able to sleep. If this happens, especially at the beginning of treatment or after a change in dose, call your health care professional. What side effects may I notice from receiving this medicine? Side effects that you should report to your doctor or  health care professional as soon as possible: -allergic reactions like skin rash, itching or hives, swelling of the face, lips, or tongue -breathing problems -fever, chills -loss of appetite -signs and symptoms of low blood sugar such as feeling anxious, confusion, dizziness, increased hunger, unusually weak or tired, sweating, shakiness, cold, irritable, headache, blurred vision, fast heartbeat, loss of consciousness -trouble passing urine or change in the amount of urine -unusual stomach pain or upset -vomiting Side effects that usually do not require medical attention (Report these to your doctor or health care professional if they continue or are bothersome.): -constipation -diarrhea -fatigue -headache -nausea This list may not describe all possible side effects. Call your doctor for medical advice about side effects. You may report side effects to FDA at 1-800-FDA-1088. Where should I keep my medicine? Keep out of the reach of children. Store unopened pen in a refrigerator between 2 and 8 degrees C (36 and 46 degrees F). Do not freeze or use if the medicine has been frozen. Protect from light and excessive heat. After you first use the pen, it can be stored at room temperature between 15 and 30 degrees C (59 and 86 degrees F) or in a refrigerator. Throw away your used pen after 30 days or after the expiration date, whichever comes first. Do not store your pen with the needle attached. If the needle is left on, medicine may leak from the pen. NOTE: This sheet is a summary. It may not cover all possible information. If you have questions about this medicine, talk to your doctor, pharmacist, or health care provider.    2016, [employer]. (2013-07-15 12:29:49)  

## 2015-07-03 NOTE — Progress Notes (Signed)
   Subjective:    Patient ID: Amanda Davidson, female    DOB: 06-06-1970, 45 y.o.   MRN: [id number]  HPI  Pt presents to the clinic for DM follow up and to get meds refilled.   DM- she feels like her sugars might be off. In the last month her fasting are 130's which is higher than they usally are. She does not check any other time. She has had some off and on vision blurriness. She has not see eye doctor in last year. She has gained 20lbs since 09/2014. She had gastric sleeve in 2014. She continues to be on thyroid medication. She just doesn't feel good. She has no energy or motivation. She wants to lose weight. No opens sores or wounds.    Review of Systems  All other systems reviewed and are negative.      Objective:   Physical Exam  Constitutional: She is oriented to person, place, and time. She appears well-developed and well-nourished.  Obese.   HENT:  Head: Normocephalic and atraumatic.  Eyes: Conjunctivae are normal. Right eye exhibits no discharge. Left eye exhibits no discharge.  Neck: Normal range of motion. Neck supple. No thyromegaly present.  Cardiovascular: Normal rate, regular rhythm and normal heart sounds.   Pulmonary/Chest: Effort normal and breath sounds normal.  Neurological: She is alert and oriented to person, place, and time.  Psychiatric: She has a normal mood and affect. Her behavior is normal.          Assessment & Plan:  DM type II/insulin resistance- A1C is 5.5 today. Will confirm with blood due to patient having higher readings than normal.  Great numbers today. Out of DM range.  mirco UA normal.  Foot exam normal.  Discussed eye exam.   Blurred vision- needs eye exam could be a prescription change.   Obese- will try saxenda. Discussed how to tirate up to from .  to . Coupon card given. rx and needles sent to pharmacy. Follow up in 2 months. Discussed side effects.   Hypothyroidism- will check today and adjust accordingly.   Night  sweats- will check FSH. This could represents some anxiety. She is not currently on any medication. She declines going on any.   Depression/anxiety- hx of this. Not currently on medication. Discussed this could help her feel better. She declines today. Encouraged exercise and counseling.   Fatigue- will check labs. Hx of vitamin D def(and not on vitamin D), sleep apnea(not on CPAP). Encouraged to get back on both. Will call with results. Hopefully losing some weight will give her energy as well.

## 2015-07-04 LAB — FOLLICLE STIMULATING HORMONE: FSH: 7.2 m[IU]/mL

## 2015-07-04 LAB — VITAMIN D 25 HYDROXY (VIT D DEFICIENCY, FRACTURES): Vit D, 25-Hydroxy: 24 ng/mL — ABNORMAL LOW (ref 30–100)

## 2015-07-05 ENCOUNTER — Encounter: Payer: Self-pay | Admitting: Physician Assistant

## 2015-07-16 ENCOUNTER — Other Ambulatory Visit: Payer: Self-pay | Admitting: Physician Assistant

## 2015-08-14 LAB — HM DIABETES EYE EXAM

## 2015-08-29 ENCOUNTER — Ambulatory Visit: Payer: BLUE CROSS/BLUE SHIELD | Admitting: Physician Assistant

## 2015-09-14 ENCOUNTER — Other Ambulatory Visit: Payer: Self-pay | Admitting: Physician Assistant

## 2015-09-14 ENCOUNTER — Encounter: Payer: Self-pay | Admitting: Physician Assistant

## 2015-10-16 ENCOUNTER — Other Ambulatory Visit: Payer: Self-pay | Admitting: *Deleted

## 2015-10-16 MED ORDER — LEVOTHYROXINE SODIUM 137 MCG PO TABS: ORAL_TABLET | ORAL | Status: DC

## 2015-11-06 ENCOUNTER — Other Ambulatory Visit: Payer: Self-pay | Admitting: Physician Assistant

## 2015-12-10 ENCOUNTER — Other Ambulatory Visit: Payer: Self-pay | Admitting: Physician Assistant

## 2016-03-05 DIAGNOSIS — Z1231 Encounter for screening mammogram for malignant neoplasm of breast: Secondary | ICD-10-CM | POA: Diagnosis not present

## 2016-03-05 LAB — HM MAMMOGRAPHY

## 2016-03-18 ENCOUNTER — Encounter: Payer: Self-pay | Admitting: Physician Assistant

## 2016-05-09 ENCOUNTER — Other Ambulatory Visit: Payer: Self-pay | Admitting: Physician Assistant

## 2016-05-28 DIAGNOSIS — J208 Acute bronchitis due to other specified organisms: Secondary | ICD-10-CM | POA: Diagnosis not present

## 2016-06-12 DIAGNOSIS — J209 Acute bronchitis, unspecified: Secondary | ICD-10-CM | POA: Diagnosis not present

## 2016-06-26 ENCOUNTER — Encounter (HOSPITAL_COMMUNITY): Payer: Self-pay

## 2016-07-02 ENCOUNTER — Ambulatory Visit (INDEPENDENT_AMBULATORY_CARE_PROVIDER_SITE_OTHER): Payer: BLUE CROSS/BLUE SHIELD | Admitting: Physician Assistant

## 2016-07-02 ENCOUNTER — Other Ambulatory Visit: Payer: Self-pay | Admitting: Physician Assistant

## 2016-07-02 ENCOUNTER — Encounter: Payer: Self-pay | Admitting: Physician Assistant

## 2016-07-02 VITALS — BP 155/80 | HR 106 | Ht 66.0 in | Wt 279.0 lb

## 2016-07-02 DIAGNOSIS — Z79899 Other long term (current) drug therapy: Secondary | ICD-10-CM

## 2016-07-02 DIAGNOSIS — I10 Essential (primary) hypertension: Secondary | ICD-10-CM | POA: Diagnosis not present

## 2016-07-02 DIAGNOSIS — E039 Hypothyroidism, unspecified: Secondary | ICD-10-CM | POA: Diagnosis not present

## 2016-07-02 DIAGNOSIS — F418 Other specified anxiety disorders: Secondary | ICD-10-CM

## 2016-07-02 DIAGNOSIS — E8881 Metabolic syndrome: Secondary | ICD-10-CM | POA: Diagnosis not present

## 2016-07-02 MED ORDER — CLONAZEPAM 0.5 MG PO TABS: 0.5000 mg | ORAL_TABLET | Freq: Two times a day (BID) | ORAL | 1 refills | Status: DC | PRN

## 2016-07-02 MED ORDER — MONTELUKAST SODIUM 10 MG PO TABS: ORAL_TABLET | ORAL | 4 refills | Status: DC

## 2016-07-02 MED ORDER — SUMATRIPTAN SUCCINATE 100 MG PO TABS: ORAL_TABLET | ORAL | 4 refills | Status: DC

## 2016-07-02 MED ORDER — ALBUTEROL SULFATE HFA 108 (90 BASE) MCG/ACT IN AERS: 2.0000 | INHALATION_SPRAY | Freq: Four times a day (QID) | RESPIRATORY_TRACT | 1 refills | Status: DC | PRN

## 2016-07-02 MED ORDER — SERTRALINE HCL 50 MG PO TABS: 50.0000 mg | ORAL_TABLET | Freq: Every day | ORAL | 1 refills | Status: DC

## 2016-07-02 NOTE — Progress Notes (Addendum)
Subjective:    Patient ID: Amanda Davidson, female    DOB: 06-Jul-1970, 46 y.o.   MRN: [id number]  HPI Patient is a 46yo female who presents to discuss her depression and anxiety.  She states she has been struggling with her daughter's diagnosis of anorexia, and she has been overly emotional and extremely tearful.  She states that people at work have been making comments about her "being on the verge of a mental breakdown," which she feels has made her feel worse. She states she would like to start an anti-depressant and something for emergency anxiety episodes.  Patient states she has been on zoloft and wellbutrin in the past.  She states she has been able to sleep ok.  She states she will be going to a parents group this weekend, and she has considered going to see a counselor through her work.  She states she would also like to start exercising again.    Patient reports she needs refills of her medications.  She would like a 90-supply.    Review of Systems  All other systems reviewed and are negative.      Objective:   Physical Exam  Constitutional: She is oriented to person, place, and time. She appears well-developed and well-nourished.  HENT:  Head: Normocephalic and atraumatic.  Cardiovascular: Normal rate, regular rhythm and normal heart sounds.   Pulmonary/Chest: Effort normal and breath sounds normal.  Neurological: She is alert and oriented to person, place, and time.  Skin: Skin is warm and dry.  Psychiatric: She has a normal mood and affect. Her behavior is normal.  Nursing note and vitals reviewed.         Assessment & Plan:  Marland KitchenMarland KitchenDiagnoses and all orders for this visit:  Hypertension, essential, benign  Hypothyroidism, unspecified type -     TSH  Insulin resistance -     Hemoglobin A1c -     COMPLETE METABOLIC PANEL WITH GFR  Depression with anxiety -     sertraline (ZOLOFT) 50 MG tablet; Take 1 tablet (50 mg total) by mouth daily. -     clonazePAM  (KLONOPIN) 0.5 MG tablet; Take 1 tablet (0.5 mg total) by mouth 2 (two) times daily as needed for anxiety. -     COMPLETE METABOLIC PANEL WITH GFR  Medication management -     COMPLETE METABOLIC PANEL WITH GFR  Other orders -     montelukast (SINGULAIR) 10 MG tablet; TAKE 1 TABLET (10 MG TOTAL) BY MOUTH AT BEDTIME. -     SUMAtriptan (IMITREX) 100 MG tablet; TAKE ONE TABLET BY MOUTH EVERY TWO HOURS AS NEEDED -     albuterol (PROAIR HFA) 108 (90 Base) MCG/ACT inhaler; Inhale 2 puffs into the lungs every 6 (six) hours as needed. for wheezing   Patient will be started on Zoloft 50mg .  Patient was instructed to start with 25mg  for the first week and go up to 50mg  the second week. Side effects explained.  Pt agreed to reach out for counseling through work and look into joining a parents support group for children with anorexia.   Patient was given Klonopin 0.5mg .   Instructed patient to use this PRN to avoid dependence.  Side effects explained.    Patient's proair, singular, and imitrex were refilled.  Her routine labs will be checked including hemoglobin A1c, CMP, and TSH. Will refill levothyroxine after TSH results.     Follow-up in 2 months to see how she is doing on Zoloft.  Instructed to call back sooner if she is not experiencing any improvement.    Spent 30 minutes with patient and greater than 50 percent of time spent counseling patient.

## 2016-07-02 NOTE — Addendum Note (Signed)
Addended by: Jomarie LongsBREEBACK, Carlisle Enke L on: 07/02/2016 05:36 PM   Modules accepted: Level of Service

## 2016-07-03 ENCOUNTER — Other Ambulatory Visit: Payer: Self-pay | Admitting: *Deleted

## 2016-07-03 LAB — TSH: TSH: 1.92 mIU/L

## 2016-07-03 LAB — COMPLETE METABOLIC PANEL WITH GFR
ALT: 15 U/L (ref 6–29)
AST: 14 U/L (ref 10–35)
Albumin: 3.8 g/dL (ref 3.6–5.1)
Alkaline Phosphatase: 73 U/L (ref 33–115)
BUN: 14 mg/dL (ref 7–25)
CO2: 26 mmol/L (ref 20–31)
CREATININE: 1 mg/dL (ref 0.50–1.10)
Calcium: 9.4 mg/dL (ref 8.6–10.2)
Chloride: 105 mmol/L (ref 98–110)
GFR, Est African American: 78 mL/min (ref >=60)
GFR, Est Non African American: 67 mL/min (ref >=60)
Glucose, Bld: 94 mg/dL (ref 65–99)
Potassium: 4.3 mmol/L (ref 3.5–5.3)
Sodium: 140 mmol/L (ref 135–146)
Total Bilirubin: 0.3 mg/dL (ref 0.2–1.2)
Total Protein: 6.7 g/dL (ref 6.1–8.1)

## 2016-07-03 LAB — HEMOGLOBIN A1C
HEMOGLOBIN A1C: 5.6 % (ref <5.7)
MEAN PLASMA GLUCOSE: 114 mg/dL

## 2016-07-03 MED ORDER — LEVOTHYROXINE SODIUM 137 MCG PO TABS: ORAL_TABLET | ORAL | 3 refills | Status: DC

## 2016-07-03 NOTE — Progress Notes (Signed)
Call pt: A!C is 5.6. Just barely out of pre-diabetes range.  Thyroid is perfect please send over 1 year of 90 day refills.

## 2016-07-04 LAB — VITAMIN D 25 HYDROXY (VIT D DEFICIENCY, FRACTURES): Vit D, 25-Hydroxy: 22 ng/mL — ABNORMAL LOW (ref 30–100)

## 2016-07-06 ENCOUNTER — Other Ambulatory Visit: Payer: Self-pay | Admitting: Physician Assistant

## 2016-08-29 ENCOUNTER — Other Ambulatory Visit: Payer: Self-pay | Admitting: Physician Assistant

## 2016-08-29 DIAGNOSIS — F418 Other specified anxiety disorders: Secondary | ICD-10-CM

## 2016-09-03 ENCOUNTER — Ambulatory Visit: Payer: BLUE CROSS/BLUE SHIELD | Admitting: Physician Assistant

## 2016-09-10 ENCOUNTER — Ambulatory Visit: Payer: BLUE CROSS/BLUE SHIELD | Admitting: Physician Assistant

## 2016-09-10 ENCOUNTER — Other Ambulatory Visit: Payer: Self-pay | Admitting: Physician Assistant

## 2016-09-10 DIAGNOSIS — F418 Other specified anxiety disorders: Secondary | ICD-10-CM

## 2016-09-10 MED ORDER — SERTRALINE HCL 50 MG PO TABS: 50.0000 mg | ORAL_TABLET | Freq: Every day | ORAL | 0 refills | Status: DC

## 2016-09-12 ENCOUNTER — Other Ambulatory Visit: Payer: Self-pay | Admitting: *Deleted

## 2016-09-12 DIAGNOSIS — F418 Other specified anxiety disorders: Secondary | ICD-10-CM

## 2016-09-12 MED ORDER — SERTRALINE HCL 50 MG PO TABS: 50.0000 mg | ORAL_TABLET | Freq: Every day | ORAL | 0 refills | Status: DC

## 2016-09-19 ENCOUNTER — Ambulatory Visit (INDEPENDENT_AMBULATORY_CARE_PROVIDER_SITE_OTHER): Payer: BLUE CROSS/BLUE SHIELD | Admitting: Physician Assistant

## 2016-09-19 VITALS — BP 134/79 | HR 82 | Ht 66.0 in | Wt 286.0 lb

## 2016-09-19 DIAGNOSIS — F4323 Adjustment disorder with mixed anxiety and depressed mood: Secondary | ICD-10-CM | POA: Diagnosis not present

## 2016-09-19 DIAGNOSIS — F418 Other specified anxiety disorders: Secondary | ICD-10-CM

## 2016-09-19 MED ORDER — SERTRALINE HCL 50 MG PO TABS: 50.0000 mg | ORAL_TABLET | Freq: Every day | ORAL | 3 refills | Status: DC

## 2016-09-19 NOTE — Progress Notes (Signed)
   Subjective:    Patient ID: Amanda Davidson, female    DOB: [birth year], 46 y.o.   MRN: [id number]  HPI Pt is a 46 yo female who presents to the clinic for 2 month follow up after starting zoloft for anxiety and depression. Her daughter is still really sick with anorexia and in a inpatient facility but she is doing much better with dealing with it. She feels stable and she can handle work and supporting her daughter. She is going through counseling as well. No side effects of zoloft.    Review of Systems  All other systems reviewed and are negative.      Objective:   Physical Exam  Constitutional: She is oriented to person, place, and time. She appears well-developed and well-nourished.  HENT:  Head: Normocephalic and atraumatic.  Cardiovascular: Normal rate, regular rhythm and normal heart sounds.   Pulmonary/Chest: Effort normal and breath sounds normal.  Neurological: She is alert and oriented to person, place, and time.  Psychiatric: She has a normal mood and affect. Her behavior is normal.          Assessment & Plan:  Marland KitchenMarland KitchenDiagnoses and all orders for this visit:  Depression with anxiety -     sertraline (ZOLOFT) 50 MG tablet; Take 1 tablet (50 mg total) by mouth daily.  Adjustment disorder with mixed anxiety and depressed mood -     sertraline (ZOLOFT) 50 MG tablet; Take 1 tablet (50 mg total) by mouth daily.   Pt is doing great.  .. Depression screen [facility] 2/9 09/19/2016  Decreased Interest 0  Down, Depressed, Hopeless 0  PHQ - 2 Score 0    refilled for one year.  Continue with counseling.

## 2016-09-26 DIAGNOSIS — L2082 Flexural eczema: Secondary | ICD-10-CM | POA: Diagnosis not present

## 2016-09-26 DIAGNOSIS — L910 Hypertrophic scar: Secondary | ICD-10-CM | POA: Diagnosis not present

## 2016-09-26 DIAGNOSIS — L814 Other melanin hyperpigmentation: Secondary | ICD-10-CM | POA: Diagnosis not present

## 2016-09-26 DIAGNOSIS — L905 Scar conditions and fibrosis of skin: Secondary | ICD-10-CM | POA: Diagnosis not present

## 2016-12-05 ENCOUNTER — Other Ambulatory Visit: Payer: Self-pay | Admitting: Physician Assistant

## 2016-12-05 DIAGNOSIS — F418 Other specified anxiety disorders: Secondary | ICD-10-CM

## 2016-12-11 ENCOUNTER — Encounter (HOSPITAL_COMMUNITY): Payer: Self-pay

## 2017-01-01 ENCOUNTER — Other Ambulatory Visit: Payer: Self-pay | Admitting: Physician Assistant

## 2017-01-01 DIAGNOSIS — F418 Other specified anxiety disorders: Secondary | ICD-10-CM

## 2017-01-09 ENCOUNTER — Other Ambulatory Visit: Payer: Self-pay | Admitting: Physician Assistant

## 2017-01-09 DIAGNOSIS — F418 Other specified anxiety disorders: Secondary | ICD-10-CM

## 2017-02-11 ENCOUNTER — Encounter (INDEPENDENT_AMBULATORY_CARE_PROVIDER_SITE_OTHER): Payer: Self-pay

## 2017-02-17 ENCOUNTER — Other Ambulatory Visit: Payer: Self-pay | Admitting: Physician Assistant

## 2017-02-17 DIAGNOSIS — F418 Other specified anxiety disorders: Secondary | ICD-10-CM

## 2017-02-18 ENCOUNTER — Encounter (INDEPENDENT_AMBULATORY_CARE_PROVIDER_SITE_OTHER): Payer: Self-pay | Admitting: Family Medicine

## 2017-02-18 ENCOUNTER — Ambulatory Visit (INDEPENDENT_AMBULATORY_CARE_PROVIDER_SITE_OTHER): Payer: BLUE CROSS/BLUE SHIELD | Admitting: Family Medicine

## 2017-02-18 VITALS — BP 126/74 | HR 66 | Temp 98.2°F | Ht 66.0 in | Wt 292.0 lb

## 2017-02-18 DIAGNOSIS — E038 Other specified hypothyroidism: Secondary | ICD-10-CM

## 2017-02-18 DIAGNOSIS — Z1389 Encounter for screening for other disorder: Secondary | ICD-10-CM

## 2017-02-18 DIAGNOSIS — Z0289 Encounter for other administrative examinations: Secondary | ICD-10-CM

## 2017-02-18 DIAGNOSIS — R0602 Shortness of breath: Secondary | ICD-10-CM

## 2017-02-18 DIAGNOSIS — R5383 Other fatigue: Secondary | ICD-10-CM

## 2017-02-18 DIAGNOSIS — E669 Obesity, unspecified: Secondary | ICD-10-CM | POA: Diagnosis not present

## 2017-02-18 DIAGNOSIS — Z1331 Encounter for screening for depression: Secondary | ICD-10-CM

## 2017-02-18 DIAGNOSIS — Z6841 Body Mass Index (BMI) 40.0 and over, adult: Secondary | ICD-10-CM

## 2017-02-18 DIAGNOSIS — E119 Type 2 diabetes mellitus without complications: Secondary | ICD-10-CM

## 2017-02-18 DIAGNOSIS — IMO0001 Reserved for inherently not codable concepts without codable children: Secondary | ICD-10-CM

## 2017-02-18 NOTE — Progress Notes (Signed)
Office: [phone]  /  Fax: [phone]   Dear Dr. Daphine Deutscher,   Thank you for referring Jack Quarto to our clinic. The following note includes my evaluation and treatment recommendations.  HPI:   Chief Complaint: OBESITY    Abbagale P Braaten has been referred by [place]. Daphine Deutscher, MD for consultation regarding her obesity and obesity related comorbidities.    ROZANN HOLTS (MR# [id number]) is a 46 y.o. female who presents on 02/18/2017 for obesity evaluation and treatment. Current BMI is Body mass index is 47.13 kg/m.Efraim Kaufmann had Gastric Sleeve in 2014. Her heaviest preoperative weight was 283 lbs and she lost down to 220 lbs within 6 months. Donnamaria kept this weight off for about 12 months before starting to regain weight and has now regained 72 lbs from their lowest postoperative weight. Noam increased drinking liquids with calories and decreased her protein intake.     Takeya attended our information session and states she is currently in the action stage of change and ready to dedicate time achieving and maintaining a healthier weight. Dorathy requests to join our Back-On-Track program to help manage their weight and relearn how to use their weight loss surgery to achieve improved health.    Espyn states her family eats meals together she thinks her family will eat healthier with  her her desired weight loss is 115 lbs she has been heavy most of  her life she started gaining weight during pregnancy her heaviest weight ever was 315 lbs. she is a picky eater and doesn't like to eat healthier foods  she has significant food cravings issues  she snacks frequently in the evenings she is frequently drinking liquids with calories she frequently makes poor food choices she frequently eats larger portions than normal  she has binge eating behaviors she struggles with emotional eating    Fatigue Jayah feels her energy is lower than it should be. This has worsened with  weight gain and has not worsened recently. Gerrianne admits to daytime somnolence and  admits to waking up still tired. Patient is at risk for obstructive sleep apnea. Patent has a history of symptoms of daytime fatigue and morning fatigue. Patient generally gets 5 or 6 hours of sleep per night, and states they generally have restless sleep. Snoring is present. Apneic episodes are present. Epworth Sleepiness Score is 12  Dyspnea on exertion Izzie notes increasing shortness of breath with exercising and seems to be worsening over time with weight gain. She notes getting out of breath sooner with activity than she used to. This has not gotten worse recently. Lari denies orthopnea.  Hypothyroid Cambri has a diagnosis of hypothyroidism. She is on synthroid. She denies hot or cold intolerance or palpitations, but does admit to ongoing fatigue.  Diabetes II Jiya has a diagnosis of diabetes type II. She was diagnosed before weight loss surgery. Marites states she is not checking BGs and denies any hypoglycemic episodes. There is no recent A1c result. She is attempting to work on intensive lifestyle modifications including diet, exercise, and weight loss to help control her blood glucose levels.   Depression Screen Raylin's Food and Mood (modified PHQ-9) score was  Depression screen PHQ 2/9 02/18/2017  Decreased Interest 2  Down, Depressed, Hopeless 2  PHQ - 2 Score 4  Altered sleeping 3  Tired, decreased energy 3  Change in appetite 2  Feeling bad or failure about yourself  3  Trouble concentrating 3  Moving slowly or fidgety/restless 2  Suicidal  thoughts 0  PHQ-9 Score 20    ALLERGIES: No Known Allergies  MEDICATIONS: Current Outpatient Prescriptions on File Prior to Visit  Medication Sig Dispense Refill  . albuterol (PROVENTIL HFA;VENTOLIN HFA) 108 (90 Base) MCG/ACT inhaler Inhale 2 puffs into lungs every 6 hours as needed for wheezing 3 Inhaler 1  . clonazePAM (KLONOPIN) 0.5 MG  tablet TAKE ONE TABLET BY MOUTH TWO TIMES DAILY AS NEEDED FOR ANXIETY 30 tablet 1  . levothyroxine (SYNTHROID, LEVOTHROID) 137 MCG tablet TAKE ONE TABLET BY MOUTH EVERY DAY BEFORE BREAKFAST 90 tablet 3  . montelukast (SINGULAIR) 10 MG tablet TAKE 1 TABLET (10 MG TOTAL) BY MOUTH AT BEDTIME. 90 tablet 4  . sertraline (ZOLOFT) 50 MG tablet Take 1 tablet (50 mg total) by mouth daily. 90 tablet 3  . SUMAtriptan (IMITREX) 100 MG tablet TAKE ONE TABLET BY MOUTH EVERY TWO HOURS AS NEEDED 10 tablet 4   No current facility-administered medications on file prior to visit.     PAST MEDICAL HISTORY: Past Medical History:  Diagnosis Date  . Anemia   . Asthma   . Complication of anesthesia    NAUSEA  . Depression   . Diabetes mellitus without complication (HCC)   . Drug use   . Exercise-induced asthma   . GERD (gastroesophageal reflux disease)   . Headache(784.0)    MIGRAINES  . Hypertension   . Hypothyroidism   . IBS (irritable bowel syndrome)   . Migraines   . Obesity   . Post-nasal drip   . Shortness of breath   . Sleep apnea    STOPPPED USING C-PAP 6 MO AGO  . Vitamin D deficiency     PAST SURGICAL HISTORY: Past Surgical History:  Procedure Laterality Date  . HERNIA REPAIR    . LAPAROSCOPIC GASTRIC SLEEVE RESECTION N/A 05/14/2013   Procedure: LAPAROSCOPIC GASTRIC SLEEVE RESECTION;  Surgeon: Valarie Merino, MD;  Location: [facility];  Service: General;  Laterality: N/A;  . UMBILICAL HERNIA REPAIR  05/14/2013   Procedure: HERNIA REPAIR UMBILICAL ADULT;  Surgeon: Valarie Merino, MD;  Location: [facility];  Service: General;;  . UPPER GI ENDOSCOPY  05/14/2013   Procedure: UPPER GI ENDOSCOPY;  Surgeon: Valarie Merino, MD;  Location: [facility];  Service: General;;    SOCIAL HISTORY: Social History  Substance Use Topics  . Smoking status: Never Smoker  . Smokeless tobacco: Never Used  . Alcohol use 0.5 oz/week    1 drink(s) per week     Comment: 2 glasses wine @ 2x/month    FAMILY  HISTORY: Family History  Problem Relation Age of Onset  . Hypertension Mother   . Heart disease Mother   . Depression Mother   . Hypertension Father   . Diabetes Father   . Sleep apnea Father   . Heart disease Maternal Grandfather   . Heart attack Maternal Grandfather   . Cancer Maternal Grandfather        bladder  . Hyperlipidemia Other   . Sleep apnea Other     ROS: Review of Systems  Constitutional: Positive for malaise/fatigue.  Eyes:       Wear Glasses or Contacts  Respiratory: Positive for cough, shortness of breath (with activity) and wheezing.   Cardiovascular: Negative for palpitations and orthopnea.  Gastrointestinal: Positive for diarrhea.  Musculoskeletal:       Muscle Stiffness Red or Swollen Joints  Skin: Positive for rash.  Neurological: Positive for headaches.  Endo/Heme/Allergies: Bruises/bleeds easily (easy bruising).  Hay Fever Negative Heat/Cold Intolerance  Psychiatric/Behavioral: Positive for depression.       Stress    PHYSICAL EXAM: Blood pressure 126/74, pulse 66, temperature 98.2 F (36.8 C), height  (1.676 m), weight 292 lb (132.5 kg), SpO2 99 %. Body mass index is 47.13 kg/m. Physical Exam  Constitutional: She is oriented to person, place, and time. She appears well-developed and well-nourished.  Cardiovascular: Normal rate.   Pulmonary/Chest: Effort normal.  Musculoskeletal: Normal range of motion.  Neurological: She is oriented to person, place, and time.  Skin: Skin is warm and dry.  Psychiatric: She has a normal mood and affect. Her behavior is normal.  Vitals reviewed.   RECENT LABS AND TESTS: BMET    Component Value Date/Time   NA 140 07/02/2016 1602   K 4.3 07/02/2016 1602   CL 105 07/02/2016 1602   CO2 26 07/02/2016 1602   GLUCOSE 94 07/02/2016 1602   BUN 14 07/02/2016 1602   CREATININE 1.00 07/02/2016 1602   CALCIUM 9.4 07/02/2016 1602   GFRNONAA 67 07/02/2016 1602   GFRAA 78 07/02/2016 1602   Lab  Results  Component Value Date   HGBA1C 5.6 07/02/2016   No results found for: INSULIN CBC    Component Value Date/Time   WBC 8.7 07/03/2015 0941   WBC 8.7 07/03/2015 0941   RBC 4.75 07/03/2015 0941   RBC 4.75 07/03/2015 0941   HGB 14.1 07/03/2015 0941   HGB 14.1 07/03/2015 0941   HCT 41.4 07/03/2015 0941   HCT 41.4 07/03/2015 0941   PLT 349 07/03/2015 0941   PLT 349 07/03/2015 0941   MCV 87.2 07/03/2015 0941   MCV 87.2 07/03/2015 0941   MCH 29.7 07/03/2015 0941   MCH 29.7 07/03/2015 0941   MCHC 34.1 07/03/2015 0941   MCHC 34.1 07/03/2015 0941   RDW 13.5 07/03/2015 0941   RDW 13.5 07/03/2015 0941   LYMPHSABS 2.1 07/03/2015 0941   MONOABS 0.6 07/03/2015 0941   EOSABS 0.1 07/03/2015 0941   BASOSABS 0.0 07/03/2015 0941   Iron/TIBC/Ferritin/ %Sat    Component Value Date/Time   FERRITIN 147 07/03/2015 0941   Lipid Panel     Component Value Date/Time   CHOL 166 08/26/2014 1020   TRIG 103 08/26/2014 1020   HDL 53 08/26/2014 1020   CHOLHDL 3.1 08/26/2014 1020   VLDL 21 08/26/2014 1020   LDLCALC 92 08/26/2014 1020   Hepatic Function Panel     Component Value Date/Time   PROT 6.7 07/02/2016 1602   ALBUMIN 3.8 07/02/2016 1602   AST 14 07/02/2016 1602   ALT 15 07/02/2016 1602   ALKPHOS 73 07/02/2016 1602   BILITOT 0.3 07/02/2016 1602      Component Value Date/Time   TSH 1.92 07/02/2016 1602   TSH 1.401 07/03/2015 0941   TSH 1.227 08/26/2014 1020    ECG  shows NSR with a rate of 60 BPM INDIRECT CALORIMETER done today shows a VO2 of 243 and a REE of 1690. Her calculated basal metabolic rate is 9604 thus her basal metabolic rate is worse than expected.    ASSESSMENT AND PLAN: Other fatigue - Plan: EKG 12-Lead, Vitamin B12, CBC With Differential, Folate, Lipid Panel With LDL/HDL Ratio, VITAMIN D 25 Hydroxy (Vit-D Deficiency, Fractures)  Shortness of breath on exertion  Other specified hypothyroidism - Plan: T3, T4, free, TSH  Type 2 diabetes mellitus without  complication, without long-term current use of insulin (HCC) - Plan: Comprehensive metabolic panel, Hemoglobin A1c, Insulin, random, Microalbumin /  creatinine urine ratio  Depression screening  Class 3 obesity with serious comorbidity and body mass index (BMI) of 45.0 to 49.9 in adult, unspecified obesity type (HCC)  PLAN: Fatigue Lequisha was informed that her fatigue may be related to obesity, depression or many other causes. Labs will be ordered, and in the meanwhile Latria has agreed to work on diet, exercise and weight loss to help with fatigue. Proper sleep hygiene was discussed including the need for 7-8 hours of quality sleep each night. A sleep study was not ordered based on symptoms and Epworth score.  Dyspnea on exertion Devetta's shortness of breath appears to be obesity related and exercise induced. She has agreed to work on weight loss and gradually increase exercise to treat her exercise induced shortness of breath. If Michaele follows our instructions and loses weight without improvement of her shortness of breath, we will plan to refer to pulmonology. We will monitor this condition regularly. Marrion agrees to this plan.  Hypothyroid Sani was informed of the importance of good thyroid control to help with weight loss efforts. She was also informed that supertheraputic thyroid levels are dangerous and will not improve weight loss results. We will check labs and Jessey will continue her medications as prescribed. Rayli agrees to follow up with our clinic in 1 week.  Diabetes II Jordon has been given extensive diabetes education by myself today including ideal fasting and post-prandial blood glucose readings, individual ideal Hgb A1c goals and hypoglycemia prevention. We discussed the importance of good blood sugar control to decrease the likelihood of diabetic complications such as nephropathy, neuropathy, limb loss, blindness, coronary artery disease, and death. We discussed  the importance of intensive lifestyle modification including diet, exercise and weight loss as the first line treatment for diabetes. We will check labs and follow.   Depression Screen Patrycja had a strongly positive depression screening. Depression is commonly associated with obesity and often results in emotional eating behaviors. We will monitor this closely and work on CBT to help improve the non-hunger eating patterns. Referral to Psychology may be required if no improvement is seen as she continues in our clinic.  Obesity Enedina is currently in the action stage of change and her goal is to continue with weight loss efforts. I recommend Jourden begin the structured treatment plan as follows:  She has agreed to keep a food journal with 1200 to 1450 calories and 80+ grams of protein daily Lorenza has been instructed to eventually work up to a goal of 150 minutes of combined cardio and strengthening exercise per week for weight loss and overall health benefits. We discussed the following Behavioral Modification Strategies today: keep a strict food journal, increasing lean protein intake, decreasing simple carbohydrates and work on meal planning and easy cooking plans  Beyla has agreed to join our [employer] and follow up with our clinic in 1 week. She was informed of the importance of frequent follow up visits to maximize her success with intensive lifestyle modifications for her multiple health conditions. She was informed we would discuss her lab results at her next visit unless there is a critical issue that needs to be addressed sooner. Nakeeta agreed to keep her next visit at the agreed upon time to discuss these results.  I, Nevada Crane, am acting as transcriptionist for Quillian Quince, MD  I have reviewed the above documentation for accuracy and completeness, and I agree with the above. -Quillian Quince, MD   OBESITY BEHAVIORAL INTERVENTION VISIT  Today's visit  was # 1 out  of 22.  Starting weight: 292 lbs Starting date: 02/18/17 Today's weight : 292 lbs  Today's date: 02/18/2017 Total lbs lost to date: 0 (Patients must lose 7 lbs in the first 6 months to continue with counseling)   ASK: We discussed the diagnosis of obesity with Mechel P Prien today and [employer] agreed to give [place] permission to discuss obesity behavioral modification therapy today.  ASSESS: Jaslene has the diagnosis of obesity and her BMI today is 47.15 Lumi is in the action stage of change   ADVISE: Domini was educated on the multiple health risks of obesity as well as the benefit of weight loss to improve her health. She was advised of the need for long term treatment and the importance of lifestyle modifications.  AGREE: Multiple dietary modification options and treatment options were discussed and  Chalsea agreed to keep a food journal with 1200 to 1450 calories and 80+ grams of protein daily We discussed the following Behavioral Modification Strategies today: keep a strict food journal, increasing lean protein intake, decreasing simple carbohydrates and work on meal planning and easy cooking plans

## 2017-02-19 LAB — CBC WITH DIFFERENTIAL
BASOS: 1 %
Basophils Absolute: 0.1 10*3/uL (ref 0.0–0.2)
EOS (ABSOLUTE): 0.1 10*3/uL (ref 0.0–0.4)
EOS: 1 %
HEMATOCRIT: 40.7 % (ref 34.0–46.6)
HEMOGLOBIN: 13.3 g/dL (ref 11.1–15.9)
IMMATURE GRANULOCYTES: 0 %
Immature Grans (Abs): 0 10*3/uL (ref 0.0–0.1)
Lymphocytes Absolute: 2.5 10*3/uL (ref 0.7–3.1)
Lymphs: 26 %
MCH: 28.1 pg (ref 26.6–33.0)
MCHC: 32.7 g/dL (ref 31.5–35.7)
MCV: 86 fL (ref 79–97)
MONOCYTES: 6 %
MONOS ABS: 0.6 10*3/uL (ref 0.1–0.9)
Neutrophils Absolute: 6.4 10*3/uL (ref 1.4–7.0)
Neutrophils: 66 %
RBC: 4.73 x10E6/uL (ref 3.77–5.28)
RDW: 14.9 % (ref 12.3–15.4)
WBC: 9.6 10*3/uL (ref 3.4–10.8)

## 2017-02-19 LAB — COMPREHENSIVE METABOLIC PANEL
ALBUMIN: 3.9 g/dL (ref 3.5–5.5)
ALK PHOS: 91 IU/L (ref 39–117)
ALT: 22 IU/L (ref 0–32)
AST: 23 IU/L (ref 0–40)
Albumin/Globulin Ratio: 1.5 (ref 1.2–2.2)
BUN / CREAT RATIO: 20 (ref 9–23)
BUN: 13 mg/dL (ref 6–24)
Bilirubin Total: 0.5 mg/dL (ref 0.0–1.2)
CALCIUM: 9.5 mg/dL (ref 8.7–10.2)
CO2: 24 mmol/L (ref 20–29)
CREATININE: 0.65 mg/dL (ref 0.57–1.00)
Chloride: 100 mmol/L (ref 96–106)
GFR calc Af Amer: 122 mL/min/{1.73_m2} (ref >59)
GFR, EST NON AFRICAN AMERICAN: 106 mL/min/{1.73_m2} (ref >59)
GLOBULIN, TOTAL: 2.6 g/dL (ref 1.5–4.5)
GLUCOSE: 92 mg/dL (ref 65–99)
Potassium: 4.6 mmol/L (ref 3.5–5.2)
SODIUM: 137 mmol/L (ref 134–144)
TOTAL PROTEIN: 6.5 g/dL (ref 6.0–8.5)

## 2017-02-19 LAB — FOLATE: FOLATE: 12.9 ng/mL (ref >3.0)

## 2017-02-19 LAB — INSULIN, RANDOM: INSULIN: 12.2 u[IU]/mL (ref 2.6–24.9)

## 2017-02-19 LAB — MICROALBUMIN / CREATININE URINE RATIO
Creatinine, Urine: 51.3 mg/dL
Microalb/Creat Ratio: <5.8 mg/g creat (ref 0.0–30.0)
Microalbumin, Urine: <3 ug/mL

## 2017-02-19 LAB — HEMOGLOBIN A1C
Est. average glucose Bld gHb Est-mCnc: 126 mg/dL
Hgb A1c MFr Bld: 6 % — ABNORMAL HIGH (ref 4.8–5.6)

## 2017-02-19 LAB — VITAMIN D 25 HYDROXY (VIT D DEFICIENCY, FRACTURES): Vit D, 25-Hydroxy: 27.4 ng/mL — ABNORMAL LOW (ref 30.0–100.0)

## 2017-02-19 LAB — VITAMIN B12: Vitamin B-12: 948 pg/mL (ref 232–1245)

## 2017-02-19 LAB — LIPID PANEL WITH LDL/HDL RATIO
Cholesterol, Total: 186 mg/dL (ref 100–199)
HDL: 46 mg/dL (ref >39)
LDL CALC: 113 mg/dL — AB (ref 0–99)
LDL/HDL RATIO: 2.5 ratio (ref 0.0–3.2)
TRIGLYCERIDES: 137 mg/dL (ref 0–149)
VLDL CHOLESTEROL CAL: 27 mg/dL (ref 5–40)

## 2017-02-19 LAB — T4, FREE: Free T4: 1.35 ng/dL (ref 0.82–1.77)

## 2017-02-19 LAB — TSH: TSH: 1.96 u[IU]/mL (ref 0.450–4.500)

## 2017-02-19 LAB — T3: T3, Total: 106 ng/dL (ref 71–180)

## 2017-02-25 ENCOUNTER — Other Ambulatory Visit: Payer: Self-pay | Admitting: Physician Assistant

## 2017-02-25 ENCOUNTER — Ambulatory Visit (INDEPENDENT_AMBULATORY_CARE_PROVIDER_SITE_OTHER): Payer: BLUE CROSS/BLUE SHIELD | Admitting: Family Medicine

## 2017-02-25 VITALS — BP 123/81 | HR 70 | Temp 98.3°F | Ht 66.0 in | Wt 289.0 lb

## 2017-02-25 DIAGNOSIS — E119 Type 2 diabetes mellitus without complications: Secondary | ICD-10-CM

## 2017-02-25 DIAGNOSIS — E559 Vitamin D deficiency, unspecified: Secondary | ICD-10-CM

## 2017-02-25 DIAGNOSIS — E669 Obesity, unspecified: Secondary | ICD-10-CM | POA: Diagnosis not present

## 2017-02-25 DIAGNOSIS — Z9889 Other specified postprocedural states: Secondary | ICD-10-CM | POA: Diagnosis not present

## 2017-02-25 DIAGNOSIS — Z6841 Body Mass Index (BMI) 40.0 and over, adult: Secondary | ICD-10-CM

## 2017-02-25 DIAGNOSIS — IMO0001 Reserved for inherently not codable concepts without codable children: Secondary | ICD-10-CM

## 2017-02-25 MED ORDER — VITAMIN D (ERGOCALCIFEROL) 1.25 MG (50000 UNIT) PO CAPS: 50000.0000 [IU] | ORAL_CAPSULE | ORAL | 1 refills | Status: DC

## 2017-02-25 NOTE — Progress Notes (Signed)
Office: [phone]  /  Fax: [phone]   HPI:   Chief Complaint: OBESITY Amanda Davidson is here to discuss her progress with her obesity treatment plan. She is on the keep a food journal with 1200 to 1450 calories and 80+ grams of protein daily and is following her eating plan approximately 100 % of the time. She states she is walking for 30 minutes 7 times per week. Amanda Davidson is status post Gastric Sleeve Amanda Davidson did well with journaling but she sometimes went over her calorie goal. She has to be careful discussing food with her daughter who is recovering from anorexia. Her weight is 289 lb (131.1 kg) today and has had a weight loss of 3 pounds over a period of 1 week since her last visit. She has lost 3 lbs since starting treatment with [place].  Vitamin D deficiency Amanda Davidson has a diagnosis of vitamin D deficiency. She is currently on vitamins. She is status post weight loss surgery and admits fatigue and denies nausea, vomiting or muscle weakness.  Diabetes II Amanda Davidson has a diagnosis of diabetes type II. She is status post gastric sleeve and A1c increased to 6.0 but is still controlled with diet and surgery. Amanda Davidson states she is not checking fasting BGs at home regularly and denies any hypoglycemic episodes. She has been working on intensive lifestyle modifications including diet, exercise, and weight loss to help control her blood glucose levels.   ALLERGIES: No Known Allergies  MEDICATIONS: Current Outpatient Prescriptions on File Prior to Visit  Medication Sig Dispense Refill   albuterol (PROVENTIL HFA;VENTOLIN HFA) 108 (90 Base) MCG/ACT inhaler Inhale 2 puffs into lungs every 6 hours as needed for wheezing 3 Inhaler 1   clonazePAM (KLONOPIN) 0.5 MG tablet TAKE ONE TABLET BY MOUTH TWO TIMES DAILY AS NEEDED FOR ANXIETY 30 tablet 1   levothyroxine (SYNTHROID, LEVOTHROID) 137 MCG tablet TAKE ONE TABLET BY MOUTH EVERY DAY BEFORE BREAKFAST 90 tablet 3   montelukast (SINGULAIR) 10 MG tablet  TAKE 1 TABLET (10 MG TOTAL) BY MOUTH AT BEDTIME. 90 tablet 4   sertraline (ZOLOFT) 50 MG tablet Take 1 tablet (50 mg total) by mouth daily. 90 tablet 3   No current facility-administered medications on file prior to visit.     PAST MEDICAL HISTORY: Past Medical History:  Diagnosis Date   Anemia    Asthma    Complication of anesthesia    NAUSEA   Depression    Diabetes mellitus without complication (HCC)    Drug use    Exercise-induced asthma    GERD (gastroesophageal reflux disease)    Headache(784.0)    MIGRAINES   Hypertension    Hypothyroidism    IBS (irritable bowel syndrome)    Migraines    Obesity    Post-nasal drip    Shortness of breath    Sleep apnea    STOPPPED USING C-PAP 6 MO AGO   Vitamin D deficiency     PAST SURGICAL HISTORY: Past Surgical History:  Procedure Laterality Date   HERNIA REPAIR     LAPAROSCOPIC GASTRIC SLEEVE RESECTION N/A 05/14/2013   Procedure: LAPAROSCOPIC GASTRIC SLEEVE RESECTION;  Surgeon: Valarie Merino, MD;  Location: [facility];  Service: General;  Laterality: N/A;   UMBILICAL HERNIA REPAIR  05/14/2013   Procedure: HERNIA REPAIR UMBILICAL ADULT;  Surgeon: Valarie Merino, MD;  Location: [facility];  Service: General;;   UPPER GI ENDOSCOPY  05/14/2013   Procedure: UPPER GI ENDOSCOPY;  Surgeon: Valarie Merino, MD;  Location:  [facility];  Service: General;;    SOCIAL HISTORY: Social History  Substance Use Topics   Smoking status: Never Smoker   Smokeless tobacco: Never Used   Alcohol use 0.5 oz/week    1 drink(s) per week     Comment: 2 glasses wine @ 2x/month    FAMILY HISTORY: Family History  Problem Relation Age of Onset   Hypertension Mother    Heart disease Mother    Depression Mother    Hypertension Father    Diabetes Father    Sleep apnea Father    Heart disease Maternal Grandfather    Heart attack Maternal Grandfather    Cancer Maternal Grandfather        bladder   Hyperlipidemia  Other    Sleep apnea Other     ROS: Review of Systems  Constitutional: Positive for malaise/fatigue and weight loss.  Gastrointestinal: Negative for nausea and vomiting.  Musculoskeletal:       Negative muscle weakness  Endo/Heme/Allergies:       Negative hypoglycemia    PHYSICAL EXAM: Blood pressure 123/81, pulse 70, temperature 98.3 F (36.8 C), temperature source Oral, height  (1.676 m), weight 289 lb (131.1 kg), SpO2 97 %. Body mass index is 46.65 kg/m. Physical Exam  Constitutional: She is oriented to person, place, and time. She appears well-developed and well-nourished.  Cardiovascular: Normal rate.   Pulmonary/Chest: Effort normal.  Musculoskeletal: Normal range of motion.  Neurological: She is oriented to person, place, and time.  Skin: Skin is warm and dry.  Psychiatric: She has a normal mood and affect. Her behavior is normal.  Vitals reviewed.   RECENT LABS AND TESTS: BMET    Component Value Date/Time   NA 137 02/18/2017 1134   K 4.6 02/18/2017 1134   CL 100 02/18/2017 1134   CO2 24 02/18/2017 1134   GLUCOSE 92 02/18/2017 1134   GLUCOSE 94 07/02/2016 1602   BUN 13 02/18/2017 1134   CREATININE 0.65 02/18/2017 1134   CREATININE 1.00 07/02/2016 1602   CALCIUM 9.5 02/18/2017 1134   GFRNONAA 106 02/18/2017 1134   GFRNONAA 67 07/02/2016 1602   GFRAA 122 02/18/2017 1134   GFRAA 78 07/02/2016 1602   Lab Results  Component Value Date   HGBA1C 6.0 (H) 02/18/2017   HGBA1C 5.6 07/02/2016   HGBA1C 5.8 (H) 07/03/2015   HGBA1C 5.5 07/03/2015   HGBA1C 5.7 (H) 08/26/2014   Lab Results  Component Value Date   INSULIN 12.2 02/18/2017   CBC    Component Value Date/Time   WBC 9.6 02/18/2017 1134   WBC 8.7 07/03/2015 0941   WBC 8.7 07/03/2015 0941   RBC 4.73 02/18/2017 1134   RBC 4.75 07/03/2015 0941   RBC 4.75 07/03/2015 0941   HGB 13.3 02/18/2017 1134   HCT 40.7 02/18/2017 1134   PLT 349 07/03/2015 0941   PLT 349 07/03/2015 0941   MCV 86  02/18/2017 1134   MCH 28.1 02/18/2017 1134   MCH 29.7 07/03/2015 0941   MCH 29.7 07/03/2015 0941   MCHC 32.7 02/18/2017 1134   MCHC 34.1 07/03/2015 0941   MCHC 34.1 07/03/2015 0941   RDW 14.9 02/18/2017 1134   LYMPHSABS 2.5 02/18/2017 1134   MONOABS 0.6 07/03/2015 0941   EOSABS 0.1 02/18/2017 1134   BASOSABS 0.1 02/18/2017 1134   Iron/TIBC/Ferritin/ %Sat    Component Value Date/Time   FERRITIN 147 07/03/2015 0941   Lipid Panel     Component Value Date/Time   CHOL 186 02/18/2017  1134   TRIG 137 02/18/2017 1134   HDL 46 02/18/2017 1134   CHOLHDL 3.1 08/26/2014 1020   VLDL 21 08/26/2014 1020   LDLCALC 113 (H) 02/18/2017 1134   Hepatic Function Panel     Component Value Date/Time   PROT 6.5 02/18/2017 1134   ALBUMIN 3.9 02/18/2017 1134   AST 23 02/18/2017 1134   ALT 22 02/18/2017 1134   ALKPHOS 91 02/18/2017 1134   BILITOT 0.5 02/18/2017 1134      Component Value Date/Time   TSH 1.960 02/18/2017 1134   TSH 1.92 07/02/2016 1602   TSH 1.401 07/03/2015 0941    ASSESSMENT AND PLAN: Vitamin D deficiency - Plan: Vitamin D, Ergocalciferol, (DRISDOL) 50000 units CAPS capsule  Type 2 diabetes mellitus without complication, without long-term current use of insulin (HCC)  S/P gastric surgery  Class 3 obesity with serious comorbidity and body mass index (BMI) of 45.0 to 49.9 in adult, unspecified obesity type (HCC)  PLAN:  Vitamin D Deficiency Meckenzie was informed that low vitamin D levels contributes to fatigue and are associated with obesity, breast, and colon cancer. She agrees to start to take prescription Vit D ,000 IU every week #4 with 1 refill and will follow up for routine testing of vitamin D, at least 2-3 times per year. She was informed of the risk of over-replacement of vitamin D and agrees to not increase her dose unless he discusses this with [place] first. Byrdie agrees to follow up with our clinic as needed.  Diabetes II Twisha has been given extensive  diabetes education by myself today including ideal fasting and post-prandial blood glucose readings, individual ideal Hgb A1c goals  and hypoglycemia prevention. We discussed the importance of good blood sugar control to decrease the likelihood of diabetic complications such as nephropathy, neuropathy, limb loss, blindness, coronary artery disease, and death. We discussed the importance of intensive lifestyle modification including diet, exercise and weight loss as the first line treatment for diabetes. Kaniesha agrees to continue with diet and exercise and will check BGs 2 times daily and bring log to her next visit. Wren agrees to follow up with our clinic as needed.   Obesity Delorice is currently in the action stage of change. As such, her goal is to continue with weight loss efforts She has agreed to keep a food journal with 1200 to 1400 calories and 80+ grams of protein daily Annalysse has been instructed to work up to a goal of 150 minutes of combined cardio and strengthening exercise per week for weight loss and overall health benefits. We discussed the following Behavioral Modification Strategies today: keep a strict food journal, increasing lean protein intake, decreasing simple carbohydrates  and increasing vegetables  Jasmin will start Back on Track group sessions Jaleah has agreed to follow up with our clinic as needed. She was informed of the importance of frequent follow up visits to maximize her success with intensive lifestyle modifications for her multiple health conditions.  I, [place] Crane, am acting as transcriptionist for Quillian Quince, MD  I have reviewed the above documentation for accuracy and completeness, and I agree with the above. -Quillian Quince, MD    OBESITY BEHAVIORAL INTERVENTION VISIT  Today's visit was # 2 out of 22.  Starting weight: 292 lbs Starting date: 02/18/17 Today's weight : 289 lbs Today's date: 02/25/2017 Total lbs lost to date: 3 (Patients must  lose 7 lbs in the first 6 months to continue with counseling)   ASK: We discussed the  diagnosis of obesity with Hedi P Rubis today and Huntley agreed to give [place] permission to discuss obesity behavioral modification therapy today.  ASSESS: Sharlyn has the diagnosis of obesity and her BMI today is 46.67 Syvilla is in the action stage of change   ADVISE: Desta was educated on the multiple health risks of obesity as well as the benefit of weight loss to improve her health. She was advised of the need for long term treatment and the importance of lifestyle modifications.  AGREE: Multiple dietary modification options and treatment options were discussed and  Ronda agreed to keep a food journal with 1200 to 1400 calories and 80+ grams of protein daily We discussed the following Behavioral Modification Strategies today: keep a strict food journal, increasing lean protein intake, decreasing simple carbohydrates  and increasing vegetables

## 2017-03-04 ENCOUNTER — Ambulatory Visit (INDEPENDENT_AMBULATORY_CARE_PROVIDER_SITE_OTHER): Payer: BLUE CROSS/BLUE SHIELD | Admitting: Dietician

## 2017-03-04 VITALS — Ht 66.0 in | Wt 289.0 lb

## 2017-03-04 DIAGNOSIS — R7303 Prediabetes: Secondary | ICD-10-CM | POA: Diagnosis not present

## 2017-03-04 DIAGNOSIS — Z9884 Bariatric surgery status: Secondary | ICD-10-CM | POA: Diagnosis not present

## 2017-03-04 DIAGNOSIS — Z6841 Body Mass Index (BMI) 40.0 and over, adult: Secondary | ICD-10-CM

## 2017-03-04 DIAGNOSIS — Z9189 Other specified personal risk factors, not elsewhere classified: Secondary | ICD-10-CM

## 2017-03-04 NOTE — Progress Notes (Signed)
5.6  

## 2017-03-06 DIAGNOSIS — Z1231 Encounter for screening mammogram for malignant neoplasm of breast: Secondary | ICD-10-CM | POA: Diagnosis not present

## 2017-03-06 LAB — HM MAMMOGRAPHY

## 2017-03-10 NOTE — Progress Notes (Signed)
  Office: [phone]  /  Fax: [phone]  OBESITY AND PREVENTATIVE COUNSELING BEHAVIORAL INTERVENTION VISIT  Today's one-on-one visit was # 3 out of 22, In addition she attended the group BOT# 1  Starting weight: 292 lbs Starting date: 02/18/17 Today's weight : Weight: 289 lb (131.1 kg)  Today's date: 03/04/17 Total lbs lost to date: 3 lbs (Patients must lose 7 lbs in the first 6 months to continue with counseling)  PREVENTATIVE COUNSELING: Eleshia is at high risk of developing multiple serious health conditions including uncontrolled diabetes, coronary artery disease, heart failure, sleep apnea, chronic pain, depression, obesity related cancers and more due to her weight. These risks have been discussed in depth and Aizlyn has agreed to work on the underlying disease of obesity to decrease the risk of developing any and all of these obesity related disease.  Brailyn  received intensive counseling today on specific behavioral modifications to get her back on track s/p her lap sleeve gastrectomy. She is keeping a food journal and states she continues to struggle with hunger. Although she had a weight loss of 3lbs this week she appears to be retaining some fluid and has +3/+4 pitting edema in her lower extremities. She is journaling  100% of the time and is meeting her calorie and protein goals of 1200-1400 calories and 80+ grams of protein.   ASK: We discussed the diagnosis of obesity with Yesha P Zaldivar today and Karrissa agreed to give [place] permission to discuss obesity behavioral modification therapy today.  ASSESS: Shauni has the diagnosis of obesity and her BMI today is 46.67 Hidaya is in the action stage of change   ADVISE: Kaiyah was educated on the multiple health risks of obesity as well as the benefit of weight loss to improve her health. She was advised of the need for long term treatment and the importance of lifestyle modifications.  AGREE: Multiple dietary modification  options and treatment options were discussed and  Tanashia agreed to keep a food journal with 1200-1400 calories and 80+ grams protein  We discussed the following Behavioral Modification Stratagies today: increasing lean protein intake, increasing vegetables and work on meal planning and easy cooking plans.  We spent > than 50% of the 15 minute visit on the counseling as documented in the note.

## 2017-03-11 ENCOUNTER — Ambulatory Visit (INDEPENDENT_AMBULATORY_CARE_PROVIDER_SITE_OTHER): Payer: BLUE CROSS/BLUE SHIELD | Admitting: Dietician

## 2017-03-11 ENCOUNTER — Ambulatory Visit (INDEPENDENT_AMBULATORY_CARE_PROVIDER_SITE_OTHER): Payer: BLUE CROSS/BLUE SHIELD | Admitting: Family Medicine

## 2017-03-11 VITALS — Ht 66.0 in | Wt 287.0 lb

## 2017-03-11 DIAGNOSIS — Z9189 Other specified personal risk factors, not elsewhere classified: Secondary | ICD-10-CM

## 2017-03-11 DIAGNOSIS — E119 Type 2 diabetes mellitus without complications: Secondary | ICD-10-CM

## 2017-03-11 DIAGNOSIS — Z9884 Bariatric surgery status: Secondary | ICD-10-CM | POA: Diagnosis not present

## 2017-03-11 DIAGNOSIS — Z6841 Body Mass Index (BMI) 40.0 and over, adult: Secondary | ICD-10-CM

## 2017-03-11 MED ORDER — METFORMIN HCL 500 MG PO TABS: 500.0000 mg | ORAL_TABLET | Freq: Two times a day (BID) | ORAL | 0 refills | Status: DC

## 2017-03-13 NOTE — Progress Notes (Signed)
  Office: [phone]  /  Fax: [phone]  OBESITY AND PREVENTATIVE COUNSELING BEHAVIORAL INTERVENTION VISIT  Today's visit was # 4 out of 22, BOT# 2  Starting weight: 292 lbs Starting date: 02/18/17 Today's weight : Weight: 287 lb (130.2 kg)  Today's date:03/11/17 Total lbs lost to date: 5 lbs (Patients must lose 7 lbs in the first 6 months to continue with counseling)  PREVENTATIVE COUNSELING: Myrian is at high risk of developing multiple serious health conditions including uncontrolled diabetes, coronary artery disease, heart failure, sleep apnea, chronic pain, depression, obesity related cancers and more due to her weight. These risks have been discussed in depth and Lailani has agreed to work on the underlying disease of obesity to decrease the risk of developing any and all of these obesity related disease.  Sofie  received intensive behavior modification counseling today on emotional eating. Detailed strategies for handling emotional eating was discussed and written informations was provided. Renise is s/p laparoscopic sleeve gastrectomy and is actively working on an obesity treatment plan. She is journaling 100% of the time and has done well meeting her protein and calorie goal however continues to struggle with hunger. Pt also referred to Dr. Dalbert Garnet this visit.   ASK: We discussed the diagnosis of obesity with Aloha P Eugenio today and Tashea agreed to give [place] permission to discuss obesity behavioral modification therapy today.  ASSESS: Ishana has the diagnosis of obesity and her BMI today is 46.35  Dosha is in the action stage of change   ADVISE: Azalya was educated on the multiple health risks of obesity as well as the benefit of weight loss to improve her health. She was advised of the need for long term treatment and the importance of lifestyle modifications.  AGREE: Multiple dietary modification options and treatment options were discussed and  Melynda agreed  to keep a food journal with 1200-1400 calories and 80+ grams protein  We discussed the following Behavioral Modification Stratagies today: increasing lean protein intake, decreasing simple carbohydrates , work on meal planning and easy cooking plans, emotional eating strategies, ways to avoid boredom eating, ways to avoid night time snacking and avoiding temptations  We spent > than 50% of the 15 minute visit on the counseling as documented in the note.

## 2017-03-18 ENCOUNTER — Encounter (INDEPENDENT_AMBULATORY_CARE_PROVIDER_SITE_OTHER): Payer: Self-pay | Admitting: Family Medicine

## 2017-03-18 ENCOUNTER — Ambulatory Visit (INDEPENDENT_AMBULATORY_CARE_PROVIDER_SITE_OTHER): Payer: BLUE CROSS/BLUE SHIELD | Admitting: Dietician

## 2017-03-18 VITALS — Ht 66.0 in | Wt 286.0 lb

## 2017-03-18 DIAGNOSIS — Z9189 Other specified personal risk factors, not elsewhere classified: Secondary | ICD-10-CM

## 2017-03-18 DIAGNOSIS — Z9884 Bariatric surgery status: Secondary | ICD-10-CM | POA: Diagnosis not present

## 2017-03-18 DIAGNOSIS — Z6841 Body Mass Index (BMI) 40.0 and over, adult: Secondary | ICD-10-CM | POA: Diagnosis not present

## 2017-03-18 NOTE — Progress Notes (Signed)
Office: [phone]  /  Fax: [phone]   HPI:   Chief Complaint: OBESITY Amanda Davidson is here to discuss her progress with her obesity treatment plan. She is on the keep a food journal with 1200 to 1400 calories and 80+ grams of protein daily and is following her eating plan approximately 100 % of the time. She states she is exercising 0 minutes 0 times per week. Amanda Davidson continues to lose weight and is following her plan. She is struggling with hunger. Her weight is 287 lbs today and has had a weight loss of 2 pounds over a period of 1 week since her last visit. She has lost 5 lbs since starting treatment with [place].  Diabetes II Amanda Davidson has a diagnosis of diabetes type II. Amanda Davidson states fasting BGs range between 100 and 150's and denies any hypoglycemic episodes. She is not currently on medications and she admits polyphagia. She has been working on intensive lifestyle modifications including diet, exercise, and weight loss to help control her blood glucose levels.  At risk for cardiovascular disease Amanda Davidson is at a higher than average risk for cardiovascular disease due to obesity and diabetes. She currently denies any chest pain.  ALLERGIES: No Known Allergies  MEDICATIONS: Current Outpatient Prescriptions on File Prior to Visit  Medication Sig Dispense Refill  . albuterol (PROVENTIL HFA;VENTOLIN HFA) 108 (90 Base) MCG/ACT inhaler Inhale 2 puffs into lungs every 6 hours as needed for wheezing 3 Inhaler 1  . clonazePAM (KLONOPIN) 0.5 MG tablet TAKE ONE TABLET BY MOUTH TWO TIMES DAILY AS NEEDED FOR ANXIETY 30 tablet 1  . levothyroxine (SYNTHROID, LEVOTHROID) 137 MCG tablet TAKE ONE TABLET BY MOUTH EVERY DAY BEFORE BREAKFAST 90 tablet 3  . montelukast (SINGULAIR) 10 MG tablet TAKE 1 TABLET (10 MG TOTAL) BY MOUTH AT BEDTIME. 90 tablet 4  . sertraline (ZOLOFT) 50 MG tablet Take 1 tablet (50 mg total) by mouth daily. 90 tablet 3  . SUMAtriptan (IMITREX) 100 MG tablet Take 1 tablet (100 mg  total) by mouth every 2 (two) hours as needed for migraine. Due for follow up visit 10 tablet 0  . Vitamin D, Ergocalciferol, (DRISDOL) 50000 units CAPS capsule Take 1 capsule (50,000 Units total) by mouth every 7 (seven) days. 4 capsule 1   No current facility-administered medications on file prior to visit.     PAST MEDICAL HISTORY: Past Medical History:  Diagnosis Date  . Anemia   . Asthma   . Complication of anesthesia    NAUSEA  . Depression   . Diabetes mellitus without complication (HCC)   . Drug use   . Exercise-induced asthma   . GERD (gastroesophageal reflux disease)   . Headache(784.0)    MIGRAINES  . Hypertension   . Hypothyroidism   . IBS (irritable bowel syndrome)   . Migraines   . Obesity   . Post-nasal drip   . Shortness of breath   . Sleep apnea    STOPPPED USING C-PAP 6 MO AGO  . Vitamin D deficiency     PAST SURGICAL HISTORY: Past Surgical History:  Procedure Laterality Date  . HERNIA REPAIR    . LAPAROSCOPIC GASTRIC SLEEVE RESECTION N/A 05/14/2013   Procedure: LAPAROSCOPIC GASTRIC SLEEVE RESECTION;  Surgeon: Valarie Merino, Amanda Davidson;  Location: [facility];  Service: General;  Laterality: N/A;  . UMBILICAL HERNIA REPAIR  05/14/2013   Procedure: HERNIA REPAIR UMBILICAL ADULT;  Surgeon: Valarie Merino, Amanda Davidson;  Location: [facility];  Service: General;;  . UPPER GI ENDOSCOPY  05/14/2013   Procedure: UPPER GI ENDOSCOPY;  Surgeon: Valarie Merino, Amanda Davidson;  Location: [facility];  Service: General;;    SOCIAL HISTORY: Social History  Substance Use Topics  . Smoking status: Never Smoker  . Smokeless tobacco: Never Used  . Alcohol use 0.5 oz/week    1 drink(s) per week     Comment: 2 glasses wine @ 2x/month    FAMILY HISTORY: Family History  Problem Relation Age of Onset  . Hypertension Mother   . Heart disease Mother   . Depression Mother   . Hypertension Father   . Diabetes Father   . Sleep apnea Father   . Heart disease Maternal Grandfather   . Heart attack  Maternal Grandfather   . Cancer Maternal Grandfather        bladder  . Hyperlipidemia Other   . Sleep apnea Other     ROS: Review of Systems  Constitutional: Positive for weight loss.  Cardiovascular: Negative for chest pain.  Endo/Heme/Allergies:       Positive polyphagia Negative hypoglycemia    PHYSICAL EXAM: There were no vitals taken for this visit. There is no height or weight on file to calculate BMI. Physical Exam  Constitutional: She is oriented to person, place, and time. She appears well-developed and well-nourished.  Cardiovascular: Normal rate.   Pulmonary/Chest: Effort normal.  Musculoskeletal: Normal range of motion.  Neurological: She is oriented to person, place, and time.  Skin: Skin is warm and dry.  Psychiatric: She has a normal mood and affect. Her behavior is normal.  Vitals reviewed.   RECENT LABS AND TESTS: BMET    Component Value Date/Time   NA 137 02/18/2017 1134   K 4.6 02/18/2017 1134   CL 100 02/18/2017 1134   CO2 24 02/18/2017 1134   GLUCOSE 92 02/18/2017 1134   GLUCOSE 94 07/02/2016 1602   BUN 13 02/18/2017 1134   CREATININE 0.65 02/18/2017 1134   CREATININE 1.00 07/02/2016 1602   CALCIUM 9.5 02/18/2017 1134   GFRNONAA 106 02/18/2017 1134   GFRNONAA 67 07/02/2016 1602   GFRAA 122 02/18/2017 1134   GFRAA 78 07/02/2016 1602   Lab Results  Component Value Date   HGBA1C 6.0 (H) 02/18/2017   HGBA1C 5.6 07/02/2016   HGBA1C 5.8 (H) 07/03/2015   HGBA1C 5.5 07/03/2015   HGBA1C 5.7 (H) 08/26/2014   Lab Results  Component Value Date   INSULIN 12.2 02/18/2017   CBC    Component Value Date/Time   WBC 9.6 02/18/2017 1134   WBC 8.7 07/03/2015 0941   WBC 8.7 07/03/2015 0941   RBC 4.73 02/18/2017 1134   RBC 4.75 07/03/2015 0941   RBC 4.75 07/03/2015 0941   HGB 13.3 02/18/2017 1134   HCT 40.7 02/18/2017 1134   PLT 349 07/03/2015 0941   PLT 349 07/03/2015 0941   MCV 86 02/18/2017 1134   MCH 28.1 02/18/2017 1134   MCH 29.7  07/03/2015 0941   MCH 29.7 07/03/2015 0941   MCHC 32.7 02/18/2017 1134   MCHC 34.1 07/03/2015 0941   MCHC 34.1 07/03/2015 0941   RDW 14.9 02/18/2017 1134   LYMPHSABS 2.5 02/18/2017 1134   MONOABS 0.6 07/03/2015 0941   EOSABS 0.1 02/18/2017 1134   BASOSABS 0.1 02/18/2017 1134   Iron/TIBC/Ferritin/ %Sat    Component Value Date/Time   FERRITIN 147 07/03/2015 0941   Lipid Panel     Component Value Date/Time   CHOL 186 02/18/2017 1134   TRIG 137 02/18/2017 1134   HDL  46 02/18/2017 1134   CHOLHDL 3.1 08/26/2014 1020   VLDL 21 08/26/2014 1020   LDLCALC 113 (H) 02/18/2017 1134   Hepatic Function Panel     Component Value Date/Time   PROT 6.5 02/18/2017 1134   ALBUMIN 3.9 02/18/2017 1134   AST 23 02/18/2017 1134   ALT 22 02/18/2017 1134   ALKPHOS 91 02/18/2017 1134   BILITOT 0.5 02/18/2017 1134      Component Value Date/Time   TSH 1.960 02/18/2017 1134   TSH 1.92 07/02/2016 1602   TSH 1.401 07/03/2015 0941    ASSESSMENT AND PLAN: Type 2 diabetes mellitus without complication, without long-term current use of insulin (HCC) - Plan: metFORMIN (GLUCOPHAGE) 500 MG tablet  At risk for heart disease  Class 3 severe obesity with serious comorbidity and body mass index (BMI) of 45.0 to 49.9 in adult, unspecified obesity type (HCC)  PLAN:  Diabetes II Amanda Davidson has been given extensive diabetes education by myself today including ideal fasting and post-prandial blood glucose readings, individual ideal Hgb A1c goals  and hypoglycemia prevention. We discussed the importance of good blood sugar control to decrease the likelihood of diabetic complications such as nephropathy, neuropathy, limb loss, blindness, coronary artery disease, and death. We discussed the importance of intensive lifestyle modification including diet, exercise and weight loss as the first line treatment for diabetes. She agrees to continue to work on diet, exercise and weight loss. Amanda Davidson agrees to start metformin  500 mg bid #60 with 1 refill and will follow up at the agreed upon time.  Cardiovascular risk counseling Amanda Davidson was given extended (15 minutes) coronary artery disease prevention counseling today. She is 46 y.o. female and has risk factors for heart disease including obesity and diabetes. We discussed intensive lifestyle modifications today with an emphasis on specific weight loss instructions and strategies. Pt was also informed of the importance of increasing exercise and decreasing saturated fats to help prevent heart disease.  Obesity Amanda Davidson is currently in the action stage of change. As such, her goal is to continue with weight loss efforts She has agreed to keep a food journal with 1200 to 1400 calories and 80+ grams of protein daily Amanda Davidson has been instructed to work up to a goal of 150 minutes of combined cardio and strengthening exercise per week for weight loss and overall health benefits. We discussed the following Behavioral Modification Strategies today: increasing lean protein intake, decreasing simple carbohydrates  and work on meal planning and easy cooking plans  Amanda Davidson has agreed to follow up with our clinic in 1 week. She was informed of the importance of frequent follow up visits to maximize her success with intensive lifestyle modifications for her multiple health conditions.    OBESITY BEHAVIORAL INTERVENTION VISIT  Today's visit was # 4 out of 22.  Starting weight: 292 lbs Starting date: 02/18/17 Today's weight : 287 lbs Today's date: 03/11/2017 Total lbs lost to date: 5 (Patients must lose 7 lbs in the first 6 months to continue with counseling)   ASK: We discussed the diagnosis of obesity with Amanda Davidson today and Amanda Davidson agreed to give [place] permission to discuss obesity behavioral modification therapy today.  ASSESS: Amaryah has the diagnosis of obesity and her BMI today is 46.35 Amanda Davidson is in the action stage of change   ADVISE: Amanda Davidson was  educated on the multiple health risks of obesity as well as the benefit of weight loss to improve her health. She was advised of the need for long  term treatment and the importance of lifestyle modifications.  AGREE: Multiple dietary modification options and treatment options were discussed and  Jameshia agreed to keep a food journal with 1200 to 1400 calories and 80+ grams of protein daily We discussed the following Behavioral Modification Strategies today: increasing lean protein intake, decreasing simple carbohydrates  and work on meal planning and easy cooking plans  Amanda Davidson, Amanda Davidson, am acting as transcriptionist for Amanda Quince, Amanda Davidson  Amanda Davidson have reviewed the above documentation for accuracy and completeness, and Amanda Davidson agree with the above. -Amanda Quince, Amanda Davidson
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
## 2017-03-21 ENCOUNTER — Encounter: Payer: Self-pay | Admitting: Physician Assistant

## 2017-03-25 ENCOUNTER — Encounter (INDEPENDENT_AMBULATORY_CARE_PROVIDER_SITE_OTHER): Payer: Self-pay | Admitting: Family Medicine

## 2017-03-25 ENCOUNTER — Ambulatory Visit (INDEPENDENT_AMBULATORY_CARE_PROVIDER_SITE_OTHER): Payer: Self-pay | Admitting: Dietician

## 2017-03-25 NOTE — Telephone Encounter (Signed)
Please cancel/sch

## 2017-03-27 ENCOUNTER — Encounter (INDEPENDENT_AMBULATORY_CARE_PROVIDER_SITE_OTHER): Payer: Self-pay

## 2017-03-31 NOTE — Telephone Encounter (Signed)
PATIENT HAS BEEN R/S SEE ATTACHED MY CHART MSGS.

## 2017-04-02 ENCOUNTER — Ambulatory Visit (INDEPENDENT_AMBULATORY_CARE_PROVIDER_SITE_OTHER): Payer: BLUE CROSS/BLUE SHIELD | Admitting: Family Medicine

## 2017-04-02 VITALS — BP 128/83 | HR 63 | Temp 98.0°F | Ht 66.0 in | Wt 285.0 lb

## 2017-04-02 DIAGNOSIS — Z6841 Body Mass Index (BMI) 40.0 and over, adult: Secondary | ICD-10-CM

## 2017-04-02 DIAGNOSIS — E119 Type 2 diabetes mellitus without complications: Secondary | ICD-10-CM

## 2017-04-02 DIAGNOSIS — E559 Vitamin D deficiency, unspecified: Secondary | ICD-10-CM

## 2017-04-02 DIAGNOSIS — E66813 Obesity, class 3: Secondary | ICD-10-CM

## 2017-04-02 MED ORDER — VITAMIN D (ERGOCALCIFEROL) 1.25 MG (50000 UNIT) PO CAPS: 50000.0000 [IU] | ORAL_CAPSULE | ORAL | 1 refills | Status: DC

## 2017-04-02 NOTE — Progress Notes (Signed)
Office: [phone]  /  Fax: [phone]   HPI:   Chief Complaint: OBESITY Amanda Davidson is here to discuss her progress with her obesity treatment plan. She is on the keep a food journal with 1200 to 1400 calories and 80+ grams of protein daily and is following her eating plan approximately 90 % of the time. She states she is exercising 0 minutes 0 times per week. Amanda Davidson is status post gastric sleeve and status post BOT program. Her weight is 285 lb (129.3 kg) today and has had a weight loss of 1 pound over a period of 3 weeks since her last visit. She has lost 7 lbs since starting treatment with Amanda Davidson.  Vitamin D deficiency Amanda Davidson has a diagnosis of vitamin D deficiency. She is currently stable on vit D, not yet at goal and denies nausea, vomiting or muscle weakness.  Diabetes II Amanda Davidson has a diagnosis of diabetes type II. Amanda Davidson is on metformin in the morning and notes Amanda Davidson GI upset initially but this is resolved. Amanda Davidson is only on 1 dose daily of metformin and she struggles with pm polyphagia. She denies any hypoglycemic episodes. She has been working on intensive lifestyle modifications including diet, exercise, and weight loss to help control her blood glucose levels.   ALLERGIES: No Known Allergies  MEDICATIONS: Current Outpatient Prescriptions on File Prior to Visit  Medication Sig Dispense Refill  . albuterol (PROVENTIL HFA;VENTOLIN HFA) 108 (90 Base) MCG/ACT inhaler Inhale 2 puffs into lungs every 6 hours as needed for wheezing 3 Inhaler 1  . clonazePAM (KLONOPIN) 0.5 MG tablet TAKE ONE TABLET BY MOUTH TWO TIMES DAILY AS NEEDED FOR ANXIETY 30 tablet 1  . levothyroxine (SYNTHROID, LEVOTHROID) 137 MCG tablet TAKE ONE TABLET BY MOUTH EVERY DAY BEFORE BREAKFAST 90 tablet 3  . metFORMIN (GLUCOPHAGE) 500 MG tablet Take 1 tablet (500 mg total) by mouth 2 (two) times daily with a meal. 60 tablet 0  . montelukast (SINGULAIR) 10 MG tablet TAKE 1 TABLET (10 MG TOTAL) BY MOUTH AT BEDTIME.  90 tablet 4  . sertraline (ZOLOFT) 50 MG tablet Take 1 tablet (50 mg total) by mouth daily. 90 tablet 3  . SUMAtriptan (IMITREX) 100 MG tablet Take 1 tablet (100 mg total) by mouth every 2 (two) hours as needed for migraine. Due for follow up visit 10 tablet 0   No current facility-administered medications on file prior to visit.     PAST MEDICAL HISTORY: Past Medical History:  Diagnosis Date  . Anemia   . Asthma   . Complication of anesthesia    NAUSEA  . Depression   . Diabetes mellitus without complication (HCC)   . Drug use   . Exercise-induced asthma   . GERD (gastroesophageal reflux disease)   . Headache(784.0)    MIGRAINES  . Hypertension   . Hypothyroidism   . IBS (irritable bowel syndrome)   . Migraines   . Obesity   . Post-nasal drip   . Shortness of breath   . Sleep apnea    STOPPPED USING C-PAP 6 MO AGO  . Vitamin D deficiency     PAST SURGICAL HISTORY: Past Surgical History:  Procedure Laterality Date  . HERNIA REPAIR    . LAPAROSCOPIC GASTRIC SLEEVE RESECTION N/A 05/14/2013   Procedure: LAPAROSCOPIC GASTRIC SLEEVE RESECTION;  Surgeon: Valarie Merino, MD;  Location: [facility];  Service: General;  Laterality: N/A;  . UMBILICAL HERNIA REPAIR  05/14/2013   Procedure: HERNIA REPAIR UMBILICAL ADULT;  Surgeon: Thornton Park  Daphine Deutscher, MD;  Location: [facility];  Service: General;;  . UPPER GI ENDOSCOPY  05/14/2013   Procedure: UPPER GI ENDOSCOPY;  Surgeon: Valarie Merino, MD;  Location: [facility];  Service: General;;    SOCIAL HISTORY: Social History  Substance Use Topics  . Smoking status: Never Smoker  . Smokeless tobacco: Never Used  . Alcohol use 0.5 oz/week    1 drink(s) per week     Comment: 2 glasses wine @ 2x/month    FAMILY HISTORY: Family History  Problem Relation Age of Onset  . Hypertension Mother   . Heart disease Mother   . Depression Mother   . Hypertension Father   . Diabetes Father   . Sleep apnea Father   . Heart disease Maternal Grandfather    . Heart attack Maternal Grandfather   . Cancer Maternal Grandfather        bladder  . Hyperlipidemia Other   . Sleep apnea Other     ROS: Review of Systems  Constitutional: Positive for weight loss.  Gastrointestinal: Negative for nausea and vomiting.  Musculoskeletal:       Negative muscle weakness  Endo/Heme/Allergies:       Positive polyphagia Negative hypoglycemia    PHYSICAL EXAM: Blood pressure 128/83, pulse 63, temperature 98 F (36.7 C), temperature source Oral, height 5\' 6"  (1.676 m), weight 285 lb (129.3 kg), last menstrual period 03/18/2017, SpO2 99 %. Body mass index is 46 kg/m. Physical Exam  Constitutional: She is oriented to person, place, and time. She appears well-developed and well-nourished.  Cardiovascular: Normal rate.   Pulmonary/Chest: Effort normal.  Musculoskeletal: Normal range of motion.  Neurological: She is oriented to person, place, and time.  Skin: Skin is warm and dry.  Psychiatric: She has a normal mood and affect. Her behavior is normal.  Vitals reviewed.   RECENT LABS AND TESTS: BMET    Component Value Date/Time   NA 137 02/18/2017 1134   K 4.6 02/18/2017 1134   CL 100 02/18/2017 1134   CO2 24 02/18/2017 1134   GLUCOSE 92 02/18/2017 1134   GLUCOSE 94 07/02/2016 1602   BUN 13 02/18/2017 1134   CREATININE 0.65 02/18/2017 1134   CREATININE 1.00 07/02/2016 1602   CALCIUM 9.5 02/18/2017 1134   GFRNONAA 106 02/18/2017 1134   GFRNONAA 67 07/02/2016 1602   GFRAA 122 02/18/2017 1134   GFRAA 78 07/02/2016 1602   Lab Results  Component Value Date   HGBA1C 6.0 (H) 02/18/2017   HGBA1C 5.6 07/02/2016   HGBA1C 5.8 (H) 07/03/2015   HGBA1C 5.5 07/03/2015   HGBA1C 5.7 (H) 08/26/2014   Lab Results  Component Value Date   INSULIN 12.2 02/18/2017   CBC    Component Value Date/Time   WBC 9.6 02/18/2017 1134   WBC 8.7 07/03/2015 0941   WBC 8.7 07/03/2015 0941   RBC 4.73 02/18/2017 1134   RBC 4.75 07/03/2015 0941   RBC 4.75  07/03/2015 0941   HGB 13.3 02/18/2017 1134   HCT 40.7 02/18/2017 1134   PLT 349 07/03/2015 0941   PLT 349 07/03/2015 0941   MCV 86 02/18/2017 1134   MCH 28.1 02/18/2017 1134   MCH 29.7 07/03/2015 0941   MCH 29.7 07/03/2015 0941   MCHC 32.7 02/18/2017 1134   MCHC 34.1 07/03/2015 0941   MCHC 34.1 07/03/2015 0941   RDW 14.9 02/18/2017 1134   LYMPHSABS 2.5 02/18/2017 1134   MONOABS 0.6 07/03/2015 0941   EOSABS 0.1 02/18/2017 1134   BASOSABS 0.1  02/18/2017 1134   Iron/TIBC/Ferritin/ %Sat    Component Value Date/Time   FERRITIN 147 07/03/2015 0941   Lipid Panel     Component Value Date/Time   CHOL 186 02/18/2017 1134   TRIG 137 02/18/2017 1134   HDL 46 02/18/2017 1134   CHOLHDL 3.1 08/26/2014 1020   VLDL 21 08/26/2014 1020   LDLCALC 113 (H) 02/18/2017 1134   Hepatic Function Panel     Component Value Date/Time   PROT 6.5 02/18/2017 1134   ALBUMIN 3.9 02/18/2017 1134   AST 23 02/18/2017 1134   ALT 22 02/18/2017 1134   ALKPHOS 91 02/18/2017 1134   BILITOT 0.5 02/18/2017 1134      Component Value Date/Time   TSH 1.960 02/18/2017 1134   TSH 1.92 07/02/2016 1602   TSH 1.401 07/03/2015 0941    ASSESSMENT AND PLAN: Type 2 diabetes mellitus without complication, without long-term current use of insulin (HCC)  Vitamin D deficiency - Plan: Vitamin D, Ergocalciferol, (DRISDOL) 50000 units CAPS capsule  Class 3 severe obesity with serious comorbidity and body mass index (BMI) of 45.0 to 49.9 in adult, unspecified obesity type (HCC)  PLAN:  Vitamin D Deficiency Amanda Davidson was informed that low vitamin D levels contributes to fatigue and are associated with obesity, breast, and colon cancer. She agrees to continue to take prescription Vit D @50 ,000 IU every week #4 with no refills and will follow up for routine testing of vitamin D, at least 2-3 times per year. She was informed of the risk of over-replacement of vitamin D and agrees to not increase her dose unless he discusses  this with Amanda Davidson first. Amanda Davidson agrees to follow up with our clinic in 2 to 3 weeks.  Diabetes II Amanda Davidson, Amanda ideal Hgb A1c goals  and hypoglycemia prevention. We discussed the importance of good blood sugar control to decrease the likelihood of diabetic complications such as nephropathy, neuropathy, limb loss, blindness, coronary artery disease, and death. We discussed the importance of intensive lifestyle modification including diet, exercise and weight loss as the first line treatment for diabetes. Amanda Davidson was advised to change metformin dose to lunchtime for later day coverage. She agrees to follow up at the agreed upon time.  Obesity Amanda Davidson is currently in the action stage of change. As such, her goal is to continue with weight loss efforts She has agreed to keep a food journal with 1200 to 1400 calories and 80+ grams of protein daily Amanda Davidson has been instructed to work up to a goal of 150 minutes of combined cardio and strengthening exercise per week for weight loss and overall health benefits. We discussed the following Behavioral Modification Strategies today: increasing lean protein intake, decreasing simple carbohydrates , decrease eating out, work on meal planning and easy cooking plans and halloween holiday eating strategies   Denica has agreed to follow up with our clinic in 2 to 3 weeks. She was informed of the importance of frequent follow up visits to maximize her success with intensive lifestyle modifications for her multiple health conditions.  I, Nevada Crane, am acting as transcriptionist for Quillian Quince, MD  I have reviewed the above documentation for accuracy and completeness, and I agree with the above. -Quillian Quince, MD   OBESITY BEHAVIORAL INTERVENTION VISIT  Today's visit was # 4 out of 22.  Starting weight: 292 lbs Starting date:  02/18/17 Today's weight : 285 lbs Today's date:  04/02/2017 Total lbs lost to date: 7 (Patients must lose 7 lbs in the first 6 months to continue with counseling)   ASK: We discussed the diagnosis of obesity with Patrena P Iannaccone today and Tu agreed to give us permission to discuss obesity behavioral modification therapy today.  ASSESS: Efraim KaufmannMelissa has the diagnosis of obesity and her BMI today is 46.02 Irem is in the action stage of change   ADVISE: Efraim KaufmannMelissa was educated on the multiple health risks of obesity as well as the benefit of weight loss to improve her health. She was advised of the need for long term treatment and the importance of lifestyle modifications.  AGREE: Multiple dietary modification options and treatment options were discussed and  Donyetta agreed to keep a food journal with 1200 to 1400 calories and 80+ grams of protein daily We discussed the following Behavioral Modification Strategies today: increasing lean protein intake, decreasing simple carbohydrates , decrease eating out, work on meal planning and easy cooking plans and halloween holiday eating strategies

## 2017-04-07 ENCOUNTER — Other Ambulatory Visit (INDEPENDENT_AMBULATORY_CARE_PROVIDER_SITE_OTHER): Payer: Self-pay | Admitting: Family Medicine

## 2017-04-07 DIAGNOSIS — E119 Type 2 diabetes mellitus without complications: Secondary | ICD-10-CM

## 2017-04-15 ENCOUNTER — Other Ambulatory Visit (INDEPENDENT_AMBULATORY_CARE_PROVIDER_SITE_OTHER): Payer: Self-pay | Admitting: Family Medicine

## 2017-04-15 DIAGNOSIS — E119 Type 2 diabetes mellitus without complications: Secondary | ICD-10-CM

## 2017-04-16 ENCOUNTER — Encounter (INDEPENDENT_AMBULATORY_CARE_PROVIDER_SITE_OTHER): Payer: Self-pay

## 2017-04-16 ENCOUNTER — Ambulatory Visit (INDEPENDENT_AMBULATORY_CARE_PROVIDER_SITE_OTHER): Payer: BLUE CROSS/BLUE SHIELD | Admitting: Physician Assistant

## 2017-05-01 ENCOUNTER — Ambulatory Visit (INDEPENDENT_AMBULATORY_CARE_PROVIDER_SITE_OTHER): Payer: BLUE CROSS/BLUE SHIELD | Admitting: Physician Assistant

## 2017-05-01 VITALS — BP 133/83 | HR 78 | Temp 97.9°F | Ht 66.0 in | Wt 290.0 lb

## 2017-05-01 DIAGNOSIS — E559 Vitamin D deficiency, unspecified: Secondary | ICD-10-CM

## 2017-05-01 DIAGNOSIS — G4733 Obstructive sleep apnea (adult) (pediatric): Secondary | ICD-10-CM

## 2017-05-01 DIAGNOSIS — E119 Type 2 diabetes mellitus without complications: Secondary | ICD-10-CM | POA: Diagnosis not present

## 2017-05-01 DIAGNOSIS — Z6841 Body Mass Index (BMI) 40.0 and over, adult: Secondary | ICD-10-CM | POA: Diagnosis not present

## 2017-05-01 MED ORDER — VITAMIN D (ERGOCALCIFEROL) 1.25 MG (50000 UNIT) PO CAPS: 50000.0000 [IU] | ORAL_CAPSULE | ORAL | 1 refills | Status: DC

## 2017-05-01 MED ORDER — METFORMIN HCL 500 MG PO TABS: 500.0000 mg | ORAL_TABLET | Freq: Two times a day (BID) | ORAL | 0 refills | Status: DC

## 2017-05-01 NOTE — Progress Notes (Signed)
Office: [phone]  /  Fax: [phone]   HPI:   Chief Complaint: OBESITY Amanda Davidson is here to discuss her progress with her obesity treatment plan. She is on the keep a food journal with 1200 to 1400 calories and 80+ grams of protein daily and is following her eating plan approximately 45 % of the time. She states she is exercising 0 minutes 0 times per week. Amanda Davidson had increased emotional eating and has not been mindful of her eating. She would like healthier snack options. Her weight is 290 lb (131.5 kg) today and has had a weight gain of 5 pounds over a period of 4 weeks since her last visit. She has lost 2 lbs since starting treatment with us.  Diabetes II Amanda Davidson has a diagnosis of diabetes type II. Amanda Davidson is not checking BGs at home and admits to hypoglycemia, but she did not check her blood sugar. She has been working on intensive lifestyle modifications including diet, exercise, and weight loss to help control her blood glucose levels.  Vitamin D deficiency Amanda Davidson has a diagnosis of vitamin D deficiency. She is currently taking vit D and denies nausea, vomiting or muscle weakness.  Obstructive Sleep Apnea Amanda Davidson uses CPAP at home, but states she has not been getting much rest from CPAP and wants to be re-evaluated. Her last sleep study was 10 years ago.  ALLERGIES: No Known Allergies  MEDICATIONS: Current Outpatient Medications on File Prior to Visit  Medication Sig Dispense Refill  . albuterol (PROVENTIL HFA;VENTOLIN HFA) 108 (90 Base) MCG/ACT inhaler Inhale 2 puffs into lungs every 6 hours as needed for wheezing 3 Inhaler 1  . clonazePAM (KLONOPIN) 0.5 MG tablet TAKE ONE TABLET BY MOUTH TWO TIMES DAILY AS NEEDED FOR ANXIETY 30 tablet 1  . levothyroxine (SYNTHROID, LEVOTHROID) 137 MCG tablet TAKE ONE TABLET BY MOUTH EVERY DAY BEFORE BREAKFAST 90 tablet 3  . montelukast (SINGULAIR) 10 MG tablet TAKE 1 TABLET (10 MG TOTAL) BY MOUTH AT BEDTIME. 90 tablet 4  . sertraline  (ZOLOFT) 50 MG tablet Take 1 tablet (50 mg total) by mouth daily. 90 tablet 3  . SUMAtriptan (IMITREX) 100 MG tablet Take 1 tablet (100 mg total) by mouth every 2 (two) hours as needed for migraine. Due for follow up visit 10 tablet 0   No current facility-administered medications on file prior to visit.     PAST MEDICAL HISTORY: Past Medical History:  Diagnosis Date  . Anemia   . Asthma   . Complication of anesthesia    NAUSEA  . Depression   . Diabetes mellitus without complication (HCC)   . Drug use   . Exercise-induced asthma   . GERD (gastroesophageal reflux disease)   . Headache(784.0)    MIGRAINES  . Hypertension   . Hypothyroidism   . IBS (irritable bowel syndrome)   . Migraines   . Obesity   . Post-nasal drip   . Shortness of breath   . Sleep apnea    STOPPPED USING C-PAP 6 MO AGO  . Vitamin D deficiency     PAST SURGICAL HISTORY: Past Surgical History:  Procedure Laterality Date  . HERNIA REPAIR    . LAPAROSCOPIC GASTRIC SLEEVE RESECTION N/A 05/14/2013   Procedure: LAPAROSCOPIC GASTRIC SLEEVE RESECTION;  Surgeon: Valarie MerinoMatthew B Martin, MD;  Location: [facility];  Service: General;  Laterality: N/A;  . UMBILICAL HERNIA REPAIR  05/14/2013   Procedure: HERNIA REPAIR UMBILICAL ADULT;  Surgeon: Valarie MerinoMatthew B Martin, MD;  Location: [facility];  Service:  General;;  . UPPER GI ENDOSCOPY  05/14/2013   Procedure: UPPER GI ENDOSCOPY;  Surgeon: Valarie Merino, MD;  Location: [facility];  Service: General;;    SOCIAL HISTORY: Social History   Tobacco Use  . Smoking status: Never Smoker  . Smokeless tobacco: Never Used  Substance Use Topics  . Alcohol use: Yes    Alcohol/week: 0.5 oz    Types: 1 drink(s) per week    Comment: 2 glasses wine @ 2x/month  . Drug use: No    FAMILY HISTORY: Family History  Problem Relation Age of Onset  . Hypertension Mother   . Heart disease Mother   . Depression Mother   . Hypertension Father   . Diabetes Father   . Sleep apnea Father   .  Heart disease Maternal Grandfather   . Heart attack Maternal Grandfather   . Cancer Maternal Grandfather        bladder  . Hyperlipidemia Other   . Sleep apnea Other     ROS: Review of Systems  Constitutional: Negative for weight loss.  Gastrointestinal: Negative for nausea and vomiting.  Musculoskeletal:       Negative muscle weakness    PHYSICAL EXAM: Blood pressure 133/83, pulse 78, temperature 97.9 F (36.6 C), temperature source Oral, height 5\' 6"  (1.676 m), weight 290 lb (131.5 kg), last menstrual period 03/31/2017, SpO2 97 %. Body mass index is 46.81 kg/m. Physical Exam  Constitutional: She is oriented to person, place, and time. She appears well-developed and well-nourished.  Cardiovascular: Normal rate.  Pulmonary/Chest: Effort normal.  Musculoskeletal: Normal range of motion.  Neurological: She is oriented to person, place, and time.  Skin: Skin is warm and dry.  Psychiatric: She has a normal mood and affect. Her behavior is normal.  Vitals reviewed.   RECENT LABS AND TESTS: BMET    Component Value Date/Time   NA 137 02/18/2017 1134   K 4.6 02/18/2017 1134   CL 100 02/18/2017 1134   CO2 24 02/18/2017 1134   GLUCOSE 92 02/18/2017 1134   GLUCOSE 94 07/02/2016 1602   BUN 13 02/18/2017 1134   CREATININE 0.65 02/18/2017 1134   CREATININE 1.00 07/02/2016 1602   CALCIUM 9.5 02/18/2017 1134   GFRNONAA 106 02/18/2017 1134   GFRNONAA 67 07/02/2016 1602   GFRAA 122 02/18/2017 1134   GFRAA 78 07/02/2016 1602   Lab Results  Component Value Date   HGBA1C 6.0 (H) 02/18/2017   HGBA1C 5.6 07/02/2016   HGBA1C 5.8 (H) 07/03/2015   HGBA1C 5.5 07/03/2015   HGBA1C 5.7 (H) 08/26/2014   Lab Results  Component Value Date   INSULIN 12.2 02/18/2017   CBC    Component Value Date/Time   WBC 9.6 02/18/2017 1134   WBC 8.7 07/03/2015 0941   WBC 8.7 07/03/2015 0941   RBC 4.73 02/18/2017 1134   RBC 4.75 07/03/2015 0941   RBC 4.75 07/03/2015 0941   HGB 13.3 02/18/2017  1134   HCT 40.7 02/18/2017 1134   PLT 349 07/03/2015 0941   PLT 349 07/03/2015 0941   MCV 86 02/18/2017 1134   MCH 28.1 02/18/2017 1134   MCH 29.7 07/03/2015 0941   MCH 29.7 07/03/2015 0941   MCHC 32.7 02/18/2017 1134   MCHC 34.1 07/03/2015 0941   MCHC 34.1 07/03/2015 0941   RDW 14.9 02/18/2017 1134   LYMPHSABS 2.5 02/18/2017 1134   MONOABS 0.6 07/03/2015 0941   EOSABS 0.1 02/18/2017 1134   BASOSABS 0.1 02/18/2017 1134   Iron/TIBC/Ferritin/ %Sat  Component Value Date/Time   FERRITIN 147 07/03/2015 0941   Lipid Panel     Component Value Date/Time   CHOL 186 02/18/2017 1134   TRIG 137 02/18/2017 1134   HDL 46 02/18/2017 1134   CHOLHDL 3.1 08/26/2014 1020   VLDL 21 08/26/2014 1020   LDLCALC 113 (H) 02/18/2017 1134   Hepatic Function Panel     Component Value Date/Time   PROT 6.5 02/18/2017 1134   ALBUMIN 3.9 02/18/2017 1134   AST 23 02/18/2017 1134   ALT 22 02/18/2017 1134   ALKPHOS 91 02/18/2017 1134   BILITOT 0.5 02/18/2017 1134      Component Value Date/Time   TSH 1.960 02/18/2017 1134   TSH 1.92 07/02/2016 1602   TSH 1.401 07/03/2015 0941    ASSESSMENT AND PLAN: Controlled type 2 diabetes mellitus without complication, without long-term current use of insulin (HCC) - Plan: metFORMIN (GLUCOPHAGE) 500 MG tablet  Vitamin D deficiency - Plan: Vitamin D, Ergocalciferol, (DRISDOL) 50000 units CAPS capsule  OSA (obstructive sleep apnea) - Plan: Nocturnal polysomnography (NPSG)  Class 3 severe obesity with serious comorbidity and body mass index (BMI) of 45.0 to 49.9 in adult, unspecified obesity type (HCC)  PLAN:  Diabetes II Amanda Davidson has been given extensive diabetes education by myself today including ideal fasting and post-prandial blood glucose readings, individual ideal Hgb A1c goals  and hypoglycemia prevention. We discussed the importance of good blood sugar control to decrease the likelihood of diabetic complications such as nephropathy, neuropathy,  limb loss, blindness, coronary artery disease, and death. We discussed the importance of intensive lifestyle modification including diet, exercise and weight loss as the first line treatment for diabetes. Zaray agrees to continue metformin 500 mg bid #60 with no refills and will follow up at the agreed upon time.  Vitamin D Deficiency Amanda Davidson was informed that low vitamin D levels contributes to fatigue and are associated with obesity, breast, and colon cancer. She agrees to continue to take prescription Vit D @50 ,000 IU every week #4 with no refills and will follow up for routine testing of vitamin D, at least 2-3 times per year. She was informed of the risk of over-replacement of vitamin D and agrees to not increase her dose unless he discusses this with [place] first. Amanda Davidson agrees to follow up with our clinic in 2 weeks.  Obstructive Sleep Apnea We will send for sleep study and Amanda Davidson will follow up with our clinic in 2 weeks.  Obesity Amanda Davidson is currently in the action stage of change. As such, her goal is to continue with weight loss efforts She has agreed to follow the Category 2 plan Amanda Davidson has been instructed to work up to a goal of 150 minutes of combined cardio and strengthening exercise per week for weight loss and overall health benefits. We discussed the following Behavioral Modification Strategies today: increasing lean protein intake and work on meal planning and easy cooking plans  Amanda Davidson has agreed to follow up with our clinic in 2 weeks. She was informed of the importance of frequent follow up visits to maximize her success with intensive lifestyle modifications for her multiple health conditions.  I, [place] Crane, am acting as transcriptionist for Illa Level, PA-C  I have reviewed the above documentation for accuracy and completeness, and I agree with the above. -Illa Level, PA-C  I have reviewed the above note and agree with the plan. -Quillian Quince, MD   OBESITY  BEHAVIORAL INTERVENTION VISIT  Today's visit was # 5 out  of 22.  Starting weight: 292 lbs Starting date: 02/18/17 Today's weight : 290 lbs Today's date: 05/05/2017 Total lbs lost to date: 2 (Patients must lose 7 lbs in the first 6 months to continue with counseling)   ASK: We discussed the diagnosis of obesity with Amanda Davidson today and Amanda Davidson agreed to give us permission to discuss obesity behavioral modification therapy today.  ASSESS: Amanda Davidson has the diagnosis of obesity and her BMI today is 46.83 Amanda Davidson is in the action stage of change   ADVISE: Amanda Davidson was educated on the multiple health risks of obesity as well as the benefit of weight loss to improve her health. She was advised of the need for long term treatment and the importance of lifestyle modifications.  AGREE: Multiple dietary modification options and treatment options were discussed and  Amanda Davidson agreed to follow the Category 2 plan We discussed the following Behavioral Modification Strategies today: increasing lean protein intake and work on meal planning and easy cooking plans

## 2017-05-14 ENCOUNTER — Ambulatory Visit (INDEPENDENT_AMBULATORY_CARE_PROVIDER_SITE_OTHER): Payer: BLUE CROSS/BLUE SHIELD | Admitting: Physician Assistant

## 2017-05-19 ENCOUNTER — Other Ambulatory Visit: Payer: Self-pay | Admitting: Physician Assistant

## 2017-05-19 ENCOUNTER — Ambulatory Visit (INDEPENDENT_AMBULATORY_CARE_PROVIDER_SITE_OTHER): Payer: BLUE CROSS/BLUE SHIELD | Admitting: Physician Assistant

## 2017-05-19 VITALS — BP 135/82 | HR 72 | Temp 97.7°F | Ht 66.0 in | Wt 293.0 lb

## 2017-05-19 DIAGNOSIS — E559 Vitamin D deficiency, unspecified: Secondary | ICD-10-CM | POA: Diagnosis not present

## 2017-05-19 DIAGNOSIS — Z6841 Body Mass Index (BMI) 40.0 and over, adult: Secondary | ICD-10-CM

## 2017-05-19 DIAGNOSIS — G4733 Obstructive sleep apnea (adult) (pediatric): Secondary | ICD-10-CM

## 2017-05-19 DIAGNOSIS — F3289 Other specified depressive episodes: Secondary | ICD-10-CM

## 2017-05-19 DIAGNOSIS — Z9989 Dependence on other enabling machines and devices: Secondary | ICD-10-CM

## 2017-05-19 DIAGNOSIS — E119 Type 2 diabetes mellitus without complications: Secondary | ICD-10-CM

## 2017-05-19 MED ORDER — VITAMIN D (ERGOCALCIFEROL) 1.25 MG (50000 UNIT) PO CAPS: 50000.0000 [IU] | ORAL_CAPSULE | ORAL | 1 refills | Status: DC

## 2017-05-19 MED ORDER — METFORMIN HCL 500 MG PO TABS: 500.0000 mg | ORAL_TABLET | Freq: Two times a day (BID) | ORAL | 0 refills | Status: DC

## 2017-05-19 MED ORDER — BUPROPION HCL ER (SR) 150 MG PO TB12: 150.0000 mg | ORAL_TABLET | Freq: Every day | ORAL | 0 refills | Status: DC

## 2017-05-19 NOTE — Progress Notes (Signed)
Office: [phone]  /  Fax: [phone]   HPI:   Chief Complaint: OBESITY Amanda Davidson is here to discuss Amanda Davidson progress with Amanda Davidson obesity treatment plan. She is on the Category 2 plan and is following Amanda Davidson eating plan approximately 50 % of the time. She states she is exercising 0 minutes 0 times per week. Amanda Davidson has had increased emotional eating and states she has had an increase in cravings. She would like to discuss medication options for cravings. Amanda Davidson would like more variety with Amanda Davidson meals. Amanda Davidson weight is 293 lb (132.9 kg) today and has had a weight gain of 3 pounds over a period of 2 weeks since Amanda Davidson last visit. She has gained 1 lb since starting treatment with us.  Vitamin D deficiency Jodine has a diagnosis of vitamin D deficiency. She is currently taking vit D and denies nausea, vomiting or muscle weakness.  Diabetes II non insulin without complications Amanda Davidson has a diagnosis of diabetes type II. Amanda Davidson states fasting BGs range in the 120's and admits to hypoglycemia. She has been working on intensive lifestyle modifications including diet, exercise, and weight loss to help control Amanda Davidson blood glucose levels.  Obstructive Sleep Apnea Wilmoth is on CPAP and states she wakes up many times in the middle of the night and wakes up un-rested. Also, she states the CPAP machine does not fit well on Amanda Davidson face.  Amanda Davidson last sleep study was 4 years ago.  Depression with emotional eating behaviors Amanda Davidson is struggling with emotional eating and using food for comfort to the extent that it is negatively impacting Amanda Davidson health. She often snacks when she is not hungry. Amanda Davidson sometimes feels she is out of control and then feels guilty that she made poor food choices. She has been working on behavior modification techniques to help reduce Amanda Davidson emotional eating and has been somewhat successful. Amanda Davidson mood is stable and she shows no sign of suicidal or homicidal ideations.  Depression screen [facility] 2/9 02/18/2017  09/19/2016  Decreased Interest 2 0  Down, Depressed, Hopeless 2 0  PHQ - 2 Score 4 0  Altered sleeping 3 -  Tired, decreased energy 3 -  Change in appetite 2 -  Feeling bad or failure about yourself  3 -  Trouble concentrating 3 -  Moving slowly or fidgety/restless 2 -  Suicidal thoughts 0 -  PHQ-9 Score 20 -     ALLERGIES: No Known Allergies  MEDICATIONS: Current Outpatient Medications on File Prior to Visit  Medication Sig Dispense Refill  . albuterol (PROVENTIL HFA;VENTOLIN HFA) 108 (90 Base) MCG/ACT inhaler Inhale 2 puffs into lungs every 6 hours as needed for wheezing 3 Inhaler 1  . clonazePAM (KLONOPIN) 0.5 MG tablet TAKE ONE TABLET BY MOUTH TWO TIMES DAILY AS NEEDED FOR ANXIETY 30 tablet 1  . levothyroxine (SYNTHROID, LEVOTHROID) 137 MCG tablet TAKE ONE TABLET BY MOUTH EVERY DAY BEFORE BREAKFAST 90 tablet 3  . montelukast (SINGULAIR) 10 MG tablet TAKE 1 TABLET (10 MG TOTAL) BY MOUTH AT BEDTIME. 90 tablet 4  . sertraline (ZOLOFT) 50 MG tablet Take 1 tablet (50 mg total) by mouth daily. 90 tablet 3  . SUMAtriptan (IMITREX) 100 MG tablet Take 1 tablet (100 mg total) by mouth every 2 (two) hours as needed for migraine. Due for follow up visit 10 tablet 0   No current facility-administered medications on file prior to visit.     PAST MEDICAL HISTORY: Past Medical History:  Diagnosis Date  . Anemia   . Asthma   .  Complication of anesthesia    NAUSEA  . Depression   . Diabetes mellitus without complication (HCC)   . Drug use   . Exercise-induced asthma   . GERD (gastroesophageal reflux disease)   . Headache(784.0)    MIGRAINES  . Hypertension   . Hypothyroidism   . IBS (irritable bowel syndrome)   . Migraines   . Obesity   . Post-nasal drip   . Shortness of breath   . Sleep apnea    STOPPPED USING C-PAP 6 MO AGO  . Vitamin D deficiency     PAST SURGICAL HISTORY: Past Surgical History:  Procedure Laterality Date  . HERNIA REPAIR    . LAPAROSCOPIC GASTRIC  SLEEVE RESECTION N/A 05/14/2013   Procedure: LAPAROSCOPIC GASTRIC SLEEVE RESECTION;  Surgeon: Valarie MerinoMatthew B Martin, MD;  Location: [facility];  Service: General;  Laterality: N/A;  . UMBILICAL HERNIA REPAIR  05/14/2013   Procedure: HERNIA REPAIR UMBILICAL ADULT;  Surgeon: Valarie MerinoMatthew B Martin, MD;  Location: [facility];  Service: General;;  . UPPER GI ENDOSCOPY  05/14/2013   Procedure: UPPER GI ENDOSCOPY;  Surgeon: Valarie MerinoMatthew B Martin, MD;  Location: [facility];  Service: General;;    SOCIAL HISTORY: Social History   Tobacco Use  . Smoking status: Never Smoker  . Smokeless tobacco: Never Used  Substance Use Topics  . Alcohol use: Yes    Alcohol/week: 0.5 oz    Types: 1 drink(s) per week    Comment: 2 glasses wine @ 2x/month  . Drug use: No    FAMILY HISTORY: Family History  Problem Relation Age of Onset  . Hypertension Mother   . Heart disease Mother   . Depression Mother   . Hypertension Father   . Diabetes Father   . Sleep apnea Father   . Heart disease Maternal Grandfather   . Heart attack Maternal Grandfather   . Cancer Maternal Grandfather        bladder  . Hyperlipidemia Other   . Sleep apnea Other     ROS: Review of Systems  Constitutional: Positive for malaise/fatigue. Negative for weight loss.  Gastrointestinal: Negative for nausea and vomiting.  Musculoskeletal:       Negative muscle weakness  Endo/Heme/Allergies:       Positive hypoglycemia  Psychiatric/Behavioral: Positive for depression. Negative for suicidal ideas.    PHYSICAL EXAM: Blood pressure 135/82, pulse 72, temperature 97.7 F (36.5 C), temperature source Oral, height 5\' 6"  (1.676 m), weight 293 lb (132.9 kg), last menstrual period 05/11/2017, SpO2 98 %. Body mass index is 47.29 kg/m. Physical Exam  Constitutional: She is oriented to person, place, and time. She appears well-developed and well-nourished.  Cardiovascular: Normal rate.  Pulmonary/Chest: Effort normal.  Musculoskeletal: Normal range of motion.    Neurological: She is oriented to person, place, and time.  Skin: Skin is warm and dry.  Psychiatric: She has a normal mood and affect. Amanda Davidson behavior is normal.  Vitals reviewed.   RECENT LABS AND TESTS: BMET    Component Value Date/Time   NA 137 02/18/2017 1134   K 4.6 02/18/2017 1134   CL 100 02/18/2017 1134   CO2 24 02/18/2017 1134   GLUCOSE 92 02/18/2017 1134   GLUCOSE 94 07/02/2016 1602   BUN 13 02/18/2017 1134   CREATININE 0.65 02/18/2017 1134   CREATININE 1.00 07/02/2016 1602   CALCIUM 9.5 02/18/2017 1134   GFRNONAA 106 02/18/2017 1134   GFRNONAA 67 07/02/2016 1602   GFRAA 122 02/18/2017 1134   GFRAA 78 07/02/2016 1602  Lab Results  Component Value Date   HGBA1C 6.0 (H) 02/18/2017   HGBA1C 5.6 07/02/2016   HGBA1C 5.8 (H) 07/03/2015   HGBA1C 5.5 07/03/2015   HGBA1C 5.7 (H) 08/26/2014   Lab Results  Component Value Date   INSULIN 12.2 02/18/2017   CBC    Component Value Date/Time   WBC 9.6 02/18/2017 1134   WBC 8.7 07/03/2015 0941   WBC 8.7 07/03/2015 0941   RBC 4.73 02/18/2017 1134   RBC 4.75 07/03/2015 0941   RBC 4.75 07/03/2015 0941   HGB 13.3 02/18/2017 1134   HCT 40.7 02/18/2017 1134   PLT 349 07/03/2015 0941   PLT 349 07/03/2015 0941   MCV 86 02/18/2017 1134   MCH 28.1 02/18/2017 1134   MCH 29.7 07/03/2015 0941   MCH 29.7 07/03/2015 0941   MCHC 32.7 02/18/2017 1134   MCHC 34.1 07/03/2015 0941   MCHC 34.1 07/03/2015 0941   RDW 14.9 02/18/2017 1134   LYMPHSABS 2.5 02/18/2017 1134   MONOABS 0.6 07/03/2015 0941   EOSABS 0.1 02/18/2017 1134   BASOSABS 0.1 02/18/2017 1134   Iron/TIBC/Ferritin/ %Sat    Component Value Date/Time   FERRITIN 147 07/03/2015 0941   Lipid Panel     Component Value Date/Time   CHOL 186 02/18/2017 1134   TRIG 137 02/18/2017 1134   HDL 46 02/18/2017 1134   CHOLHDL 3.1 08/26/2014 1020   VLDL 21 08/26/2014 1020   LDLCALC 113 (H) 02/18/2017 1134   Hepatic Function Panel     Component Value Date/Time   PROT 6.5  02/18/2017 1134   ALBUMIN 3.9 02/18/2017 1134   AST 23 02/18/2017 1134   ALT 22 02/18/2017 1134   ALKPHOS 91 02/18/2017 1134   BILITOT 0.5 02/18/2017 1134      Component Value Date/Time   TSH 1.960 02/18/2017 1134   TSH 1.92 07/02/2016 1602   TSH 1.401 07/03/2015 0941    ASSESSMENT AND PLAN: Type 2 diabetes mellitus without complication, without long-term current use of insulin (HCC) - Plan: metFORMIN (GLUCOPHAGE) 500 MG tablet  Vitamin D deficiency - Plan: Vitamin D, Ergocalciferol, (DRISDOL) 50000 units CAPS capsule  OSA on CPAP - Plan: Ambulatory referral to Sleep Studies  Other depression - with emotional eating  - Plan: buPROPion (WELLBUTRIN SR) 150 MG 12 hr tablet  Class 3 severe obesity with serious comorbidity and body mass index (BMI) of 45.0 to 49.9 in adult, unspecified obesity type (HCC)  PLAN:  Vitamin D Deficiency Fredrika was informed that low vitamin D levels contributes to fatigue and are associated with obesity, breast, and colon cancer. She agrees to continue to take prescription Vit D @50 ,000 IU every week #4 with no refills and will follow up for routine testing of vitamin D, at least 2-3 times per year. She was informed of the risk of over-replacement of vitamin D and agrees to not increase Amanda Davidson dose unless he discusses this with [place] first. Desta agrees to follow up with our clinic in 3 weeks.  Diabetes II non insulin without complications Malley has been given extensive diabetes education by myself today including ideal fasting and post-prandial blood glucose readings, individual ideal Hgb A1c goals  and hypoglycemia prevention. We discussed the importance of good blood sugar control to decrease the likelihood of diabetic complications such as nephropathy, neuropathy, limb loss, blindness, coronary artery disease, and death. We discussed the importance of intensive lifestyle modification including diet, exercise and weight loss as the first line treatment for  diabetes. Karelyn agrees  to continue metformin 500 mg bid #60 with no refills and will follow up at the agreed upon time.  Obstructive Sleep Apnea We will refer to [facility] for a sleep study and Pegge agrees to follow up with our clinic in 3 weeks.  Depression with Emotional Eating Behaviors We discussed behavior modification techniques today to help Kaitlyn deal with Amanda Davidson emotional eating and depression. She has agreed to start Wellbutrin SR 150 mg qd #30 with no refills and will follow up as directed.  Obesity Talaya is currently in the action stage of change. As such, Amanda Davidson goal is to continue with weight loss efforts She has agreed to keep a food journal with 1200 calories and 85 grams of protein daily Tomeshia has been instructed to work up to a goal of 150 minutes of combined cardio and strengthening exercise per week for weight loss and overall health benefits. We discussed the following Behavioral Modification Strategies today: increasing lean protein intake and keep a strict food journal  Margaruite has agreed to follow up with our clinic in 3 weeks. She was informed of the importance of frequent follow up visits to maximize Amanda Davidson success with intensive lifestyle modifications for Amanda Davidson multiple health conditions.  I, [place] Crane, am acting as transcriptionist for Illa Level, PA-C  I have reviewed the above documentation for accuracy and completeness, and I agree with the above. -Illa Level, PA-C  I have reviewed the above note and agree with the plan. -Quillian Quince, MD   OBESITY BEHAVIORAL INTERVENTION VISIT  Today's visit was # 6 out of 22.  Starting weight: 292 lbs Starting date: 02/18/17 Today's weight : 293 lbs  Today's date: 05/19/2017 Total lbs lost to date: 0 (Patients must lose 7 lbs in the first 6 months to continue with counseling)   ASK: We discussed the diagnosis of obesity with Jeffrie P Sosinski today and Tensley agreed to give [place]  permission to discuss obesity behavioral modification therapy today.  ASSESS: Avanni has the diagnosis of obesity and Amanda Davidson BMI today is 47.31 Andreal is in the action stage of change   ADVISE: Twania was educated on the multiple health risks of obesity as well as the benefit of weight loss to improve Amanda Davidson health. She was advised of the need for long term treatment and the importance of lifestyle modifications.  AGREE: Multiple dietary modification options and treatment options were discussed and  Rika agreed to keep a food journal with 1200 calories and 85 grams of protein daily We discussed the following Behavioral Modification Strategies today: increasing lean protein intake and keep a strict food journal

## 2017-05-23 ENCOUNTER — Telehealth: Payer: Self-pay | Admitting: *Deleted

## 2017-05-23 NOTE — Telephone Encounter (Signed)
Pre Authorization sent to cover my meds.[facility] [id number]

## 2017-05-29 NOTE — Telephone Encounter (Signed)
Sumatriptan has been denied. Letter placed in [place] box

## 2017-06-06 ENCOUNTER — Encounter (INDEPENDENT_AMBULATORY_CARE_PROVIDER_SITE_OTHER): Payer: Self-pay | Admitting: Physician Assistant

## 2017-06-06 NOTE — Telephone Encounter (Signed)
I see where imitrex was denied. They would like for her to be on preventative. Is this something you would like to consider? Are they not approving any or just over 10 tablets. Can we call patient to better figure out what is going on?

## 2017-06-10 ENCOUNTER — Encounter (INDEPENDENT_AMBULATORY_CARE_PROVIDER_SITE_OTHER): Payer: Self-pay

## 2017-06-10 ENCOUNTER — Ambulatory Visit (INDEPENDENT_AMBULATORY_CARE_PROVIDER_SITE_OTHER): Payer: BLUE CROSS/BLUE SHIELD | Admitting: Physician Assistant

## 2017-06-11 ENCOUNTER — Ambulatory Visit (INDEPENDENT_AMBULATORY_CARE_PROVIDER_SITE_OTHER): Payer: BLUE CROSS/BLUE SHIELD | Admitting: Physician Assistant

## 2017-06-11 VITALS — BP 128/82 | HR 68 | Temp 98.2°F | Ht 66.0 in | Wt 291.0 lb

## 2017-06-11 DIAGNOSIS — E66813 Obesity, class 3: Secondary | ICD-10-CM

## 2017-06-11 DIAGNOSIS — E119 Type 2 diabetes mellitus without complications: Secondary | ICD-10-CM | POA: Diagnosis not present

## 2017-06-11 DIAGNOSIS — E559 Vitamin D deficiency, unspecified: Secondary | ICD-10-CM | POA: Diagnosis not present

## 2017-06-11 DIAGNOSIS — Z6841 Body Mass Index (BMI) 40.0 and over, adult: Secondary | ICD-10-CM

## 2017-06-11 MED ORDER — VITAMIN D (ERGOCALCIFEROL) 1.25 MG (50000 UNIT) PO CAPS: 50000.0000 [IU] | ORAL_CAPSULE | ORAL | 1 refills | Status: DC

## 2017-06-11 MED ORDER — METFORMIN HCL 500 MG PO TABS: 500.0000 mg | ORAL_TABLET | Freq: Two times a day (BID) | ORAL | 0 refills | Status: DC

## 2017-06-12 NOTE — Progress Notes (Addendum)
Office: [phone]  /  Fax: [phone]   HPI:   Chief Complaint: OBESITY Amanda Davidson is here to discuss her progress with her obesity treatment plan. She is on the keep a food journal with 1200 calories and 85 grams of protein daily and is following her eating plan approximately 80 % of the time. She states she is exercising 0 minutes 0 times per week. Amanda Davidson continues to do well with weight loss. She is mindful of her eating and states her cravings have improved after the Wellbutrin. Her weight is 291 lb (132 kg) today and has had a weight loss of 2 pounds over a period of 3 weeks since her last visit. She has lost 1 lb since starting treatment with [place].  Vitamin D deficiency Amanda Davidson has a diagnosis of vitamin D deficiency. She is currently taking vit D and denies nausea, vomiting or muscle weakness.   Ref. Range 02/18/2017 11:34  Vitamin D, 25-Hydroxy Latest Ref Range: 30.0 - 100.0 ng/mL 27.4 (L)   Diabetes II non insulin without complications Amanda Davidson has a diagnosis of diabetes type II. Amanda Davidson is not checking BGs at home and denies any hypoglycemic episodes. She has been working on intensive lifestyle modifications including diet, exercise, and weight loss to help control her blood glucose levels.  ALLERGIES: No Known Allergies  MEDICATIONS: Current Outpatient Medications on File Prior to Visit  Medication Sig Dispense Refill  . albuterol (PROVENTIL HFA;VENTOLIN HFA) 108 (90 Base) MCG/ACT inhaler Inhale 2 puffs into lungs every 6 hours as needed for wheezing 3 Inhaler 1  . buPROPion (WELLBUTRIN SR) 150 MG 12 hr tablet Take 1 tablet (150 mg total) by mouth daily. 30 tablet 0  . clonazePAM (KLONOPIN) 0.5 MG tablet TAKE ONE TABLET BY MOUTH TWO TIMES DAILY AS NEEDED FOR ANXIETY 30 tablet 1  . levothyroxine (SYNTHROID, LEVOTHROID) 137 MCG tablet TAKE ONE TABLET BY MOUTH EVERY DAY BEFORE BREAKFAST 90 tablet 3  . montelukast (SINGULAIR) 10 MG tablet TAKE 1 TABLET (10 MG TOTAL) BY MOUTH  AT BEDTIME. 90 tablet 4  . sertraline (ZOLOFT) 50 MG tablet Take 1 tablet (50 mg total) by mouth daily. 90 tablet 3  . SUMAtriptan (IMITREX) 100 MG tablet TAKE ONE TAB BY MOUTH EVERY 2 HOURS AS NEEDED FOR MIGRAINE.DUE FOR VISIT 10 tablet 0   No current facility-administered medications on file prior to visit.     PAST MEDICAL HISTORY: Past Medical History:  Diagnosis Date  . Anemia   . Asthma   . Complication of anesthesia    NAUSEA  . Depression   . Diabetes mellitus without complication (HCC)   . Drug use   . Exercise-induced asthma   . GERD (gastroesophageal reflux disease)   . Headache(784.0)    MIGRAINES  . Hypertension   . Hypothyroidism   . IBS (irritable bowel syndrome)   . Migraines   . Obesity   . Post-nasal drip   . Shortness of breath   . Sleep apnea    STOPPPED USING C-PAP 6 MO AGO  . Vitamin D deficiency     PAST SURGICAL HISTORY: Past Surgical History:  Procedure Laterality Date  . HERNIA REPAIR    . LAPAROSCOPIC GASTRIC SLEEVE RESECTION N/A 05/14/2013   Procedure: LAPAROSCOPIC GASTRIC SLEEVE RESECTION;  Surgeon: Valarie Merino, MD;  Location: [facility];  Service: General;  Laterality: N/A;  . UMBILICAL HERNIA REPAIR  05/14/2013   Procedure: HERNIA REPAIR UMBILICAL ADULT;  Surgeon: Valarie Merino, MD;  Location: [facility];  Service: General;;  . UPPER GI ENDOSCOPY  05/14/2013   Procedure: UPPER GI ENDOSCOPY;  Surgeon: Valarie Merino, MD;  Location: [facility];  Service: General;;    SOCIAL HISTORY: Social History   Tobacco Use  . Smoking status: Never Smoker  . Smokeless tobacco: Never Used  Substance Use Topics  . Alcohol use: Yes    Alcohol/week: 0.5 oz    Types: 1 drink(s) per week    Comment: 2 glasses wine @ 2x/month  . Drug use: No    FAMILY HISTORY: Family History  Problem Relation Age of Onset  . Hypertension Mother   . Heart disease Mother   . Depression Mother   . Hypertension Father   . Diabetes Father   . Sleep apnea Father     . Heart disease Maternal Grandfather   . Heart attack Maternal Grandfather   . Cancer Maternal Grandfather        bladder  . Hyperlipidemia Other   . Sleep apnea Other     ROS: Review of Systems  Constitutional: Positive for weight loss.  Gastrointestinal: Negative for nausea and vomiting.  Musculoskeletal:       Negative muscle weakness  Endo/Heme/Allergies:       Negative polyphagia Negative hypoglycemia     PHYSICAL EXAM: Blood pressure 128/82, pulse 68, temperature 98.2 F (36.8 C), temperature source Oral, height 5\' 6"  (1.676 m), weight 291 lb (132 kg), last menstrual period 06/03/2017, SpO2 97 %. Body mass index is 46.97 kg/m. Physical Exam  Constitutional: She is oriented to person, place, and time. She appears well-developed and well-nourished.  Cardiovascular: Normal rate.  Pulmonary/Chest: Effort normal.  Musculoskeletal: Normal range of motion.  Neurological: She is oriented to person, place, and time.  Skin: Skin is warm and dry.  Psychiatric: She has a normal mood and affect. Her behavior is normal.  Vitals reviewed.   RECENT LABS AND TESTS: BMET    Component Value Date/Time   NA 137 02/18/2017 1134   K 4.6 02/18/2017 1134   CL 100 02/18/2017 1134   CO2 24 02/18/2017 1134   GLUCOSE 92 02/18/2017 1134   GLUCOSE 94 07/02/2016 1602   BUN 13 02/18/2017 1134   CREATININE 0.65 02/18/2017 1134   CREATININE 1.00 07/02/2016 1602   CALCIUM 9.5 02/18/2017 1134   GFRNONAA 106 02/18/2017 1134   GFRNONAA 67 07/02/2016 1602   GFRAA 122 02/18/2017 1134   GFRAA 78 07/02/2016 1602   Lab Results  Component Value Date   HGBA1C 6.0 (H) 02/18/2017   HGBA1C 5.6 07/02/2016   HGBA1C 5.8 (H) 07/03/2015   HGBA1C 5.5 07/03/2015   HGBA1C 5.7 (H) 08/26/2014   Lab Results  Component Value Date   INSULIN 12.2 02/18/2017   CBC    Component Value Date/Time   WBC 9.6 02/18/2017 1134   WBC 8.7 07/03/2015 0941   WBC 8.7 07/03/2015 0941   RBC 4.73 02/18/2017 1134    RBC 4.75 07/03/2015 0941   RBC 4.75 07/03/2015 0941   HGB 13.3 02/18/2017 1134   HCT 40.7 02/18/2017 1134   PLT 349 07/03/2015 0941   PLT 349 07/03/2015 0941   MCV 86 02/18/2017 1134   MCH 28.1 02/18/2017 1134   MCH 29.7 07/03/2015 0941   MCH 29.7 07/03/2015 0941   MCHC 32.7 02/18/2017 1134   MCHC 34.1 07/03/2015 0941   MCHC 34.1 07/03/2015 0941   RDW 14.9 02/18/2017 1134   LYMPHSABS 2.5 02/18/2017 1134   MONOABS 0.6 07/03/2015 0941  EOSABS 0.1 02/18/2017 1134   BASOSABS 0.1 02/18/2017 1134   Iron/TIBC/Ferritin/ %Sat    Component Value Date/Time   FERRITIN 147 07/03/2015 0941   Lipid Panel     Component Value Date/Time   CHOL 186 02/18/2017 1134   TRIG 137 02/18/2017 1134   HDL 46 02/18/2017 1134   CHOLHDL 3.1 08/26/2014 1020   VLDL 21 08/26/2014 1020   LDLCALC 113 (H) 02/18/2017 1134   Hepatic Function Panel     Component Value Date/Time   PROT 6.5 02/18/2017 1134   ALBUMIN 3.9 02/18/2017 1134   AST 23 02/18/2017 1134   ALT 22 02/18/2017 1134   ALKPHOS 91 02/18/2017 1134   BILITOT 0.5 02/18/2017 1134      Component Value Date/Time   TSH 1.960 02/18/2017 1134   TSH 1.92 07/02/2016 1602   TSH 1.401 07/03/2015 0941     Ref. Range 02/18/2017 11:34  Vitamin D, 25-Hydroxy Latest Ref Range: 30.0 - 100.0 ng/mL 27.4 (L)    ASSESSMENT AND PLAN: Vitamin D deficiency - Plan: Vitamin D, Ergocalciferol, (DRISDOL) 50000 units CAPS capsule  Type 2 diabetes mellitus without complication, without long-term current use of insulin (HCC) - Plan: metFORMIN (GLUCOPHAGE) 500 MG tablet  Class 3 severe obesity with serious comorbidity and body mass index (BMI) of 45.0 to 49.9 in adult, unspecified obesity type (HCC)  PLAN: Vitamin D Deficiency Amanda Davidson was informed that low vitamin D levels contributes to fatigue and are associated with obesity, breast, and colon cancer. She agrees to continue to take prescription Vit D @50 ,000 IU every week #4 with no refills and will follow up  for routine testing of vitamin D, at least 2-3 times per year. She was informed of the risk of over-replacement of vitamin D and agrees to not increase her dose unless he discusses this with us first. Amanda Davidson agrees to follow up with our clinic in 2 weeks.  Diabetes II Amanda Davidson has been given extensive diabetes education by myself today including ideal fasting and post-prandial blood glucose readings, individual ideal Hgb A1c goals  and hypoglycemia prevention. We discussed the importance of good blood sugar control to decrease the likelihood of diabetic complications such as nephropathy, neuropathy, limb loss, blindness, coronary artery disease, and death. We discussed the importance of intensive lifestyle modification including diet, exercise and weight loss as the first line treatment for diabetes. Amanda Davidson agrees to continue metformin 500 mg bid #60 with no refills and follow up at the agreed upon time.  Obesity Amanda Davidson is currently in the action stage of change. As such, her goal is to continue with weight loss efforts She has agreed to keep a food journal with 1200 calories and 85 grams of protein daily Amanda Davidson has been instructed to work up to a goal of 150 minutes of combined cardio and strengthening exercise per week for weight loss and overall health benefits. We discussed the following Behavioral Modification Strategies today: increasing lean protein intake and planning for success  Amanda Davidson has agreed to follow up with our clinic in 2 weeks. She was informed of the importance of frequent follow up visits to maximize her success with intensive lifestyle modifications for her multiple health conditions.  Amanda Davidson, Amanda Davidson, am acting as transcriptionist for Illa LevelSahar Osman, PA-C I Illa LevelSahar Davidson [facility] have reviewed this note and agree with its contents   OBESITY BEHAVIORAL INTERVENTION VISIT  Today's visit was # 7 out of 22.  Starting weight: 292 lbs Starting date: 02/18/17 Today's weight : 291  lbs  Today's date: 06/11/2017 Total lbs lost to date: 1 (Patients must lose 7 lbs in the first 6 months to continue with counseling)   ASK: We discussed the diagnosis of obesity with Jodeci P Pronovost today and Jeany agreed to give [place] permission to discuss obesity behavioral modification therapy today.  ASSESS: Keianna has the diagnosis of obesity and her BMI today is 46.99 Tekeshia is in the action stage of change   ADVISE: Johna was educated on the multiple health risks of obesity as well as the benefit of weight loss to improve her health. She was advised of the need for long term treatment and the importance of lifestyle modifications.  AGREE: Multiple dietary modification options and treatment options were discussed and  Daniel agreed to the above obesity treatment plan.  I have reviewed the above documentation for accuracy and completeness, and I agree with the above. -Quillian Quince, MD

## 2017-06-18 DIAGNOSIS — F4323 Adjustment disorder with mixed anxiety and depressed mood: Secondary | ICD-10-CM | POA: Diagnosis not present

## 2017-06-19 ENCOUNTER — Encounter (INDEPENDENT_AMBULATORY_CARE_PROVIDER_SITE_OTHER): Payer: Self-pay | Admitting: Physician Assistant

## 2017-06-19 ENCOUNTER — Other Ambulatory Visit (INDEPENDENT_AMBULATORY_CARE_PROVIDER_SITE_OTHER): Payer: Self-pay

## 2017-06-19 DIAGNOSIS — F3289 Other specified depressive episodes: Secondary | ICD-10-CM

## 2017-06-19 MED ORDER — BUPROPION HCL ER (SR) 150 MG PO TB12: 150.0000 mg | ORAL_TABLET | Freq: Every day | ORAL | 0 refills | Status: DC

## 2017-06-19 NOTE — Telephone Encounter (Signed)
You may refill °

## 2017-06-23 ENCOUNTER — Ambulatory Visit (INDEPENDENT_AMBULATORY_CARE_PROVIDER_SITE_OTHER): Payer: BLUE CROSS/BLUE SHIELD | Admitting: Neurology

## 2017-06-23 ENCOUNTER — Encounter: Payer: Self-pay | Admitting: Neurology

## 2017-06-23 VITALS — BP 133/79 | HR 77 | Wt 301.0 lb

## 2017-06-23 DIAGNOSIS — Z9989 Dependence on other enabling machines and devices: Secondary | ICD-10-CM

## 2017-06-23 DIAGNOSIS — R51 Headache: Secondary | ICD-10-CM

## 2017-06-23 DIAGNOSIS — Z6841 Body Mass Index (BMI) 40.0 and over, adult: Secondary | ICD-10-CM | POA: Diagnosis not present

## 2017-06-23 DIAGNOSIS — Z820 Family history of epilepsy and other diseases of the nervous system: Secondary | ICD-10-CM

## 2017-06-23 DIAGNOSIS — R519 Headache, unspecified: Secondary | ICD-10-CM

## 2017-06-23 DIAGNOSIS — G4733 Obstructive sleep apnea (adult) (pediatric): Secondary | ICD-10-CM | POA: Diagnosis not present

## 2017-06-23 DIAGNOSIS — G4719 Other hypersomnia: Secondary | ICD-10-CM

## 2017-06-23 NOTE — Patient Instructions (Addendum)

## 2017-06-23 NOTE — Progress Notes (Signed)
Subjective:    Patient ID: Amanda Davidson is a 47 y.o. female.  HPI      Amanda FoleySaima Jelitza Manninen, MD, PhD [facility] [address], [address] [address] [place], [place] [id number]  Dear Amanda Davidson,   I saw your patient, Amanda Davidson, upon your kind request in my neurologic clinic today for initial consultation of her sleep disorder, in particular, evaluation for obstructive sleep apnea. The patient is unaccompanied today. As you know, Amanda Davidson is a 47 year old right-handed woman with an underlying medical history of hypertension, diabetes, asthma, anemia, vitamin D deficiency, migraine headaches, IBS, hypothyroidism, reflux disease, depression, and morbid obesity with a BMI of over 45, who was previously diagnosed with obstructive sleep apnea. She has an older CPAP machine. I reviewed her CPAP download from 05/24/2017 through 06/22/2017, which is a total of 30 days, during which time she used her machine 26 days with percent used days greater than 4 hours at 80%, indicating very good compliance with an average usage of 6 hours and 23 minutes, residual AHI 3 per hour, set pressure at 12 cm, leak on the low side. She endorses significant daytime somnolence, her Epworth sleepiness score is 18 out of 24, fatigue score is 35 out of 63. She does not have a copy of her sleep study, she is not currently plugged in with a local DME company. She had [place] in [place], over 10 years ago. She believes, her DME may have been [facility]. We called the local [facility] office, but they do not have any records on her. She works as a Naval architectpurchasing agent. Bedtime is around 10 or 11 PM, rise time around 5:30. She does not have night to night nocturia, she has had occasional morning headaches and has a history of migraines. She has a family history of OSA in her father. She is divorced, lives with her 2 children, 49 year old son and 49 year old daughter. She has had increase in stress in the past 2 years, she has  had weight gain in the past several years. She has a TV in her bedroom but does not watch it typically at night. She tries to maintain good sleep hygiene. She is a nonsmoker, does not currently drink alcohol, drinks caffeine in limitation, typically 1 cup of coffee in the morning. She has a cat and a dog in her bedroom, occasionally on her bed which is a king size bed.  Her Past Medical History Is Significant For: Past Medical History:  Diagnosis Date  . Anemia   . Asthma   . Complication of anesthesia    NAUSEA  . Depression   . Diabetes mellitus without complication (HCC)   . Drug use   . Exercise-induced asthma   . GERD (gastroesophageal reflux disease)   . Headache(784.0)    MIGRAINES  . Hypertension   . Hypothyroidism   . IBS (irritable bowel syndrome)   . Migraines   . Obesity   . Post-nasal drip   . Shortness of breath   . Sleep apnea    STOPPPED USING C-PAP 6 MO AGO  . Vitamin D deficiency     Her Past Surgical History Is Significant For: Past Surgical History:  Procedure Laterality Date  . HERNIA REPAIR    . LAPAROSCOPIC GASTRIC SLEEVE RESECTION N/A 05/14/2013   Procedure: LAPAROSCOPIC GASTRIC SLEEVE RESECTION;  Surgeon: Valarie MerinoMatthew B Martin, MD;  Location: [facility];  Service: General;  Laterality: N/A;  . UMBILICAL HERNIA REPAIR  05/14/2013   Procedure: HERNIA  REPAIR UMBILICAL ADULT;  Surgeon: Valarie Merino, MD;  Location: [facility];  Service: General;;  . UPPER GI ENDOSCOPY  05/14/2013   Procedure: UPPER GI ENDOSCOPY;  Surgeon: Valarie Merino, MD;  Location: [facility];  Service: General;;    Her Family History Is Significant For: Family History  Problem Relation Age of Onset  . Hypertension Mother   . Heart disease Mother   . Depression Mother   . Hypertension Father   . Diabetes Father   . Sleep apnea Father   . Heart disease Maternal Grandfather   . Heart attack Maternal Grandfather   . Cancer Maternal Grandfather        bladder  . Hyperlipidemia Other   .  Sleep apnea Other     Her Social History Is Significant For: Social History   Socioeconomic History  . Marital status: Divorced    Spouse name: None  . Number of children: None  . Years of education: None  . Highest education level: None  Social Needs  . Financial resource strain: None  . Food insecurity - worry: None  . Food insecurity - inability: None  . Transportation needs - medical: None  . Transportation needs - non-medical: None  Occupational History  . Occupation: Naval architect  Tobacco Use  . Smoking status: Never Smoker  . Smokeless tobacco: Never Used  Substance and Sexual Activity  . Alcohol use: Yes    Alcohol/week: 0.5 oz    Types: 1 drink(s) per week    Comment: 2 glasses wine @ 2x/month  . Drug use: No  . Sexual activity: None  Other Topics Concern  . None  Social History Narrative  . None    Her Allergies Are:  No Known Allergies:   Her Current Medications Are:  Outpatient Encounter Medications as of 06/23/2017  Medication Sig  . albuterol (PROVENTIL HFA;VENTOLIN HFA) 108 (90 Base) MCG/ACT inhaler Inhale 2 puffs into lungs every 6 hours as needed for wheezing  . buPROPion (WELLBUTRIN SR) 150 MG 12 hr tablet Take 1 tablet (150 mg total) by mouth daily.  . clonazePAM (KLONOPIN) 0.5 MG tablet TAKE ONE TABLET BY MOUTH TWO TIMES DAILY AS NEEDED FOR ANXIETY  . levothyroxine (SYNTHROID, LEVOTHROID) 137 MCG tablet TAKE ONE TABLET BY MOUTH EVERY DAY BEFORE BREAKFAST  . metFORMIN (GLUCOPHAGE) 500 MG tablet Take 1 tablet (500 mg total) by mouth 2 (two) times daily with a meal.  . montelukast (SINGULAIR) 10 MG tablet TAKE 1 TABLET (10 MG TOTAL) BY MOUTH AT BEDTIME.  Marland Kitchen sertraline (ZOLOFT) 50 MG tablet Take 1 tablet (50 mg total) by mouth daily.  . SUMAtriptan (IMITREX) 100 MG tablet TAKE ONE TAB BY MOUTH EVERY 2 HOURS AS NEEDED FOR MIGRAINE.DUE FOR VISIT  . Vitamin D, Ergocalciferol, (DRISDOL) 50000 units CAPS capsule Take 1 capsule (50,000 Units total) by  mouth every 7 (seven) days.   No facility-administered encounter medications on file as of 06/23/2017.   :  Review of Systems:  Out of a complete 14 point review of systems, all are reviewed and negative with the exception of these symptoms as listed below:  Review of Systems  Neurological:       Pt presents today to discuss her cpap. Her current cpap is more than 49 years old. Pt does not have a DME. Pt is unsure of when her last sleep study was but it was greater than 10 years ago.  Epworth Sleepiness Scale 0= would never doze 1= slight chance of  dozing 2= moderate chance of dozing 3= high chance of dozing  Sitting and reading: 3 Watching TV: 3 Sitting inactive in a public place (ex. Theater or meeting): 2 As a passenger in a car for an hour without a break: 3 Lying down to rest in the afternoon: 3 Sitting and talking to someone: 1 Sitting quietly after lunch (no alcohol): 2 In a car, while stopped in traffic: 1 Total: 18     Objective:  Neurological Exam  Physical Exam Physical Examination:   Vitals:   06/23/17 1528  BP: 133/79  Pulse: 77   General Examination: The patient is a very pleasant 47 y.o. female in no acute distress. She appears well-developed and well-nourished and well groomed.   HEENT: Normocephalic, atraumatic, pupils are equal, round and reactive to light and accommodation. Extraocular tracking is good without limitation to gaze excursion or nystagmus noted. Normal smooth pursuit is noted. Hearing is grossly intact. Face is symmetric with normal facial animation and normal facial sensation. Speech is clear with no dysarthria noted. There is no hypophonia. There is no lip, neck/head, jaw or voice tremor. Neck is supple with full range of passive and active motion. There are no carotid bruits on auscultation. Oropharynx exam reveals: mild mouth dryness, adequate dental hygiene and moderate airway crowding, due to smaller airway entry, tonsillar size of 3+  bilaterally, uvula is longish but thin. Mallampati is class II. Tongue protrudes centrally and palate elevates symmetrically. Neck size is 17 7/8 inches.   Chest: Clear to auscultation without wheezing, rhonchi or crackles noted.  Heart: S1+S2+0, regular and normal without murmurs, rubs or gallops noted.   Abdomen: Soft, non-tender and non-distended with normal bowel sounds appreciated on auscultation.  Extremities: There is trace pitting edema in the distal lower extremities bilaterally. Pedal pulses are intact.  Skin: Warm and dry without trophic changes noted.  Musculoskeletal: exam reveals no obvious joint deformities, tenderness or joint swelling or erythema.   Neurologically:  Mental status: The patient is awake, alert and oriented in all 4 spheres. Her immediate and remote memory, attention, language skills and fund of knowledge are appropriate. There is no evidence of aphasia, agnosia, apraxia or anomia. Speech is clear with normal prosody and enunciation. Thought process is linear. Mood is normal and affect is normal.  Cranial nerves II - XII are as described above under HEENT exam. In addition: shoulder shrug is normal with equal shoulder height noted. Motor exam: Normal bulk, strength and tone is noted. There is no drift, tremor or rebound. Romberg is negative. Reflexes are 1+ throughout. Fine motor skills and coordination: intact with normal finger taps, normal hand movements, normal rapid alternating patting, normal foot taps and normal foot agility.  Cerebellar testing: No dysmetria or intention tremor on finger to nose testing. Heel to shin is unremarkable bilaterally, slightly difficult due to body habitus. There is no truncal or gait ataxia.  Sensory exam: intact to light touch the upper and lower extremities.  Gait, station and balance: She stands easily. No veering to one side is noted. No leaning to one side is noted. Posture is age-appropriate and stance is narrow based. Gait  shows normal stride length and normal pace. No problems turning are noted. Tandem walk is unremarkable.  Assessment and Plan:  In summary, Amanda Davidson is a very pleasant 47 y.o.-year old female with an underlying medical history of hypertension, diabetes, asthma, anemia, vitamin D deficiency, migraine headaches, IBS, hypothyroidism, reflux disease, depression, and morbid obesity with a  BMI of over 45, who presents for sleep consultation with a prior diagnosis of what sounds like at least moderate obstructive sleep apnea as she had a split-night sleep study over 10 years ago. She has been using an old CPAP machine but does need new equipment and would also benefit from upgrading to a new CPAP machine. She has been using a fullface mask and reports being a mouth breather. She would benefit from reevaluation and adjustment of settings and trying out a new interface. To that end, I will order sleep study testing. I had a long chat with the patient about my findings and the diagnosis of OSA, its prognosis and treatment options. I explained in particular the risks and ramifications of untreated moderate to severe OSA, especially with respect to developing cardiovascular disease down the Road, including congestive heart failure, difficult to treat hypertension, cardiac arrhythmias, or stroke. Even type 2 diabetes has, in part, been linked to untreated OSA. Symptoms of untreated OSA include daytime sleepiness, memory problems, mood irritability and mood disorder such as depression and anxiety, lack of energy, as well as recurrent headaches, especially morning headaches. We talked about trying to maintain a healthy lifestyle in general, as well as the importance of weight control. I encouraged the patient to eat healthy, exercise daily and keep well hydrated, to keep a scheduled bedtime and wake time routine, to not skip any meals and eat healthy snacks in between meals. I advised the patient not to drive when  feeling sleepy. I recommended the following at this time: sleep study with potential positive airway pressure titration. (We will score hypopneas at 3%).   I answered all her questions today and the patient was in agreement. I would like to see her back after the sleep study is completed and encouraged her to call with any interim questions, concerns, problems or updates.   Thank you very much for allowing me to participate in the care of this nice patient. If I can be of any further assistance to you please do not hesitate to call me at [phone].  Sincerely,   Amanda Foley, MD, PhD

## 2017-06-25 ENCOUNTER — Ambulatory Visit (INDEPENDENT_AMBULATORY_CARE_PROVIDER_SITE_OTHER): Payer: BLUE CROSS/BLUE SHIELD | Admitting: Physician Assistant

## 2017-06-25 VITALS — BP 132/83 | HR 88 | Temp 98.3°F | Ht 66.0 in | Wt 294.0 lb

## 2017-06-25 DIAGNOSIS — E039 Hypothyroidism, unspecified: Secondary | ICD-10-CM

## 2017-06-25 DIAGNOSIS — Z6841 Body Mass Index (BMI) 40.0 and over, adult: Secondary | ICD-10-CM | POA: Diagnosis not present

## 2017-06-25 NOTE — Progress Notes (Signed)
Office: [phone]  /  Fax: [phone]   HPI:   Chief Complaint: OBESITY Amanda Davidson is here to discuss her progress with her obesity treatment plan. She is on the keep a food journal with 1200 calories and 85 grams of protein daily and is following her eating plan approximately 60 % of the time. She states she is exercising 0 minutes 0 times per week. Amanda Davidson has not been keeping a food journal for her needs and is not following a structured meal plan. She would like more meal planning ideas.  Her weight is 294 lb (133.4 kg) today and has gained 3 pounds since her last visit. She has lost 0 lbs since starting treatment with [place].  Hypothyroidism Amanda Davidson has a diagnosis of hypothyroidism. She is on levothyroxine. She denies hot or cold intolerance, palpitations, or fatigue.  ALLERGIES: No Known Allergies  MEDICATIONS: Current Outpatient Medications on File Prior to Visit  Medication Sig Dispense Refill  . albuterol (PROVENTIL HFA;VENTOLIN HFA) 108 (90 Base) MCG/ACT inhaler Inhale 2 puffs into lungs every 6 hours as needed for wheezing 3 Inhaler 1  . buPROPion (WELLBUTRIN SR) 150 MG 12 hr tablet Take 1 tablet (150 mg total) by mouth daily. 30 tablet 0  . clonazePAM (KLONOPIN) 0.5 MG tablet TAKE ONE TABLET BY MOUTH TWO TIMES DAILY AS NEEDED FOR ANXIETY 30 tablet 1  . levothyroxine (SYNTHROID, LEVOTHROID) 137 MCG tablet TAKE ONE TABLET BY MOUTH EVERY DAY BEFORE BREAKFAST 90 tablet 3  . metFORMIN (GLUCOPHAGE) 500 MG tablet Take 1 tablet (500 mg total) by mouth 2 (two) times daily with a meal. 60 tablet 0  . montelukast (SINGULAIR) 10 MG tablet TAKE 1 TABLET (10 MG TOTAL) BY MOUTH AT BEDTIME. 90 tablet 4  . sertraline (ZOLOFT) 50 MG tablet Take 1 tablet (50 mg total) by mouth daily. 90 tablet 3  . SUMAtriptan (IMITREX) 100 MG tablet TAKE ONE TAB BY MOUTH EVERY 2 HOURS AS NEEDED FOR MIGRAINE.DUE FOR VISIT 10 tablet 0  . Vitamin D, Ergocalciferol, (DRISDOL) 50000 units CAPS capsule Take 1  capsule (50,000 Units total) by mouth every 7 (seven) days. 4 capsule 1   No current facility-administered medications on file prior to visit.     PAST MEDICAL HISTORY: Past Medical History:  Diagnosis Date  . Anemia   . Asthma   . Complication of anesthesia    NAUSEA  . Depression   . Diabetes mellitus without complication (HCC)   . Drug use   . Exercise-induced asthma   . GERD (gastroesophageal reflux disease)   . Headache(784.0)    MIGRAINES  . Hypertension   . Hypothyroidism   . IBS (irritable bowel syndrome)   . Migraines   . Obesity   . Post-nasal drip   . Shortness of breath   . Sleep apnea    STOPPPED USING C-PAP 6 MO AGO  . Vitamin D deficiency     PAST SURGICAL HISTORY: Past Surgical History:  Procedure Laterality Date  . HERNIA REPAIR    . LAPAROSCOPIC GASTRIC SLEEVE RESECTION N/A 05/14/2013   Procedure: LAPAROSCOPIC GASTRIC SLEEVE RESECTION;  Surgeon: Valarie Merino, MD;  Location: [facility];  Service: General;  Laterality: N/A;  . UMBILICAL HERNIA REPAIR  05/14/2013   Procedure: HERNIA REPAIR UMBILICAL ADULT;  Surgeon: Valarie Merino, MD;  Location: [facility];  Service: General;;  . UPPER GI ENDOSCOPY  05/14/2013   Procedure: UPPER GI ENDOSCOPY;  Surgeon: Valarie Merino, MD;  Location: [facility];  Service: General;;  SOCIAL HISTORY: Social History   Tobacco Use  . Smoking status: Never Smoker  . Smokeless tobacco: Never Used  Substance Use Topics  . Alcohol use: Yes    Alcohol/week: 0.5 oz    Types: 1 drink(s) per week    Comment: 2 glasses wine @ 2x/month  . Drug use: No    FAMILY HISTORY: Family History  Problem Relation Age of Onset  . Hypertension Mother   . Heart disease Mother   . Depression Mother   . Hypertension Father   . Diabetes Father   . Sleep apnea Father   . Heart disease Maternal Grandfather   . Heart attack Maternal Grandfather   . Cancer Maternal Grandfather        bladder  . Hyperlipidemia Other   . Sleep apnea  Other     ROS: Review of Systems  Constitutional: Negative for malaise/fatigue and weight loss.       Negative hot/cold intolerance  Cardiovascular: Negative for palpitations.    PHYSICAL EXAM: Blood pressure 132/83, pulse 88, temperature 98.3 F (36.8 C), temperature source Oral, height 5\' 6"  (1.676 m), weight 294 lb (133.4 kg), last menstrual period 06/03/2017, SpO2 98 %. Body mass index is 47.45 kg/m. Physical Exam  Constitutional: She is oriented to person, place, and time. She appears well-developed and well-nourished.  Cardiovascular: Normal rate.  Pulmonary/Chest: Effort normal.  Musculoskeletal: Normal range of motion.  Neurological: She is oriented to person, place, and time.  Skin: Skin is warm and dry.  Psychiatric: She has a normal mood and affect. Her behavior is normal.  Vitals reviewed.   RECENT LABS AND TESTS: BMET    Component Value Date/Time   NA 137 02/18/2017 1134   K 4.6 02/18/2017 1134   CL 100 02/18/2017 1134   CO2 24 02/18/2017 1134   GLUCOSE 92 02/18/2017 1134   GLUCOSE 94 07/02/2016 1602   BUN 13 02/18/2017 1134   CREATININE 0.65 02/18/2017 1134   CREATININE 1.00 07/02/2016 1602   CALCIUM 9.5 02/18/2017 1134   GFRNONAA 106 02/18/2017 1134   GFRNONAA 67 07/02/2016 1602   GFRAA 122 02/18/2017 1134   GFRAA 78 07/02/2016 1602   Lab Results  Component Value Date   HGBA1C 6.0 (H) 02/18/2017   HGBA1C 5.6 07/02/2016   HGBA1C 5.8 (H) 07/03/2015   HGBA1C 5.5 07/03/2015   HGBA1C 5.7 (H) 08/26/2014   Lab Results  Component Value Date   INSULIN 12.2 02/18/2017   CBC    Component Value Date/Time   WBC 9.6 02/18/2017 1134   WBC 8.7 07/03/2015 0941   WBC 8.7 07/03/2015 0941   RBC 4.73 02/18/2017 1134   RBC 4.75 07/03/2015 0941   RBC 4.75 07/03/2015 0941   HGB 13.3 02/18/2017 1134   HCT 40.7 02/18/2017 1134   PLT 349 07/03/2015 0941   PLT 349 07/03/2015 0941   MCV 86 02/18/2017 1134   MCH 28.1 02/18/2017 1134   MCH 29.7 07/03/2015 0941     MCH 29.7 07/03/2015 0941   MCHC 32.7 02/18/2017 1134   MCHC 34.1 07/03/2015 0941   MCHC 34.1 07/03/2015 0941   RDW 14.9 02/18/2017 1134   LYMPHSABS 2.5 02/18/2017 1134   MONOABS 0.6 07/03/2015 0941   EOSABS 0.1 02/18/2017 1134   BASOSABS 0.1 02/18/2017 1134   Iron/TIBC/Ferritin/ %Sat    Component Value Date/Time   FERRITIN 147 07/03/2015 0941   Lipid Panel     Component Value Date/Time   CHOL 186 02/18/2017 1134   TRIG  137 02/18/2017 1134   HDL 46 02/18/2017 1134   CHOLHDL 3.1 08/26/2014 1020   VLDL 21 08/26/2014 1020   LDLCALC 113 (H) 02/18/2017 1134   Hepatic Function Panel     Component Value Date/Time   PROT 6.5 02/18/2017 1134   ALBUMIN 3.9 02/18/2017 1134   AST 23 02/18/2017 1134   ALT 22 02/18/2017 1134   ALKPHOS 91 02/18/2017 1134   BILITOT 0.5 02/18/2017 1134      Component Value Date/Time   TSH 1.960 02/18/2017 1134   TSH 1.92 07/02/2016 1602   TSH 1.401 07/03/2015 0941    ASSESSMENT AND PLAN: Hypothyroidism, unspecified type  Class 3 severe obesity with serious comorbidity and body mass index (BMI) of 45.0 to 49.9 in adult, unspecified obesity type (HCC)  PLAN:  Hypothyroidism Amanda Davidson was informed of the importance of good thyroid control to help with weight loss efforts. She was also informed that supertheraputic thyroid levels are dangerous and will not improve weight loss results. Amanda Davidson agrees to continue taking her medications as prescribed. Amanda Davidson agrees to follow up with our clinic in 2 to 3 weeks.  We spent > than 50% of the 15 minute visit on the counseling as documented in the note.  Obesity Amanda Davidson is currently in the action stage of change. As such, her goal is to continue with weight loss efforts She has agreed to change to follow the Category 2 plan Amanda Davidson has been instructed to work up to a goal of 150 minutes of combined cardio and strengthening exercise per week for weight loss and overall health benefits. We discussed the  following Behavioral Modification Strategies today: increasing lean protein intake and work on meal planning and easy cooking plans   Branna has agreed to follow up with our clinic in 2 to 3 weeks. She was informed of the importance of frequent follow up visits to maximize her success with intensive lifestyle modifications for her multiple health conditions.    OBESITY BEHAVIORAL INTERVENTION VISIT  Today's visit was # 8 out of 22.  Starting weight: 292 lbs Starting date: 02/18/17 Today's weight : 294 lbs  Today's date: 06/30/2017 Total lbs lost to date: 0 (Patients must lose 7 lbs in the first 6 months to continue with counseling)   ASK: We discussed the diagnosis of obesity with Amanda Davidson today and Amanda Davidson agreed to give [place] permission to discuss obesity behavioral modification therapy today.  ASSESS: Amanda Davidson has the diagnosis of obesity and her BMI today is 47.48 Amanda Davidson is in the action stage of change   ADVISE: Amanda Davidson was educated on the multiple health risks of obesity as well as the benefit of weight loss to improve her health. She was advised of the need for long term treatment and the importance of lifestyle modifications.  AGREE: Multiple dietary modification options and treatment options were discussed and  Amanda Davidson agreed to the above obesity treatment plan.   Amanda Davidson, am acting as transcriptionist for Illa Level, PA-C I, Illa Level [facility], have reviewed this note and agree with its contents.

## 2017-07-03 DIAGNOSIS — F4323 Adjustment disorder with mixed anxiety and depressed mood: Secondary | ICD-10-CM | POA: Diagnosis not present

## 2017-07-12 ENCOUNTER — Other Ambulatory Visit: Payer: Self-pay | Admitting: Physician Assistant

## 2017-07-13 ENCOUNTER — Other Ambulatory Visit: Payer: Self-pay | Admitting: Physician Assistant

## 2017-07-16 ENCOUNTER — Ambulatory Visit (INDEPENDENT_AMBULATORY_CARE_PROVIDER_SITE_OTHER): Payer: BLUE CROSS/BLUE SHIELD | Admitting: Physician Assistant

## 2017-07-23 ENCOUNTER — Ambulatory Visit (INDEPENDENT_AMBULATORY_CARE_PROVIDER_SITE_OTHER): Payer: BLUE CROSS/BLUE SHIELD | Admitting: Neurology

## 2017-07-23 DIAGNOSIS — R519 Headache, unspecified: Secondary | ICD-10-CM

## 2017-07-23 DIAGNOSIS — G4733 Obstructive sleep apnea (adult) (pediatric): Secondary | ICD-10-CM

## 2017-07-23 DIAGNOSIS — R51 Headache: Secondary | ICD-10-CM

## 2017-07-23 DIAGNOSIS — G4761 Periodic limb movement disorder: Secondary | ICD-10-CM

## 2017-07-23 DIAGNOSIS — Z6841 Body Mass Index (BMI) 40.0 and over, adult: Secondary | ICD-10-CM

## 2017-07-23 DIAGNOSIS — G4719 Other hypersomnia: Secondary | ICD-10-CM

## 2017-07-23 DIAGNOSIS — Z9989 Dependence on other enabling machines and devices: Secondary | ICD-10-CM | POA: Diagnosis not present

## 2017-07-23 DIAGNOSIS — Z820 Family history of epilepsy and other diseases of the nervous system: Secondary | ICD-10-CM

## 2017-07-24 ENCOUNTER — Other Ambulatory Visit: Payer: Self-pay | Admitting: Neurology

## 2017-07-24 ENCOUNTER — Telehealth: Payer: Self-pay

## 2017-07-24 DIAGNOSIS — G4733 Obstructive sleep apnea (adult) (pediatric): Secondary | ICD-10-CM

## 2017-07-24 NOTE — Telephone Encounter (Signed)
I called pt. I advised pt that Dr. Frances FurbishAthar reviewed their sleep study results and found that pt has severe osa and did very well with the cpap during the study with significant improvement of respiratory events. Dr. Frances FurbishAthar recommends that pt start a new cpap at home. I reviewed PAP compliance expectations with the pt. Pt is agreeable to starting a CPAP. I advised pt that an order will be sent to a DME, [facility], and [facility] will call the pt within about one week after they file with the pt's insurance. [facility] will show the pt how to use the machine, fit for masks, and troubleshoot the CPAP if needed. A follow up appt was made for insurance purposes with Dr. Frances FurbishAthar on 10/21/17 at 9:30am. Pt verbalized understanding to arrive 15 minutes early and bring their CPAP. A letter with all of this information in it will be sent to the pt's mychart account as a reminder. I verified with the pt that the address we have on file is correct. Pt verbalized understanding of results. Pt had no questions at this time but was encouraged to call back if questions arise.

## 2017-07-24 NOTE — Procedures (Signed)
PATIENT'S NAME:  Jack QuartoBuehler, Zakyria P DOB:      Aug 19, 1970      MR#:    [id number]     DATE OF RECORDING: 07/23/2017 REFERRING M.D.:  Illa LevelSahar Osman, PA-C Study Performed:  Split-Night Titration Study HISTORY: 47 year old right-handed woman with an underlying medical history of hypertension, diabetes, asthma, anemia, vitamin D deficiency, migraine headaches, IBS, hypothyroidism, reflux disease, depression, and morbid obesity with a BMI of over 45, who was previously diagnosed with obstructive sleep apnea and needs re-evaluation. She has an older CPAP machine. The patient endorsed the Epworth Sleepiness Scale at 18/24 points. The patient's weight 301 pounds with a height of 66 (inches), resulting in a BMI of 48.5 kg/m2. The patient's neck circumference measured 17.5 inches.  CURRENT MEDICATIONS: Proventil, Wellbutrin, Klonopin, Synthroid, Glucophage, Singulair, Zoloft, Imitrex, Drisdol.  PROCEDURE:  This is a multichannel digital polysomnogram utilizing the Somnostar 11.2 system.  Electrodes and sensors were applied and monitored per AASM Specifications.   EEG, EOG, Chin and Limb EMG, were sampled at 200 Hz.  ECG, Snore and Nasal Pressure, Thermal Airflow, Respiratory Effort, CPAP Flow and Pressure, Oximetry was sampled at 50 Hz. Digital video and audio were recorded.      BASELINE STUDY WITHOUT CPAP RESULTS:  Lights Out was at 22:25 and Lights On at 05:07 for the night, split start at 01:06, epoch 329. Total recording time (TRT) was 161, with a total sleep time (TST) of 118.5 minutes. The patient's sleep latency was 29.5 minutes. REM sleep was absent. The sleep efficiency was 73.6 %.    SLEEP ARCHITECTURE: WASO (Wake after sleep onset) was 13.5 minutes, Stage N1 was 14.5 minutes, Stage N2 was 104 minutes, Stage N3 was 0 minutes and Stage R (REM sleep) was 0 minutes.  The percentages were Stage N1 12.2%, Stage N2 87.8%, both increased and Stage N3 and Stage R (REM sleep) were absent. The arousals were  noted as: 32 were spontaneous, 5 were associated with PLMs, 64 were associated with respiratory events.  Audio and video analysis did not show any abnormal or unusual movements, behaviors, phonations or vocalizations. The patient took 1 bathroom break. Moderate to loud snoring was noted. The EKG was in keeping with normal sinus rhythm (NSR).   RESPIRATORY ANALYSIS:  There were a total of 147 respiratory events:  67 obstructive apneas, 1 central apneas and 0 mixed apneas with a total of 68 apneas and an apnea index (AI) of 34.4. There were 79 hypopneas with a hypopnea index of 40. The patient also had 0 respiratory event related arousals (RERAs).  Snoring was noted.     The total APNEA/HYPOPNEA INDEX (AHI) was 74.4 /hour and the total RESPIRATORY DISTURBANCE INDEX was 74.4 /hour.  0 events occurred in REM sleep and 159 events in NREM. The REM AHI was n/a, /hour versus a non-REM AHI of 74.4 /hour. The patient spent 284 minutes sleep time in the supine position 64 minutes in non-supine. The supine AHI was 60.5 /hour versus a non-supine AHI of 86.3 /hour.  OXYGEN SATURATION & C02:  The wake baseline 02 saturation was 98%, with the lowest being 87%. Time spent below 89% saturation equaled 1 minutes.  PERIODIC LIMB MOVEMENTS: The patient had a total of 24 Periodic Limb Movements.  The Periodic Limb Movement (PLM) index was 12.2 /hour and the PLM Arousal index was 2.5 /hour.  TITRATION STUDY WITH CPAP RESULTS:   The patient was fitted with a small F&P Simplus FFM. CPAP was initiated at 5 cmH20  with heated humidity per AASM split night standards and pressure was advanced to 13 cmH20 because of hypopneas, apneas and desaturations.  At a PAP pressure of 13 cmH20, there was a reduction of the AHI to 0/hour with supine REM sleep achieved and O2 nadir of 91%.   Total recording time (TRT) was 241.5 minutes, with a total sleep time (TST) of 229.5 minutes. The patient's sleep latency was 7 minutes. REM latency was  97 minutes. The sleep efficiency was 95%.    SLEEP ARCHITECTURE: Wake after sleep was 6 minutes, Stage N1 3.5 minutes, Stage N2 91.5 minutes, Stage N3 75 minutes and Stage R (REM sleep) 59.5 minutes. The percentages were: Stage N1 1.5%, Stage N2 39.9%, Stage N3 32.7%, which is increased, and Stage R (REM sleep) 25.9%, which is normal. The arousals were noted as: 8 were spontaneous, 1 were associated with PLMs, 5 were associated with respiratory events.  RESPIRATORY ANALYSIS:  There were a total of 42 respiratory events: 24 obstructive apneas, 2 central apneas and 0 mixed apneas with a total of 26 apneas and an apnea index (AI) of 6.8. There were 16 hypopneas with a hypopnea index of 4.2 /hour. The patient also had 0 respiratory event related arousals (RERAs).      The total APNEA/HYPOPNEA INDEX  (AHI) was 11. /hour and the total RESPIRATORY DISTURBANCE INDEX was 11. /hour.  1 events occurred in REM sleep and 41 events in NREM. The REM AHI was 1. /hour versus a non-REM AHI of 14.5 /hour. REM sleep was achieved on a pressure of  cm/h2o (AHI was  .) The patient spent 100% of total sleep time in the supine position. The supine AHI was 11.0 /hour, versus a non-supine AHI of 0.0/hour.  OXYGEN SATURATION & C02:  The wake baseline 02 saturation was 97%, with the lowest being 88%. Time spent below 89% saturation equaled 0 minutes.  PERIODIC LIMB MOVEMENTS: The patient had a total of 134 Periodic Limb Movements. The Periodic Limb Movement (PLM) index was 35. /hour and the PLM Arousal index was .3 /hour.  Post-study, the patient indicated that sleep was the same as usual (better with CPAP).  POLYSOMNOGRAPHY IMPRESSION :   1. Obstructive Sleep Apnea (OSA)  2. Periodic Limb Movement Disorder (PLMD)  RECOMMENDATIONS:  1. This patient has severe obstructive sleep apnea and responded well on CPAP therapy. Of note, the absence of REM sleep during the baseline portion of the study likely underestimates her AHI  and O2 nadir. I will recommend a home treatment pressure of 13 cm via small FFM, with heated humidity. I will prescribe a new CPAP machine. The patient should be reminded to be fully compliant with PAP therapy to improve sleep related symptoms and decrease long term cardiovascular risks. Please note that untreated obstructive sleep apnea carries additional perioperative morbidity. Patients with significant obstructive sleep apnea should receive perioperative PAP therapy and the surgeons and particularly the anesthesiologist should be informed of the diagnosis and the severity of the sleep disordered breathing. 2. Moderate PLMs (periodic limb movements of sleep) were noted during this study with no significant arousals; clinical correlation is recommended. Medication effect from the antidepressant medication should be considered.  3. The patient should be cautioned not to drive, work at heights, or operate dangerous or heavy equipment when tired or sleepy. Review and reiteration of good sleep hygiene measures should be pursued with any patient. 4. The patient will be seen in follow-up by Dr. Frances Furbish at [facility] for discussion of  the test results and further management strategies. The referring provider will be notified of the test results.  I certify that I have reviewed the entire raw data recording prior to the issuance of this report in accordance with the Standards of Accreditation of the American Academy of Sleep Medicine (AASM)   Huston FoleySaima Diedre Maclellan, MD, PhD Diplomat, American Board of Psychiatry and Neurology (Neurology and Sleep Medicine)

## 2017-07-24 NOTE — Telephone Encounter (Signed)
-----   Message from Huston FoleySaima Athar, MD sent at 07/24/2017  8:41 AM EST ----- Patient referred by Amanda LevelSahar Osman, PA, seen by me on 06/23/17, split night sleep study on 07/23/17. Please call and notify patient that the recent sleep study confirmed her prior Dx of severe OSA. She did very well with CPAP during the study with significant improvement of the respiratory events. Therefore, I will prescribe a new CPAP and we can send to her existing DME or new DME co if needed.  Please advise patient that we need a follow up appointment with either myself or one of our nurse practitioners in about 10 weeks post set-up to check for how the patient is feeling and how well the patient is using the machine, etc. Please go ahead and schedule the appointment, while you have the patient on the phone and make sure patient understands the importance of keeping this window for the FU appointment, as it is often an insurance requirement. Failing to adhere to this may result in losing coverage for sleep apnea treatment, at which point most patients are left with a choice of returning the machine or paying out of pocket (and we want neither of this to happen!).  Please re-enforce the importance of compliance with treatment and the need for us to monitor compliance data - again an insurance requirement and usually a good feedback for the patient as far as how they are doing.  Also remind patient, that any PAP machine or mask issues should be first addressed with the DME company, who provided the machine/mask.  Please ask if patient has a preference regarding DME company, may depend on the insurance too.  Please arrange for CPAP set up at home through a DME company of patient's choice.  Once you have spoken to the patient you can close the phone encounter. Please fax/route report to referring provider, thanks,   Huston FoleySaima Athar, MD, PhD [facility])

## 2017-07-24 NOTE — Progress Notes (Signed)
Patient referred by Illa LevelSahar Osman, PA, seen by me on 06/23/17, split night sleep study on 07/23/17. Please call and notify patient that the recent sleep study confirmed her prior Dx of severe OSA. She did very well with CPAP during the study with significant improvement of the respiratory events. Therefore, I will prescribe a new CPAP and we can send to her existing DME or new DME co if needed.  Please advise patient that we need a follow up appointment with either myself or one of our nurse practitioners in about 10 weeks post set-up to check for how the patient is feeling and how well the patient is using the machine, etc. Please go ahead and schedule the appointment, while you have the patient on the phone and make sure patient understands the importance of keeping this window for the FU appointment, as it is often an insurance requirement. Failing to adhere to this may result in losing coverage for sleep apnea treatment, at which point most patients are left with a choice of returning the machine or paying out of pocket (and we want neither of this to happen!).  Please re-enforce the importance of compliance with treatment and the need for us to monitor compliance data - again an insurance requirement and usually a good feedback for the patient as far as how they are doing.  Also remind patient, that any PAP machine or mask issues should be first addressed with the DME company, who provided the machine/mask.  Please ask if patient has a preference regarding DME company, may depend on the insurance too.  Please arrange for CPAP set up at home through a DME company of patient's choice.  Once you have spoken to the patient you can close the phone encounter. Please fax/route report to referring provider, thanks,   Huston FoleySaima Marlean Mortell, MD, PhD [facility])

## 2017-09-15 DIAGNOSIS — G4733 Obstructive sleep apnea (adult) (pediatric): Secondary | ICD-10-CM | POA: Diagnosis not present

## 2017-10-14 ENCOUNTER — Other Ambulatory Visit: Payer: Self-pay | Admitting: Physician Assistant

## 2017-10-15 DIAGNOSIS — G4733 Obstructive sleep apnea (adult) (pediatric): Secondary | ICD-10-CM | POA: Diagnosis not present

## 2017-10-18 ENCOUNTER — Other Ambulatory Visit: Payer: Self-pay | Admitting: Physician Assistant

## 2017-10-19 ENCOUNTER — Encounter: Payer: Self-pay | Admitting: Neurology

## 2017-10-21 ENCOUNTER — Ambulatory Visit (INDEPENDENT_AMBULATORY_CARE_PROVIDER_SITE_OTHER): Payer: BLUE CROSS/BLUE SHIELD | Admitting: Neurology

## 2017-10-21 ENCOUNTER — Encounter: Payer: Self-pay | Admitting: Neurology

## 2017-10-21 VITALS — BP 118/75 | HR 69 | Ht 66.0 in | Wt 299.0 lb

## 2017-10-21 DIAGNOSIS — G4733 Obstructive sleep apnea (adult) (pediatric): Secondary | ICD-10-CM

## 2017-10-21 DIAGNOSIS — Z9989 Dependence on other enabling machines and devices: Secondary | ICD-10-CM | POA: Diagnosis not present

## 2017-10-21 NOTE — Patient Instructions (Addendum)
Please continue using your CPAP regularly. While your insurance requires that you use CPAP at least 4 hours each night on 70% of the nights, I recommend, that you not skip any nights and use it throughout the night if you can. Getting used to CPAP and staying with the treatment long term does take time and patience and discipline. Untreated obstructive sleep apnea when it is moderate to severe can have an adverse impact on cardiovascular health and raise her risk for heart disease, arrhythmias, hypertension, congestive heart failure, stroke and diabetes. Untreated obstructive sleep apnea causes sleep disruption, nonrestorative sleep, and sleep deprivation. This can have an impact on your day to day functioning and cause daytime sleepiness and impairment of cognitive function, memory loss, mood disturbance, and problems focussing. Using CPAP regularly can improve these symptoms.  You have done a great job using your CPAP! Keep up the good work! We will see you back in 6 months for sleep apnea check up. You can see one of our nurse practitioners as you are stable. I will see you after that.

## 2017-10-21 NOTE — Progress Notes (Signed)
Subjective:    Patient ID: Amanda Davidson is a 47 y.o. female.  HPI     Interim history:   Amanda Davidson is a 47 year old right-handed woman with an underlying medical history of hypertension, diabetes, asthma, anemia, vitamin D deficiency, migraine headaches, IBS, hypothyroidism, reflux disease, depression, and morbid obesity with a BMI of over 47, who presents for follow-up consultation of her obstructive sleep apnea, after a recent split-night sleep study. The patient is unaccompanied today. I first met her on 06/23/2017 at the request of her medical weight loss clinic, at which time the patient reported a prior diagnosis of obstructive sleep apnea, she had an older CPAP machine which she was still using. She had an Epworth sleepiness score of 18 at the time. I suggested we proceed with sleep study testing as prior sleep study results were not available. She had a split-night sleep study on 07/23/2017. I went over her test results with her in detail today. Baseline sleep efficiency was 73.6%, sleep latency 29.5 minutes, REM sleep was absent. She had a total AHI of 74.4 per hour, average oxygen saturation of 98%, nadir of 87% with the absence of REM sleep. She had no significant PLMS. She was fitted with a small full face mask. CPAP was titrated from 5 cm to 13 cm. On the final pressure her AHI was 0 per hour. Supine REM sleep achieved an O2 nadir of 91%. She achieved a significant percentage of slow-wave sleep at 32.7 and REM sleep was 25.9%. She had moderate PLMS with no significant arousals. Based on her test results I prescribed a new CPAP machine at a pressure of 13 cm.  Today, 10/21/2017: I reviewed her CPAP compliance data from 09/20/2017 through 10/19/2017 which is a total of 30 days, during which time she used her CPAP every night with percent used days greater than 4 hours at 100%, indicating superb compliance with an average usage of 6 hours and 54 minutes, residual AHI borderline at 6.3  per hour, leak on the low side with the 95th percentile at 7.3 L/m on a pressure of 13 cm with EPR of 3. She reports doing well with her CPAP machine, she has a new CPAP and is fully compliant with treatment, indicates good results. She is still struggling with some mask discomfort. She tried to use nasal pillows which were uncomfortable, she tried a nasal mask which did not work well for her and eventually went back to using a fullface mask. She is working on weight loss.she has had issues with mouth dryness but is now using Biotene.  The patient's allergies, current medications, family history, past medical history, past social history, past  surgical history and problem list were reviewed and updated as appropriate.   Previously (copied from previous notes for reference):   06/23/2017: (She) was previously diagnosed with obstructive sleep apnea. She has an older CPAP machine. I reviewed her CPAP download from 05/24/2017 through 06/22/2017, which is a total of 30 days, during which time she used her machine 26 days with percent used days greater than 4 hours at 80%, indicating very good compliance with an average usage of 6 hours and 23 minutes, residual AHI 3 per hour, set pressure at 12 cm, leak on the low side. She endorses significant daytime somnolence, her Epworth sleepiness score is 18 out of 24, fatigue score is 35 out of 63. She does not have a copy of her sleep study, she is not currently plugged in with a local  DME company. She had her PSG in [place], over 10 years ago. She believes, her DME may have been [facility]. We called the local [place] office, but they do not have any records on her. She works as a Water quality scientist. Bedtime is around 10 or 11 PM, rise time around 5:30. She does not have night to night nocturia, she has had occasional morning headaches and has a history of migraines. She has a family history of OSA in her father. She is divorced, lives with her 2 children, 25 year old son  and 82 year old daughter. She has had increase in stress in the past 2 years, she has had weight gain in the past several years. She has a TV in her bedroom but does not watch it typically at night. She tries to maintain good sleep hygiene. She is a nonsmoker, does not currently drink alcohol, drinks caffeine in limitation, typically 1 cup of coffee in the morning. She has a cat and a dog in her bedroom, occasionally on her bed which is a king size bed.  Her Past Medical History Is Significant For: Past Medical History:  Diagnosis Date  . Anemia   . Asthma   . Complication of anesthesia    NAUSEA  . Depression   . Diabetes mellitus without complication ([place])   . Drug use   . Exercise-induced asthma   . GERD (gastroesophageal reflux disease)   . Headache(784.0)    MIGRAINES  . Hypertension   . Hypothyroidism   . IBS (irritable bowel syndrome)   . Migraines   . Obesity   . Post-nasal drip   . Shortness of breath   . Sleep apnea    STOPPPED USING C-PAP 6 MO AGO  . Vitamin D deficiency     Her Past Surgical History Is Significant For: Past Surgical History:  Procedure Laterality Date  . HERNIA REPAIR    . LAPAROSCOPIC GASTRIC SLEEVE RESECTION N/A 05/14/2013   Procedure: LAPAROSCOPIC GASTRIC SLEEVE RESECTION;  Surgeon: Pedro Earls, MD;  Location: [facility];  Service: General;  Laterality: N/A;  . UMBILICAL HERNIA REPAIR  05/14/2013   Procedure: HERNIA REPAIR UMBILICAL ADULT;  Surgeon: Pedro Earls, MD;  Location: [facility];  Service: General;;  . UPPER GI ENDOSCOPY  05/14/2013   Procedure: UPPER GI ENDOSCOPY;  Surgeon: Pedro Earls, MD;  Location: [facility];  Service: General;;    Her Family History Is Significant For: Family History  Problem Relation Age of Onset  . Hypertension Mother   . Heart disease Mother   . Depression Mother   . Hypertension Father   . Diabetes Father   . Sleep apnea Father   . Heart disease Maternal Grandfather   . Heart attack Maternal  Grandfather   . Cancer Maternal Grandfather        bladder  . Hyperlipidemia Other   . Sleep apnea Other     Her Social History Is Significant For: Social History   Socioeconomic History  . Marital status: Divorced    Spouse name: Not on file  . Number of children: Not on file  . Years of education: Not on file  . Highest education level: Not on file  Occupational History  . Occupation: Water quality scientist  Social Needs  . Financial resource strain: Not on file  . Food insecurity:    Worry: Not on file    Inability: Not on file  . Transportation needs:    Medical: Not on file    Non-medical: Not  on file  Tobacco Use  . Smoking status: Never Smoker  . Smokeless tobacco: Never Used  Substance and Sexual Activity  . Alcohol use: Yes    Alcohol/week: 0.5 oz    Types: 1 drink(s) per week    Comment: 2 glasses wine @ 2x/month  . Drug use: No  . Sexual activity: Not on file  Lifestyle  . Physical activity:    Days per week: Not on file    Minutes per session: Not on file  . Stress: Not on file  Relationships  . Social connections:    Talks on phone: Not on file    Gets together: Not on file    Attends religious service: Not on file    Active member of club or organization: Not on file    Attends meetings of clubs or organizations: Not on file    Relationship status: Not on file  Other Topics Concern  . Not on file  Social History Narrative  . Not on file    Her Allergies Are:  No Known Allergies:   Her Current Medications Are:  Outpatient Encounter Medications as of 10/21/2017  Medication Sig  . albuterol (PROVENTIL HFA;VENTOLIN HFA) 108 (90 Base) MCG/ACT inhaler Inhale 2 puffs into lungs every 6 hours as needed for wheezing  . clonazePAM (KLONOPIN) 0.5 MG tablet TAKE ONE TABLET BY MOUTH TWO TIMES DAILY AS NEEDED FOR ANXIETY  . levothyroxine (SYNTHROID, LEVOTHROID) 137 MCG tablet TAKE ONE TABLET BY MOUTH EVERY DAY BEFORE BREAKFAST  . montelukast (SINGULAIR) 10 MG  tablet TAKE 1 TABLET (10 MG TOTAL) BY MOUTH AT BEDTIME.  Marland Kitchen sertraline (ZOLOFT) 50 MG tablet Take 1 tablet (50 mg total) by mouth daily.  . SUMAtriptan (IMITREX) 100 MG tablet Take 1 tablet (100 mg total) by mouth every 2 (two) hours as needed for migraine. LAST REFILL.PLEASE SCHEDULE AN APPOINTMENT  . Vitamin D, Ergocalciferol, (DRISDOL) 50000 units CAPS capsule Take 1 capsule (50,000 Units total) by mouth every 7 (seven) days.  . [DISCONTINUED] buPROPion (WELLBUTRIN SR) 150 MG 12 hr tablet Take 1 tablet (150 mg total) by mouth daily.  . [DISCONTINUED] metFORMIN (GLUCOPHAGE) 500 MG tablet Take 1 tablet (500 mg total) by mouth 2 (two) times daily with a meal.   No facility-administered encounter medications on file as of 10/21/2017.   :  Review of Systems:  Out of a complete 14 point review of systems, all are reviewed and negative with the exception of these symptoms as listed below: Review of Systems  Neurological:       Pt presents today to discuss her cpap. Pt reports that her cpap is going well, except she is trying to find a good mask.    Objective:  Neurological Exam  Physical Exam Physical Examination:   Vitals:   10/21/17 0937  BP: 118/75  Pulse: 69    General Examination: The patient is a very pleasant 46 y.o. female in no acute distress. She appears well-developed and well-nourished and well groomed.   HEENT: Normocephalic, atraumatic, pupils are equal, round and reactive to light and accommodation. Extraocular tracking is good without limitation to gaze excursion or nystagmus noted. Normal smooth pursuit is noted. Hearing is grossly intact. Face is symmetric with normal facial animation and normal facial sensation. Speech is clear with no dysarthria noted. There is no hypophonia. There is no lip, neck/head, jaw or voice tremor. Neck is supple with full range of passive and active motion. There are no carotid bruits on auscultation. Oropharynx  exam reveals: mild to moderate  mouth dryness, adequate dental hygiene and moderate airway crowding. Tongue protrudes centrally and palate elevates symmetrically. Neck size is 17 7/8 inches.   Chest: Clear to auscultation without wheezing, rhonchi or crackles noted.  Heart: S1+S2+0, regular and normal without murmurs, rubs or gallops noted.   Abdomen: Soft, non-tender and non-distended with normal bowel sounds appreciated on auscultation.  Extremities: There is trace pitting edema in the distal lower extremities bilaterally. Pedal pulses are intact.  Skin: Warm and dry without trophic changes noted.  Musculoskeletal: exam reveals no obvious joint deformities, tenderness or joint swelling or erythema.   Neurologically:  Mental status: The patient is awake, alert and oriented in all 4 spheres. Her immediate and remote memory, attention, language skills and fund of knowledge are appropriate. There is no evidence of aphasia, agnosia, apraxia or anomia. Speech is clear with normal prosody and enunciation. Thought process is linear. Mood is normal and affect is normal.  Cranial nerves II - XII are as described above under HEENT exam. In addition: shoulder shrug is normal with equal shoulder height noted. Motor exam: Normal bulk, strength and tone is noted. There is no drift, tremor or rebound. Romberg is negative. Fine motor skills and coordination: intact with normal finger taps, normal hand movements, normal rapid alternating patting, normal foot taps and normal foot agility.  Cerebellar testing: No dysmetria or intention tremor. There is no truncal or gait ataxia.  Sensory exam: intact to light touch the upper and lower extremities.  Gait, station and balance: She stands easily. No veering to one side is noted. No leaning to one side is noted. Posture is age-appropriate and stance is narrow based. Gait shows normal stride length and normal pace. No problems turning are noted. Tandem walk is unremarkable.  Assessment and  Plan:  In summary, Vinie P Mozingo is a very pleasant 47 year old female with an underlying medical history of hypertension, diabetes, asthma, anemia, vitamin D deficiency, migraine headaches, IBS, hypothyroidism, reflux disease, depression, and morbid obesity, who presents for follow-up consultation of her severe obstructive sleep apnea, after sleep study testing with a split-night sleep study on 07/23/2017. She carries a prior diagnosis of moderate obstructive sleep apnea and had sleep study testing initially over 10 years ago, had an older CPAP machine. She has established treatment with her new CPAP machine and is fully compliant with treatment. Which she is highly commended. She is using a fullface mask and and had hoped to try a nasal mask or nasal pillows but had to settle on the fullface mouth. For mouth dryness she has been using Biotene and she tries to hydrate better with water. She is trying to lose weight. She is motivated to work on it harder. She is fairly stable is far as weight goes at this point. She has had interim stressors which are thankfully a little less. She is commended for treatment adherence and her weight loss endeavors, I suggested a six-month follow-up with one of our nurse practitioners for routine recheck on her sleep apnea. She can be followed on a yearly basis hopefully after that. We talked about her sleep study results in detail today and reviewed her compliance data together as well. I answered all her questions today and she was in agreement.

## 2017-10-22 ENCOUNTER — Encounter: Payer: Self-pay | Admitting: Physician Assistant

## 2017-10-22 ENCOUNTER — Ambulatory Visit (INDEPENDENT_AMBULATORY_CARE_PROVIDER_SITE_OTHER): Payer: BLUE CROSS/BLUE SHIELD | Admitting: Physician Assistant

## 2017-10-22 VITALS — BP 135/64 | HR 70 | Ht 66.0 in | Wt 299.0 lb

## 2017-10-22 DIAGNOSIS — G4733 Obstructive sleep apnea (adult) (pediatric): Secondary | ICD-10-CM | POA: Diagnosis not present

## 2017-10-22 DIAGNOSIS — I10 Essential (primary) hypertension: Secondary | ICD-10-CM | POA: Diagnosis not present

## 2017-10-22 DIAGNOSIS — G43009 Migraine without aura, not intractable, without status migrainosus: Secondary | ICD-10-CM

## 2017-10-22 DIAGNOSIS — F4323 Adjustment disorder with mixed anxiety and depressed mood: Secondary | ICD-10-CM

## 2017-10-22 DIAGNOSIS — R7303 Prediabetes: Secondary | ICD-10-CM

## 2017-10-22 DIAGNOSIS — M65342 Trigger finger, left ring finger: Secondary | ICD-10-CM | POA: Diagnosis not present

## 2017-10-22 DIAGNOSIS — E559 Vitamin D deficiency, unspecified: Secondary | ICD-10-CM | POA: Diagnosis not present

## 2017-10-22 DIAGNOSIS — E039 Hypothyroidism, unspecified: Secondary | ICD-10-CM

## 2017-10-22 DIAGNOSIS — J4540 Moderate persistent asthma, uncomplicated: Secondary | ICD-10-CM

## 2017-10-22 DIAGNOSIS — Z23 Encounter for immunization: Secondary | ICD-10-CM

## 2017-10-22 DIAGNOSIS — F418 Other specified anxiety disorders: Secondary | ICD-10-CM

## 2017-10-22 LAB — POCT GLYCOSYLATED HEMOGLOBIN (HGB A1C): HEMOGLOBIN A1C: 5.9 % — AB (ref 4.0–5.6)

## 2017-10-22 MED ORDER — LIRAGLUTIDE 18 MG/3ML ~~LOC~~ SOPN: PEN_INJECTOR | SUBCUTANEOUS | 2 refills | Status: DC

## 2017-10-22 MED ORDER — LIRAGLUTIDE -WEIGHT MANAGEMENT 18 MG/3ML ~~LOC~~ SOPN: 0.6000 mg | PEN_INJECTOR | Freq: Every day | SUBCUTANEOUS | 1 refills | Status: DC

## 2017-10-22 NOTE — Patient Instructions (Addendum)
Trigger Finger Trigger finger (stenosing tenosynovitis) is a condition that causes a finger to get stuck in a bent position. Each finger has a tough, cord-like tissue that connects muscle to bone (tendon), and each tendon is surrounded by a tunnel of tissue (tendon sheath). To move your finger, your tendon needs to slide freely through the sheath. Trigger finger happens when the tendon or the sheath thickens, making it difficult to move your finger. Trigger finger can affect any finger or a thumb. It may affect more than one finger. Mild cases may clear up with rest and medicine. Severe cases require more treatment. What are the causes? Trigger finger is caused by a thickened finger tendon or tendon sheath. The cause of this thickening is not known. What increases the risk? The following factors may make you more likely to develop this condition:  Doing activities that require a strong grip.  Having rheumatoid arthritis, gout, or diabetes.  Being 40-60 years old.  Being a woman.  What are the signs or symptoms? Symptoms of this condition include:  Pain when bending or straightening your finger.  Tenderness or swelling where your finger attaches to the palm of your hand.  A lump in the palm of your hand or on the inside of your finger.  Hearing a popping sound when you try to straighten your finger.  Feeling a popping, catching, or locking sensation when you try to straighten your finger.  Being unable to straighten your finger.  How is this diagnosed? This condition is diagnosed based on your symptoms and a physical exam. How is this treated? This condition may be treated by:  Resting your finger and avoiding activities that make symptoms worse.  Wearing a finger splint to keep your finger in a slightly bent position.  Taking NSAIDs to relieve pain and swelling.  Injecting medicine (steroids) into the tendon sheath to reduce swelling and irritation. Injections may need to be  repeated.  Having surgery to open the tendon sheath. This may be done if other treatments do not work and you cannot straighten your finger. You may need physical therapy after surgery.  Follow these instructions at home:  Use moist heat to help reduce pain and swelling as told by your health care provider.  Rest your finger and avoid activities that make pain worse. Return to normal activities as told by your health care provider.  If you have a splint, wear it as told by your health care provider.  Take over-the-counter and prescription medicines only as told by your health care provider.  Keep all follow-up visits as told by your health care provider. This is important. Contact a health care provider if:  Your symptoms are not improving with home care. Summary  Trigger finger (stenosing tenosynovitis) causes your finger to get stuck in a bent position, and it can make it difficult and painful to straighten your finger.  This condition develops when a finger tendon or tendon sheath thickens.  Treatment starts with resting, wearing a splint, and taking NSAIDs.  In severe cases, surgery to open the tendon sheath may be needed. This information is not intended to replace advice given to you by your health care provider. Make sure you discuss any questions you have with your health care provider. Document Released: 03/09/2004 Document Revised: 04/30/2016 Document Reviewed: 04/30/2016 Elsevier Interactive Patient Education  2017 [employer] Inc[employer]  

## 2017-10-22 NOTE — Progress Notes (Signed)
Subjective:    Patient ID: Amanda Davidson, female    Amanda Davidson, 47 y.o.   MRN: [id number]  HPI  Pt is a 47 yo pleasant female with elevated glucose, asthma, hypothyroidism,  HTN, migraines, OSA who presents to the clinic for follow up.   Her sugars have been elevated at times. She does not check them at home. Her daughter has been battling anorexia and she has not been taking care of her own health. She had gastric sleeve surgery done but has been gaining weight over last year. Phentermine works but she gains the weight back. She plans to start eating better and exercising soon.   She is back on CPAP for sleep apnea. She feels much more energy.   Asthma-controlled with albuterol at bedtime and singulair.   Hypothyroidism-no concerns but needs labs.   Depression and anxiety doing well. zoloft helping. No SI/HC.   Migraines- needs refill on imitrex. She is having migraines 1-4 times a month. Not on prevenative.   She does C/O left ring finger "catching" when she tries to open fist. She bought a finger brace which helps some. It does seem to be hurting more.   .. Active Ambulatory Problems    Diagnosis Date Noted  . Sleep apnea 05/24/2012  . Hypothyroidism 05/24/2012  . Migraine 05/24/2012  . Vitamin D deficiency 05/24/2012  . Asthma 05/24/2012  . Depression with anxiety 05/24/2012  . Hypertension, essential, benign 06/30/2012  . Asthma, moderate persistent 09/18/2012  . Morbid obesity (HCC) 09/18/2012  . Laparoscopic sleeve gastrectomy Dec 2014 05/14/2013  . Insulin resistance 05/24/2013  . Trigger finger, left ring finger 10/23/2017  . Adjustment disorder with mixed anxiety and depressed mood 10/23/2017   Resolved Ambulatory Problems    Diagnosis Date Noted  . Diabetes mellitus, type 2 (HCC) 09/18/2012  . Type 2 diabetes mellitus without complication, without long-term current use of insulin (HCC) 05/19/2017   Past Medical History:  Diagnosis Date  . Anemia    . Asthma   . Complication of anesthesia   . Depression   . Diabetes mellitus without complication (HCC)   . Drug use   . Exercise-induced asthma   . GERD (gastroesophageal reflux disease)   . Headache(784.0)   . Hypertension   . Hypothyroidism   . IBS (irritable bowel syndrome)   . Migraines   . Obesity   . Post-nasal drip   . Shortness of breath   . Sleep apnea   . Vitamin D deficiency       Review of Systems  All other systems reviewed and are negative.      Objective:   Physical Exam  Constitutional: She is oriented to person, place, and time. She appears well-developed and well-nourished.  Obese.   HENT:  Head: Normocephalic and atraumatic.  Cardiovascular: Normal rate and regular rhythm.  Pulmonary/Chest: Effort normal and breath sounds normal.  Musculoskeletal:  Left ring finger clicking and catching with opening and closing of fist.  Neurological: She is alert and oriented to person, place, and time.  Psychiatric: She has a normal mood and affect. Her behavior is normal.          Assessment & Plan:  Marland KitchenMarland KitchenMelissa was seen today for hypothyroidism.  Diagnoses and all orders for this visit:  Pre-diabetes -     POCT HgB A1C -     COMPLETE METABOLIC PANEL WITH GFR  Hypertension, essential, benign -     COMPLETE METABOLIC PANEL WITH GFR  Migraine without  aura and without status migrainosus, not intractable -     SUMAtriptan (IMITREX) 100 MG tablet; Take 1 tablet (100 mg total) by mouth every 2 (two) hours as needed for migraine.  Moderate persistent asthma without complication -     montelukast (SINGULAIR) 10 MG tablet; Take one tablet at bedtime.  Acquired hypothyroidism -     TSH -     COMPLETE METABOLIC PANEL WITH GFR  Vitamin D deficiency -     Vitamin D 1,25 dihydroxy  Obstructive sleep apnea syndrome  Trigger finger, left ring finger  Need for Tdap vaccination -     Tdap vaccine greater than or equal to 49yo IM  Depression with  anxiety -     sertraline (ZOLOFT) 50 MG tablet; Take 1 tablet (50 mg total) by mouth daily.  Adjustment disorder with mixed anxiety and depressed mood -     sertraline (ZOLOFT) 50 MG tablet; Take 1 tablet (50 mg total) by mouth daily.  Morbid obesity (HCC)  Other orders -     Discontinue: Liraglutide -Weight Management (SAXENDA) 18 MG/3ML SOPN; Inject 0.6 mg into the skin daily. For one week then increase by 49 weekly until reaches 49 daily.  Please include ultra fine needles 6mm  .Marland Kitchen Depression screen Amanda Davidson 2/9 10/22/2017 02/18/2017 09/19/2016  Decreased Interest 1 2 0  Down, Depressed, Hopeless 1 2 0  PHQ - 2 Score 2 4 0  Altered sleeping 1 3 -  Tired, decreased energy 2 3 -  Change in appetite 2 2 -  Feeling bad or failure about yourself  1 3 -  Trouble concentrating 1 3 -  Moving slowly or fidgety/restless 0 2 -  Suicidal thoughts 0 0 -  PHQ-9 Score 9 20 -  Difficult doing work/chores Somewhat difficult - -   .Marland Kitchen GAD 7 : Generalized Anxiety Score 10/22/2017  Nervous, Anxious, on Edge 1  Control/stop worrying 1  Worry too much - different things 1  Trouble relaxing 0  Restless 0  Easily annoyed or irritable 1  Afraid - awful might happen 0  Total GAD 7 Score 4  Anxiety Difficulty Not difficult at all     .Marland Kitchen Lab Results  Component Value Date   HGBA1C 5.9 (A) 10/22/2017   A!C great today.  Discussed weight.Marland KitchenMarland KitchenDiscussed low carb diet with 1500 calories and 80g of protein.  Exercising at least 150 minutes a week.  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  Added saxenda. Discussed side effects. Follow up in 1 month.   Order labs to follow up on hypothyroidism  Refilled medications. Overall doing well.  Pt has trigger finger. HO given. Discussed NSAID. Needs injection with sports medicine. Made referral in office.

## 2017-10-23 ENCOUNTER — Encounter: Payer: Self-pay | Admitting: Physician Assistant

## 2017-10-23 ENCOUNTER — Other Ambulatory Visit: Payer: Self-pay | Admitting: Physician Assistant

## 2017-10-23 ENCOUNTER — Other Ambulatory Visit: Payer: Self-pay | Admitting: *Deleted

## 2017-10-23 DIAGNOSIS — F4323 Adjustment disorder with mixed anxiety and depressed mood: Secondary | ICD-10-CM | POA: Insufficient documentation

## 2017-10-23 DIAGNOSIS — M65342 Trigger finger, left ring finger: Secondary | ICD-10-CM | POA: Insufficient documentation

## 2017-10-23 DIAGNOSIS — F418 Other specified anxiety disorders: Secondary | ICD-10-CM

## 2017-10-23 MED ORDER — LEVOTHYROXINE SODIUM 137 MCG PO TABS: 137.0000 ug | ORAL_TABLET | Freq: Every day | ORAL | 1 refills | Status: DC

## 2017-10-23 MED ORDER — SERTRALINE HCL 50 MG PO TABS: 50.0000 mg | ORAL_TABLET | Freq: Every day | ORAL | 3 refills | Status: DC

## 2017-10-23 MED ORDER — MONTELUKAST SODIUM 10 MG PO TABS: ORAL_TABLET | ORAL | 4 refills | Status: DC

## 2017-10-23 MED ORDER — PEN NEEDLES 31G X 6 MM MISC: 11 refills | Status: DC

## 2017-10-23 MED ORDER — SUMATRIPTAN SUCCINATE 100 MG PO TABS
100.0000 mg | ORAL_TABLET | ORAL | 5 refills | Status: DC | PRN
Start: 2017-10-23 — End: 2018-11-23

## 2017-10-23 NOTE — Addendum Note (Signed)
Addended by: Collie Siad on: 10/23/2017 09:29 AM   Modules accepted: Orders

## 2017-10-23 NOTE — Progress Notes (Signed)
Call pt: thyroid normal. Ok for 6 month refills. Kidney, liver look great.

## 2017-10-24 LAB — COMPLETE METABOLIC PANEL WITH GFR
AG RATIO: 1.5 (calc) (ref 1.0–2.5)
ALT: 12 U/L (ref 6–29)
AST: 14 U/L (ref 10–35)
Albumin: 3.8 g/dL (ref 3.6–5.1)
Alkaline phosphatase (APISO): 83 U/L (ref 33–115)
BILIRUBIN TOTAL: 0.4 mg/dL (ref 0.2–1.2)
BUN: 14 mg/dL (ref 7–25)
CHLORIDE: 103 mmol/L (ref 98–110)
CO2: 26 mmol/L (ref 20–32)
Calcium: 9.2 mg/dL (ref 8.6–10.2)
Creat: 0.68 mg/dL (ref 0.50–1.10)
GFR, EST AFRICAN AMERICAN: 120 mL/min/{1.73_m2} (ref >=60)
GFR, Est Non African American: 103 mL/min/{1.73_m2} (ref >=60)
Globulin: 2.6 g/dL (calc) (ref 1.9–3.7)
Glucose, Bld: 93 mg/dL (ref 65–99)
POTASSIUM: 4.4 mmol/L (ref 3.5–5.3)
Sodium: 137 mmol/L (ref 135–146)
Total Protein: 6.4 g/dL (ref 6.1–8.1)

## 2017-10-24 LAB — VITAMIN D 1,25 DIHYDROXY
VITAMIN D2 1, 25 (OH): 30 pg/mL
Vitamin D 1, 25 (OH)2 Total: 68 pg/mL (ref 18–72)
Vitamin D3 1, 25 (OH)2: 38 pg/mL

## 2017-10-24 LAB — TSH: TSH: 1.38 mIU/L

## 2017-11-15 DIAGNOSIS — G4733 Obstructive sleep apnea (adult) (pediatric): Secondary | ICD-10-CM | POA: Diagnosis not present

## 2017-11-19 ENCOUNTER — Encounter: Payer: Self-pay | Admitting: Physician Assistant

## 2017-11-19 ENCOUNTER — Ambulatory Visit (INDEPENDENT_AMBULATORY_CARE_PROVIDER_SITE_OTHER): Payer: BLUE CROSS/BLUE SHIELD | Admitting: Physician Assistant

## 2017-11-19 DIAGNOSIS — Z9884 Bariatric surgery status: Secondary | ICD-10-CM

## 2017-11-19 DIAGNOSIS — E8881 Metabolic syndrome: Secondary | ICD-10-CM

## 2017-11-19 DIAGNOSIS — R7303 Prediabetes: Secondary | ICD-10-CM | POA: Diagnosis not present

## 2017-11-19 NOTE — Progress Notes (Signed)
   Subjective:    Patient ID: Amanda Davidson, female    DOB: 05/07/1971, 47 y.o.   MRN: [id number]  HPI Patient is a 47 year old female with status post laparoscopic sleeve gastrectomy who presents to the clinic to follow-up on her new Victoza start.  She is actually been on this medicine for about 3 weeks.  She has lost 12 pounds.  She denies any side effects.  She is not monitoring her sugars.  She feels like it is definitely curbing her appetite and helping her make better choices.  She is not on any specific exercise plan but she is more active.  She does plan on increasing her activity over the next few weeks.  .. Active Ambulatory Problems    Diagnosis Date Noted  . Sleep apnea 05/24/2012  . Hypothyroidism 05/24/2012  . Migraine 05/24/2012  . Vitamin D deficiency 05/24/2012  . Asthma 05/24/2012  . Depression with anxiety 05/24/2012  . Hypertension, essential, benign 06/30/2012  . Asthma, moderate persistent 09/18/2012  . Morbid obesity (HCC) 09/18/2012  . Laparoscopic sleeve gastrectomy Dec 2014 05/14/2013  . Insulin resistance 05/24/2013  . Trigger finger, left ring finger 10/23/2017  . Adjustment disorder with mixed anxiety and depressed mood 10/23/2017  . Pre-diabetes 11/19/2017   Resolved Ambulatory Problems    Diagnosis Date Noted  . Diabetes mellitus, type 2 (HCC) 09/18/2012  . Type 2 diabetes mellitus without complication, without long-term current use of insulin (HCC) 05/19/2017   Past Medical History:  Diagnosis Date  . Anemia   . Asthma   . Complication of anesthesia   . Depression   . Diabetes mellitus without complication (HCC)   . Drug use   . Exercise-induced asthma   . GERD (gastroesophageal reflux disease)   . Headache(784.0)   . Hypertension   . Hypothyroidism   . IBS (irritable bowel syndrome)   . Migraines   . Obesity   . Post-nasal drip   . Shortness of breath   . Sleep apnea   . Vitamin D deficiency       Review of Systems  All  other systems reviewed and are negative.      Objective:   Physical Exam  Constitutional: She is oriented to person, place, and time. She appears well-developed and well-nourished.  Obese.   Cardiovascular: Normal rate, regular rhythm and normal heart sounds.  No murmur heard. Pulmonary/Chest: Effort normal and breath sounds normal.  Neurological: She is alert and oriented to person, place, and time.  Psychiatric: She has a normal mood and affect. Her behavior is normal.          Assessment & Plan:  ...Amanda Davidson was seen today for follow-up.  Diagnoses and all orders for this visit:  Morbid obesity [facility](HCC)  Laparoscopic sleeve gastrectomy Dec 2014  Insulin resistance  Pre-diabetes   Patient actually does not need a refill on Victoza at this time.  There should be 2 refills on the prescription.  I am very pleased with her results and tolerability.  I would like for her to continue Victoza at the 1.8 mg dose.  Encouraged her to increase her exercise daily.  Also discussed other diet changes she can make such as increasing protein decreasing carbs and sugars.  Follow-up in 3 months.

## 2017-12-10 DIAGNOSIS — Z6841 Body Mass Index (BMI) 40.0 and over, adult: Secondary | ICD-10-CM | POA: Diagnosis not present

## 2017-12-10 DIAGNOSIS — Z01419 Encounter for gynecological examination (general) (routine) without abnormal findings: Secondary | ICD-10-CM | POA: Diagnosis not present

## 2017-12-10 DIAGNOSIS — Z124 Encounter for screening for malignant neoplasm of cervix: Secondary | ICD-10-CM | POA: Diagnosis not present

## 2017-12-13 LAB — RESULTS CONSOLE HPV: CHL HPV: NEGATIVE

## 2017-12-13 LAB — HM PAP SMEAR: HM Pap smear: NORMAL

## 2018-02-06 ENCOUNTER — Other Ambulatory Visit: Payer: Self-pay

## 2018-02-06 MED ORDER — LIRAGLUTIDE 18 MG/3ML ~~LOC~~ SOPN: PEN_INJECTOR | SUBCUTANEOUS | 2 refills | Status: DC

## 2018-02-17 DIAGNOSIS — J020 Streptococcal pharyngitis: Secondary | ICD-10-CM | POA: Diagnosis not present

## 2018-02-20 ENCOUNTER — Ambulatory Visit: Payer: BLUE CROSS/BLUE SHIELD | Admitting: Physician Assistant

## 2018-03-12 DIAGNOSIS — Z1231 Encounter for screening mammogram for malignant neoplasm of breast: Secondary | ICD-10-CM | POA: Diagnosis not present

## 2018-03-12 LAB — HM MAMMOGRAPHY

## 2018-03-23 DIAGNOSIS — G4733 Obstructive sleep apnea (adult) (pediatric): Secondary | ICD-10-CM | POA: Diagnosis not present

## 2018-03-26 ENCOUNTER — Encounter: Payer: Self-pay | Admitting: Physician Assistant

## 2018-04-14 ENCOUNTER — Encounter: Payer: Self-pay | Admitting: Adult Health

## 2018-04-20 ENCOUNTER — Encounter: Payer: Self-pay | Admitting: Physician Assistant

## 2018-04-20 ENCOUNTER — Ambulatory Visit (INDEPENDENT_AMBULATORY_CARE_PROVIDER_SITE_OTHER): Payer: BLUE CROSS/BLUE SHIELD | Admitting: Physician Assistant

## 2018-04-20 VITALS — BP 131/74 | HR 75 | Ht 66.0 in | Wt 291.0 lb

## 2018-04-20 DIAGNOSIS — R220 Localized swelling, mass and lump, head: Secondary | ICD-10-CM | POA: Diagnosis not present

## 2018-04-20 DIAGNOSIS — G4733 Obstructive sleep apnea (adult) (pediatric): Secondary | ICD-10-CM

## 2018-04-20 DIAGNOSIS — R0683 Snoring: Secondary | ICD-10-CM | POA: Diagnosis not present

## 2018-04-20 DIAGNOSIS — E119 Type 2 diabetes mellitus without complications: Secondary | ICD-10-CM | POA: Diagnosis not present

## 2018-04-20 DIAGNOSIS — E039 Hypothyroidism, unspecified: Secondary | ICD-10-CM

## 2018-04-20 DIAGNOSIS — J351 Hypertrophy of tonsils: Secondary | ICD-10-CM

## 2018-04-20 DIAGNOSIS — J358 Other chronic diseases of tonsils and adenoids: Secondary | ICD-10-CM | POA: Insufficient documentation

## 2018-04-20 LAB — POCT GLYCOSYLATED HEMOGLOBIN (HGB A1C): Hemoglobin A1C: 5.5 % (ref 4.0–5.6)

## 2018-04-20 MED ORDER — LIRAGLUTIDE 18 MG/3ML ~~LOC~~ SOPN: PEN_INJECTOR | SUBCUTANEOUS | 2 refills | Status: DC

## 2018-04-20 MED ORDER — PREDNISONE 50 MG PO TABS: ORAL_TABLET | ORAL | 0 refills | Status: DC

## 2018-04-20 MED ORDER — LEVOTHYROXINE SODIUM 137 MCG PO TABS: 137.0000 ug | ORAL_TABLET | Freq: Every day | ORAL | 1 refills | Status: DC

## 2018-04-20 NOTE — Patient Instructions (Signed)
Prednisone burst.  flonase 2 sprays each nostril.  ENT referral.

## 2018-04-20 NOTE — Progress Notes (Signed)
Subjective:    Patient ID: Amanda Davidson, female    DOB: Apr 13, 1971, 47 y.o.   MRN: [id number]  HPI  Patient is a 47 year old morbidly obese female with hypertension, OSA, prediabetes who presents to the clinic with concern over a tonsil mass.  Patient was treated for strep about 2 months ago.  She feels like she is never fully recovered.  She denies any sore throat, fever, chills, sinus pressure, ear pain.  She just feels like her tonsils are larger.  At times she has difficulty swallowing.  She feels like she is having a lot of postnasal drip.  She sees something on her left tonsil and is concerned.  Her grandfather did have throat cancer.  She has never smoked.  Pt is using her CPAP every night for at least 6 hours.   Patient has diabetes and is managed with Victoza.  She denies checking any of her sugars.  She denies any hypoglycemia.  She does not have any open sores or wounds.  She does continue to gain weight.  She is currently working 2 jobs and raising 2 teenagers.  She is not exercising.  .. Active Ambulatory Problems    Diagnosis Date Noted  . OSA (obstructive sleep apnea) 05/24/2012  . Hypothyroidism 05/24/2012  . Migraine 05/24/2012  . Vitamin D deficiency 05/24/2012  . Asthma 05/24/2012  . Depression with anxiety 05/24/2012  . Hypertension, essential, benign 06/30/2012  . Asthma, moderate persistent 09/18/2012  . Morbid obesity (HCC) 09/18/2012  . Laparoscopic sleeve gastrectomy Dec 2014 05/14/2013  . Insulin resistance 05/24/2013  . Type 2 diabetes mellitus without complication, without long-term current use of insulin (HCC) 05/19/2017  . Trigger finger, left ring finger 10/23/2017  . Adjustment disorder with mixed anxiety and depressed mood 10/23/2017  . Pre-diabetes 11/19/2017  . Tonsillar mass 04/20/2018  . Tonsillar enlargement 04/20/2018  . Snoring 04/21/2018   Resolved Ambulatory Problems    Diagnosis Date Noted  . Diabetes mellitus, type 2 (HCC)  09/18/2012   Past Medical History:  Diagnosis Date  . Anemia   . Complication of anesthesia   . Depression   . Diabetes mellitus without complication (HCC)   . Drug use   . Exercise-induced asthma   . GERD (gastroesophageal reflux disease)   . Headache(784.0)   . Hypertension   . IBS (irritable bowel syndrome)   . Migraines   . Obesity   . Post-nasal drip   . Shortness of breath   . Sleep apnea       Review of Systems  All other systems reviewed and are negative.      Objective:   Physical Exam  Constitutional: She is oriented to person, place, and time. She appears well-developed and well-nourished.  Obese.   HENT:  Head: Normocephalic and atraumatic.  Right Ear: External ear normal.  Left Ear: External ear normal.  Mouth/Throat: No oropharyngeal exudate.  TM's clear.   Bilateral enlarged tonsils that are erythematous and almost touching with visulization. There is a pea size left tonsillar mass seen.   Neck: Normal range of motion. Neck supple.  Cardiovascular: Normal rate and regular rhythm.  Pulmonary/Chest: Effort normal and breath sounds normal.  Lymphadenopathy:    She has no cervical adenopathy.  Neurological: She is alert and oriented to person, place, and time.  Skin: No rash noted.  Psychiatric: She has a normal mood and affect. Her behavior is normal.          Assessment & Plan:  Marland KitchenMarland Kitchen  Diagnoses and all orders for this visit:  Tonsillar enlargement -     predniSONE (DELTASONE) 50 MG tablet; One tab PO daily for 5 days. -     Ambulatory referral to ENT  Snoring -     Ambulatory referral to ENT  Tonsillar mass -     Ambulatory referral to ENT  OSA (obstructive sleep apnea) -     Ambulatory referral to ENT  Acquired hypothyroidism -     levothyroxine (SYNTHROID, LEVOTHROID) 137 MCG tablet; Take 1 tablet (137 mcg total) by mouth daily before breakfast.  Type 2 diabetes mellitus without complication, without long-term current use of insulin  (HCC) -     POCT glycosylated hemoglobin (Hb A1C) -     liraglutide (VICTOZA) 18 MG/3ML SOPN; Take .6mg  daily for one week, take 1.2mg  for one week, then 1.8 daily. Please include needles 6mm.   Tonsils do not appear infected. No exudate. They are red and swollen with a pea-sized mass on left tip of of tonsil. Tonsils almost touching with oral exam and the "ahh" sound. Will give prednisone to help with the enlargement of tonsils which could be post-infection inflammation.Flonase to help some with PND. ENT referral made. I do think tonsillectomy should be considered. Could help with apnea/snoring as well.   TSH up to date and normal. Refills sent.   .. Lab Results  Component Value Date   HGBA1C 5.5 04/20/2018   Great a1C.  Continue on victoza.  Needs eye exam.  Could not given urine sample today.   .. Diabetic Foot Exam - Simple   Simple Foot Form Visual Inspection No deformities, no ulcerations, no other skin breakdown bilaterally:  Yes Sensation Testing Intact to touch and monofilament testing bilaterally:  Yes Pulse Check Posterior Tibialis and Dorsalis pulse intact bilaterally:  Yes Comments    NEEDs CPE.

## 2018-04-21 ENCOUNTER — Encounter: Payer: Self-pay | Admitting: Physician Assistant

## 2018-04-21 DIAGNOSIS — R0683 Snoring: Secondary | ICD-10-CM | POA: Insufficient documentation

## 2018-04-23 DIAGNOSIS — G4733 Obstructive sleep apnea (adult) (pediatric): Secondary | ICD-10-CM | POA: Diagnosis not present

## 2018-04-27 ENCOUNTER — Ambulatory Visit: Payer: BLUE CROSS/BLUE SHIELD | Admitting: Adult Health

## 2018-04-27 DIAGNOSIS — D105 Benign neoplasm of other parts of oropharynx: Secondary | ICD-10-CM | POA: Diagnosis not present

## 2018-04-27 DIAGNOSIS — G4733 Obstructive sleep apnea (adult) (pediatric): Secondary | ICD-10-CM | POA: Diagnosis not present

## 2018-04-27 DIAGNOSIS — J351 Hypertrophy of tonsils: Secondary | ICD-10-CM | POA: Diagnosis not present

## 2018-04-28 ENCOUNTER — Encounter: Payer: Self-pay | Admitting: Physician Assistant

## 2018-04-28 DIAGNOSIS — R220 Localized swelling, mass and lump, head: Principal | ICD-10-CM

## 2018-04-28 DIAGNOSIS — J351 Hypertrophy of tonsils: Secondary | ICD-10-CM

## 2018-04-28 DIAGNOSIS — J358 Other chronic diseases of tonsils and adenoids: Secondary | ICD-10-CM

## 2018-04-28 DIAGNOSIS — R0683 Snoring: Secondary | ICD-10-CM

## 2018-05-23 DIAGNOSIS — G4733 Obstructive sleep apnea (adult) (pediatric): Secondary | ICD-10-CM | POA: Diagnosis not present

## 2018-05-26 DIAGNOSIS — Z8249 Family history of ischemic heart disease and other diseases of the circulatory system: Secondary | ICD-10-CM | POA: Diagnosis not present

## 2018-05-26 DIAGNOSIS — J3501 Chronic tonsillitis: Secondary | ICD-10-CM | POA: Diagnosis not present

## 2018-05-26 DIAGNOSIS — I10 Essential (primary) hypertension: Secondary | ICD-10-CM | POA: Diagnosis not present

## 2018-05-26 DIAGNOSIS — J351 Hypertrophy of tonsils: Secondary | ICD-10-CM | POA: Diagnosis not present

## 2018-05-26 DIAGNOSIS — G4733 Obstructive sleep apnea (adult) (pediatric): Secondary | ICD-10-CM | POA: Diagnosis not present

## 2018-05-26 DIAGNOSIS — Z833 Family history of diabetes mellitus: Secondary | ICD-10-CM | POA: Diagnosis not present

## 2018-05-26 DIAGNOSIS — E119 Type 2 diabetes mellitus without complications: Secondary | ICD-10-CM | POA: Diagnosis not present

## 2018-05-26 DIAGNOSIS — E079 Disorder of thyroid, unspecified: Secondary | ICD-10-CM | POA: Diagnosis not present

## 2018-05-26 DIAGNOSIS — D105 Benign neoplasm of other parts of oropharynx: Secondary | ICD-10-CM | POA: Diagnosis not present

## 2018-05-26 DIAGNOSIS — J45909 Unspecified asthma, uncomplicated: Secondary | ICD-10-CM | POA: Diagnosis not present

## 2018-05-26 DIAGNOSIS — Z809 Family history of malignant neoplasm, unspecified: Secondary | ICD-10-CM | POA: Diagnosis not present

## 2018-05-26 DIAGNOSIS — Z79899 Other long term (current) drug therapy: Secondary | ICD-10-CM | POA: Diagnosis not present

## 2018-06-23 DIAGNOSIS — G4733 Obstructive sleep apnea (adult) (pediatric): Secondary | ICD-10-CM | POA: Diagnosis not present

## 2018-06-28 DIAGNOSIS — S82401A Unspecified fracture of shaft of right fibula, initial encounter for closed fracture: Secondary | ICD-10-CM | POA: Diagnosis not present

## 2018-06-29 DIAGNOSIS — S82402A Unspecified fracture of shaft of left fibula, initial encounter for closed fracture: Secondary | ICD-10-CM | POA: Diagnosis not present

## 2018-06-29 DIAGNOSIS — S82202A Unspecified fracture of shaft of left tibia, initial encounter for closed fracture: Secondary | ICD-10-CM | POA: Diagnosis not present

## 2018-06-29 DIAGNOSIS — M25572 Pain in left ankle and joints of left foot: Secondary | ICD-10-CM | POA: Diagnosis not present

## 2018-06-29 DIAGNOSIS — S82409A Unspecified fracture of shaft of unspecified fibula, initial encounter for closed fracture: Secondary | ICD-10-CM | POA: Diagnosis not present

## 2018-07-06 DIAGNOSIS — M25572 Pain in left ankle and joints of left foot: Secondary | ICD-10-CM | POA: Diagnosis not present

## 2018-07-23 DIAGNOSIS — J111 Influenza due to unidentified influenza virus with other respiratory manifestations: Secondary | ICD-10-CM | POA: Diagnosis not present

## 2018-07-23 DIAGNOSIS — I10 Essential (primary) hypertension: Secondary | ICD-10-CM | POA: Diagnosis not present

## 2018-07-23 DIAGNOSIS — R509 Fever, unspecified: Secondary | ICD-10-CM | POA: Diagnosis not present

## 2018-07-24 DIAGNOSIS — G4733 Obstructive sleep apnea (adult) (pediatric): Secondary | ICD-10-CM | POA: Diagnosis not present

## 2018-07-27 DIAGNOSIS — M25571 Pain in right ankle and joints of right foot: Secondary | ICD-10-CM | POA: Diagnosis not present

## 2018-07-30 DIAGNOSIS — S82409A Unspecified fracture of shaft of unspecified fibula, initial encounter for closed fracture: Secondary | ICD-10-CM | POA: Diagnosis not present

## 2018-08-17 DIAGNOSIS — M25571 Pain in right ankle and joints of right foot: Secondary | ICD-10-CM | POA: Diagnosis not present

## 2018-08-22 DIAGNOSIS — G4733 Obstructive sleep apnea (adult) (pediatric): Secondary | ICD-10-CM | POA: Diagnosis not present

## 2018-08-28 DIAGNOSIS — S82409A Unspecified fracture of shaft of unspecified fibula, initial encounter for closed fracture: Secondary | ICD-10-CM | POA: Diagnosis not present

## 2018-09-10 ENCOUNTER — Other Ambulatory Visit: Payer: Self-pay | Admitting: Physician Assistant

## 2018-09-15 ENCOUNTER — Telehealth: Payer: Self-pay | Admitting: Neurology

## 2018-09-15 NOTE — Telephone Encounter (Signed)
Called patient, she was overdue for labs for DM/ LDL. Patient is willing to have virtual visit with [facility]. Our front desk with contact her back to schedule follow up.

## 2018-09-22 DIAGNOSIS — G4733 Obstructive sleep apnea (adult) (pediatric): Secondary | ICD-10-CM | POA: Diagnosis not present

## 2018-09-28 DIAGNOSIS — S82409A Unspecified fracture of shaft of unspecified fibula, initial encounter for closed fracture: Secondary | ICD-10-CM | POA: Diagnosis not present

## 2018-10-07 DIAGNOSIS — M25571 Pain in right ankle and joints of right foot: Secondary | ICD-10-CM | POA: Diagnosis not present

## 2018-10-07 DIAGNOSIS — S8264XD Nondisplaced fracture of lateral malleolus of right fibula, subsequent encounter for closed fracture with routine healing: Secondary | ICD-10-CM | POA: Diagnosis not present

## 2018-10-28 DIAGNOSIS — S82409A Unspecified fracture of shaft of unspecified fibula, initial encounter for closed fracture: Secondary | ICD-10-CM | POA: Diagnosis not present

## 2018-11-20 ENCOUNTER — Other Ambulatory Visit: Payer: Self-pay | Admitting: Physician Assistant

## 2018-11-20 DIAGNOSIS — J4540 Moderate persistent asthma, uncomplicated: Secondary | ICD-10-CM

## 2018-11-20 DIAGNOSIS — G43009 Migraine without aura, not intractable, without status migrainosus: Secondary | ICD-10-CM

## 2018-12-04 ENCOUNTER — Other Ambulatory Visit: Payer: Self-pay | Admitting: Physician Assistant

## 2018-12-04 DIAGNOSIS — J4540 Moderate persistent asthma, uncomplicated: Secondary | ICD-10-CM

## 2018-12-07 ENCOUNTER — Other Ambulatory Visit: Payer: Self-pay | Admitting: Physician Assistant

## 2018-12-07 DIAGNOSIS — F418 Other specified anxiety disorders: Secondary | ICD-10-CM

## 2018-12-07 DIAGNOSIS — F4323 Adjustment disorder with mixed anxiety and depressed mood: Secondary | ICD-10-CM

## 2018-12-18 ENCOUNTER — Other Ambulatory Visit: Payer: Self-pay | Admitting: Physician Assistant

## 2018-12-18 DIAGNOSIS — J4540 Moderate persistent asthma, uncomplicated: Secondary | ICD-10-CM

## 2018-12-29 ENCOUNTER — Other Ambulatory Visit: Payer: Self-pay | Admitting: Physician Assistant

## 2018-12-29 DIAGNOSIS — J4540 Moderate persistent asthma, uncomplicated: Secondary | ICD-10-CM

## 2018-12-31 ENCOUNTER — Other Ambulatory Visit: Payer: Self-pay | Admitting: Physician Assistant

## 2018-12-31 DIAGNOSIS — J4540 Moderate persistent asthma, uncomplicated: Secondary | ICD-10-CM

## 2019-01-14 ENCOUNTER — Other Ambulatory Visit: Payer: Self-pay | Admitting: Physician Assistant

## 2019-01-14 DIAGNOSIS — J4540 Moderate persistent asthma, uncomplicated: Secondary | ICD-10-CM

## 2019-01-31 ENCOUNTER — Other Ambulatory Visit: Payer: Self-pay | Admitting: Physician Assistant

## 2019-01-31 DIAGNOSIS — J4540 Moderate persistent asthma, uncomplicated: Secondary | ICD-10-CM

## 2019-02-15 ENCOUNTER — Other Ambulatory Visit: Payer: Self-pay | Admitting: Physician Assistant

## 2019-02-15 DIAGNOSIS — J4540 Moderate persistent asthma, uncomplicated: Secondary | ICD-10-CM

## 2019-03-16 DIAGNOSIS — Z1231 Encounter for screening mammogram for malignant neoplasm of breast: Secondary | ICD-10-CM | POA: Diagnosis not present

## 2019-03-16 LAB — HM MAMMOGRAPHY

## 2019-04-15 ENCOUNTER — Encounter: Payer: Self-pay | Admitting: Physician Assistant

## 2019-07-04 DIAGNOSIS — Z20828 Contact with and (suspected) exposure to other viral communicable diseases: Secondary | ICD-10-CM | POA: Diagnosis not present

## 2019-12-09 ENCOUNTER — Encounter: Payer: Self-pay | Admitting: Physician Assistant

## 2019-12-09 DIAGNOSIS — Z0000 Encounter for general adult medical examination without abnormal findings: Secondary | ICD-10-CM

## 2019-12-09 DIAGNOSIS — E039 Hypothyroidism, unspecified: Secondary | ICD-10-CM

## 2019-12-09 NOTE — Telephone Encounter (Signed)
Lab order for annual visit pended for provider's review. No record  in patient's chart to discontinue thyroid medication.

## 2019-12-14 DIAGNOSIS — Z0000 Encounter for general adult medical examination without abnormal findings: Secondary | ICD-10-CM | POA: Diagnosis not present

## 2019-12-15 LAB — LIPID PANEL
Cholesterol: 177 mg/dL (ref <200)
HDL: 45 mg/dL — ABNORMAL LOW (ref >=50)
LDL Cholesterol (Calc): 106 mg/dL (calc) — ABNORMAL HIGH
Non-HDL Cholesterol (Calc): 132 mg/dL (calc) — ABNORMAL HIGH (ref <130)
Total CHOL/HDL Ratio: 3.9 (calc) (ref <5.0)
Triglycerides: 144 mg/dL (ref <150)

## 2019-12-15 LAB — COMPLETE METABOLIC PANEL WITH GFR
AG Ratio: 1.5 (calc) (ref 1.0–2.5)
ALT: 29 U/L (ref 6–29)
AST: 25 U/L (ref 10–35)
Albumin: 3.8 g/dL (ref 3.6–5.1)
Alkaline phosphatase (APISO): 80 U/L (ref 37–153)
BUN: 14 mg/dL (ref 7–25)
CO2: 29 mmol/L (ref 20–32)
Calcium: 9.2 mg/dL (ref 8.6–10.4)
Chloride: 100 mmol/L (ref 98–110)
Creat: 0.71 mg/dL (ref 0.50–1.05)
GFR, Est African American: 115 mL/min/{1.73_m2} (ref >=60)
GFR, Est Non African American: 99 mL/min/{1.73_m2} (ref >=60)
Globulin: 2.5 g/dL (calc) (ref 1.9–3.7)
Glucose, Bld: 123 mg/dL — ABNORMAL HIGH (ref 65–99)
Potassium: 4.4 mmol/L (ref 3.5–5.3)
Sodium: 137 mmol/L (ref 135–146)
Total Bilirubin: 0.5 mg/dL (ref 0.2–1.2)
Total Protein: 6.3 g/dL (ref 6.1–8.1)

## 2019-12-15 LAB — HEMOGLOBIN A1C
Hgb A1c MFr Bld: 6.2 % of total Hgb — ABNORMAL HIGH (ref <5.7)
Mean Plasma Glucose: 131 (calc)
eAG (mmol/L): 7.3 (calc)

## 2019-12-15 LAB — URINALYSIS, ROUTINE W REFLEX MICROSCOPIC
Bilirubin Urine: NEGATIVE
Glucose, UA: NEGATIVE
Hgb urine dipstick: NEGATIVE
Ketones, ur: NEGATIVE
Leukocytes,Ua: NEGATIVE
Nitrite: NEGATIVE
Protein, ur: NEGATIVE
Specific Gravity, Urine: 1.015 (ref 1.001–1.03)
pH: 7 (ref 5.0–8.0)

## 2019-12-15 LAB — CBC
HCT: 39.4 % (ref 35.0–45.0)
Hemoglobin: 13.1 g/dL (ref 11.7–15.5)
MCH: 29.4 pg (ref 27.0–33.0)
MCHC: 33.2 g/dL (ref 32.0–36.0)
MCV: 88.5 fL (ref 80.0–100.0)
MPV: 11.3 fL (ref 7.5–12.5)
Platelets: 290 10*3/uL (ref 140–400)
RBC: 4.45 10*6/uL (ref 3.80–5.10)
RDW: 13.5 % (ref 11.0–15.0)
WBC: 8.3 10*3/uL (ref 3.8–10.8)

## 2019-12-15 LAB — TSH: TSH: 6.3 mIU/L — ABNORMAL HIGH

## 2019-12-15 MED ORDER — LEVOTHYROXINE SODIUM 75 MCG PO TABS: 75.0000 ug | ORAL_TABLET | Freq: Every day | ORAL | 0 refills | Status: DC

## 2019-12-15 NOTE — Addendum Note (Signed)
Addended by: Jomarie Longs on: 12/15/2019 08:52 AM   Modules accepted: Orders

## 2019-12-15 NOTE — Telephone Encounter (Signed)
Amanda Davidson,   Urine normal.  A1C on the rise but still in pre-diabetes range only. Need to discuss at upcoming physical.  HDL low and LDL a little high. Overall risk is still pretty low unless you transition into diabetes then goals change for cholesterol.  TSH is elevated meaning HYPO thyroid. Are you taking thyroid medication?

## 2020-01-17 ENCOUNTER — Ambulatory Visit: Payer: BLUE CROSS/BLUE SHIELD | Admitting: Physician Assistant

## 2020-01-19 ENCOUNTER — Ambulatory Visit (INDEPENDENT_AMBULATORY_CARE_PROVIDER_SITE_OTHER): Payer: BC Managed Care – PPO | Admitting: Physician Assistant

## 2020-01-19 ENCOUNTER — Encounter: Payer: Self-pay | Admitting: Physician Assistant

## 2020-01-19 VITALS — BP 140/65 | HR 72 | Ht 66.0 in | Wt 327.0 lb

## 2020-01-19 DIAGNOSIS — J4540 Moderate persistent asthma, uncomplicated: Secondary | ICD-10-CM | POA: Diagnosis not present

## 2020-01-19 DIAGNOSIS — Z1211 Encounter for screening for malignant neoplasm of colon: Secondary | ICD-10-CM | POA: Diagnosis not present

## 2020-01-19 DIAGNOSIS — G43009 Migraine without aura, not intractable, without status migrainosus: Secondary | ICD-10-CM

## 2020-01-19 DIAGNOSIS — E039 Hypothyroidism, unspecified: Secondary | ICD-10-CM | POA: Diagnosis not present

## 2020-01-19 DIAGNOSIS — R7303 Prediabetes: Secondary | ICD-10-CM

## 2020-01-19 LAB — POCT UA - MICROALBUMIN
Albumin/Creatinine Ratio, Urine, POC: <30
Creatinine, POC: 100 mg/dL
Microalbumin Ur, POC: 10 mg/L

## 2020-01-19 MED ORDER — SUMATRIPTAN SUCCINATE 100 MG PO TABS: 100.0000 mg | ORAL_TABLET | ORAL | 5 refills | Status: DC | PRN

## 2020-01-19 MED ORDER — WEGOVY 0.25 MG/0.5ML ~~LOC~~ SOAJ: 0.2500 mg | SUBCUTANEOUS | 0 refills | Status: DC

## 2020-01-19 MED ORDER — MONTELUKAST SODIUM 10 MG PO TABS: ORAL_TABLET | ORAL | 3 refills | Status: DC

## 2020-01-19 NOTE — Progress Notes (Signed)
Subjective:    Patient ID: Amanda Davidson, female    DOB: March 13, 1971, 49 y.o.   MRN: [id number]  HPI  Pt is a 49 yo obese female with pre-diabetes, hypothyroidism,  migraines, OSA, MDD, ASthma, HTN who presents to the clinic for medication refills.   She recently had labs done. Pre-diabetes for sugar but her TSH was hypoactive. Increased synthroid.   Pt is frustrated with weight and no energy. Using CPAP. Pt denies worrisome depression or anxiety. She is not on zoloft of klonapin.   .. Active Ambulatory Problems    Diagnosis Date Noted  . OSA (obstructive sleep apnea) 05/24/2012  . Hypothyroidism 05/24/2012  . Migraine 05/24/2012  . Vitamin D deficiency 05/24/2012  . Asthma 05/24/2012  . Depression with anxiety 05/24/2012  . Hypertension, essential, benign 06/30/2012  . Asthma, moderate persistent 09/18/2012  . Morbid obesity (HCC) 09/18/2012  . Laparoscopic sleeve gastrectomy Dec 2014 05/14/2013  . Insulin resistance 05/24/2013  . Type 2 diabetes mellitus without complication, without long-term current use of insulin (HCC) 05/19/2017  . Trigger finger, left ring finger 10/23/2017  . Adjustment disorder with mixed anxiety and depressed mood 10/23/2017  . Pre-diabetes 11/19/2017  . Tonsillar mass 04/20/2018  . Tonsillar enlargement 04/20/2018  . Snoring 04/21/2018   Resolved Ambulatory Problems    Diagnosis Date Noted  . Diabetes mellitus, type 2 (HCC) 09/18/2012   Past Medical History:  Diagnosis Date  . Anemia   . Complication of anesthesia   . Depression   . Diabetes mellitus without complication (HCC)   . Drug use   . Exercise-induced asthma   . GERD (gastroesophageal reflux disease)   . Headache(784.0)   . Hypertension   . IBS (irritable bowel syndrome)   . Migraines   . Obesity   . Post-nasal drip   . Shortness of breath   . Sleep apnea     Review of Systems  All other systems reviewed and are negative.      Objective:   Physical Exam Vitals  reviewed.  Constitutional:      Appearance: Normal appearance. She is obese.  Cardiovascular:     Rate and Rhythm: Normal rate and regular rhythm.     Pulses: Normal pulses.  Pulmonary:     Effort: Pulmonary effort is normal.     Breath sounds: Normal breath sounds.  Neurological:     General: No focal deficit present.     Mental Status: She is alert and oriented to person, place, and time.  Psychiatric:        Mood and Affect: Mood normal.           Assessment & Plan:  Marland KitchenMarland KitchenMelissa was seen today for follow-up.  Diagnoses and all orders for this visit:  Hypothyroidism, unspecified type -     TSH  Colon cancer screening -     Ambulatory referral to Gastroenterology  Migraine without aura and without status migrainosus, not intractable -     SUMAtriptan (IMITREX) 100 MG tablet; Take 1 tablet (100 mg total) by mouth every 2 (two) hours as needed for migraine.  Moderate persistent asthma without complication -     montelukast (SINGULAIR) 10 MG tablet; Take one tablet daily.  Morbid obesity (HCC) -     Semaglutide-Weight Management (WEGOVY) 0.25 MG/0.5ML SOAJ; Inject 0.25 mg into the skin once a week.  Pre-diabetes -     POCT UA - Microalbumin  Other orders -     Discontinue: Semaglutide-Weight Management (WEGOVY) 0.25  MG/0.5ML SOAJ; Inject 0.25 mg into the skin once a week.   A1C-prediabetes.  Discussed exercise 150 minutes a week. Discussed weight loss.  Start wegovy. Discussed side effects and titration up to 2.4mg .  Report monthly.  Follow up in 3 months.  Consider weight watches or IF.   For energy/health start Vitamin D and B complex.   Recheck TSH to adjust medication accordingly.   Declines vaccines. Needs CPE.

## 2020-01-19 NOTE — Patient Instructions (Addendum)
Vitamin D 2000units a day.  B vitamin complex.  Recheck TSH in 6 weeks.  Start wegovy for weight.  Consider IF.

## 2020-01-20 ENCOUNTER — Encounter: Payer: Self-pay | Admitting: Gastroenterology

## 2020-02-15 ENCOUNTER — Telehealth: Payer: Self-pay

## 2020-02-15 NOTE — Telephone Encounter (Signed)
Dr. Barron Alvine  PT BMI is 52.8.  Problem include HTN, OSA, Asthma, DM, Enlarged Tonsils.  Would you prefer an OV or direct to [facility].  Thanks for your review.  Amanda Davidson.

## 2020-02-16 NOTE — Telephone Encounter (Signed)
OV first to discuss and will likely need to set up for procedure at [facility]. Thanks.

## 2020-02-16 NOTE — Telephone Encounter (Signed)
LM on pts voicemail to call back to schedule office visit prior to procedure d/t BMI > 50.

## 2020-02-17 ENCOUNTER — Encounter: Payer: Self-pay | Admitting: Gastroenterology

## 2020-02-17 NOTE — Telephone Encounter (Signed)
Called patient, no answer. Left a message for the patient to call back.

## 2020-02-17 NOTE — Telephone Encounter (Signed)
Patient called back and made OV on 03/30/20 with Dr.Cirigliano.

## 2020-02-23 ENCOUNTER — Other Ambulatory Visit: Payer: Self-pay | Admitting: Physician Assistant

## 2020-02-23 ENCOUNTER — Encounter: Payer: Self-pay | Admitting: Physician Assistant

## 2020-02-23 MED ORDER — WEGOVY 0.5 MG/0.5ML ~~LOC~~ SOAJ: 0.5000 mg | SUBCUTANEOUS | 0 refills | Status: DC

## 2020-02-24 NOTE — Telephone Encounter (Signed)
Adalei Novell - FYI ?

## 2020-03-11 ENCOUNTER — Other Ambulatory Visit: Payer: Self-pay | Admitting: Physician Assistant

## 2020-03-14 ENCOUNTER — Encounter: Payer: BC Managed Care – PPO | Admitting: Gastroenterology

## 2020-03-27 ENCOUNTER — Encounter: Payer: Self-pay | Admitting: Physician Assistant

## 2020-03-27 MED ORDER — WEGOVY 1 MG/0.5ML ~~LOC~~ SOAJ: 1.0000 mg | SUBCUTANEOUS | 0 refills | Status: DC

## 2020-03-30 ENCOUNTER — Encounter: Payer: Self-pay | Admitting: Gastroenterology

## 2020-03-30 ENCOUNTER — Ambulatory Visit (INDEPENDENT_AMBULATORY_CARE_PROVIDER_SITE_OTHER): Payer: BC Managed Care – PPO | Admitting: Gastroenterology

## 2020-03-30 VITALS — BP 128/76 | HR 85 | Ht 66.0 in | Wt 317.1 lb

## 2020-03-30 DIAGNOSIS — G4733 Obstructive sleep apnea (adult) (pediatric): Secondary | ICD-10-CM | POA: Diagnosis not present

## 2020-03-30 DIAGNOSIS — R195 Other fecal abnormalities: Secondary | ICD-10-CM

## 2020-03-30 DIAGNOSIS — Z1211 Encounter for screening for malignant neoplasm of colon: Secondary | ICD-10-CM

## 2020-03-30 MED ORDER — CLENPIQ 10-3.5-12 MG-GM -GM/160ML PO SOLN: 1.0000 | ORAL | 0 refills | Status: DC

## 2020-03-30 NOTE — Progress Notes (Signed)
Chief Complaint: Colon Cancer Screening, Change in stools, loose stools  Referring Provider:     Jomarie Longs, PA-C   HPI:     Amanda Davidson is a 49 y.o. female with a history of diabetes, depression, obesity (BMI 51.2), migraines, HTN, hypothyroidism, OSA (prescribed CPAP), gastric sleeve with ventral hernia repair 2014, referred to the [facility]astroenterology Clinic for evaluation of routine colon cancer screening along with evaluation of change in bowel habits.  No previous colon cancer screening, and due for age-appropriate, average risk screening.  Would like to proceed with colonoscopy.  Separately, she reports a history of intermittent loose stools for many years. Tends to be post prandial, especially worse with greasy or spicy foods.  Her baseline is 2-3 soft stools/day, then will occasionally have postprandial loose stools but seem to be dietary related. No abdominal pain, n/v/f/c. No hematochezia, melena. Has not trialed any meds, supplements, fiber, dietary mods. Weight stable.   -12/14/2019: TSH 6.3.  Normal CBC, CMP.  A1c 6.2%  Maternal great grandmother with CRC, mother with colon polyps. Otherwise, no FHx of GI malignancy, IBD.    Past Medical History:  Diagnosis Date  . Anemia   . Asthma   . Complication of anesthesia    NAUSEA  . Depression   . Diabetes mellitus without complication (HCC)   . Drug use   . Exercise-induced asthma   . GERD (gastroesophageal reflux disease)   . Headache(784.0)    MIGRAINES  . Hypertension   . Hypothyroidism   . IBS (irritable bowel syndrome)   . Migraines   . Obesity   . Post-nasal drip   . Shortness of breath   . Sleep apnea    C-PAP but doesnt use it regularly   . Vitamin D deficiency      Past Surgical History:  Procedure Laterality Date  . HERNIA REPAIR     Umbilical around 2002 and then repair 2015  . LAPAROSCOPIC GASTRIC SLEEVE RESECTION N/A 05/14/2013   Procedure: LAPAROSCOPIC GASTRIC SLEEVE  RESECTION;  Surgeon: Valarie Merino, MD;  Location: [facility];  Service: General;  Laterality: N/A;  . UMBILICAL HERNIA REPAIR  05/14/2013   Procedure: HERNIA REPAIR UMBILICAL ADULT;  Surgeon: Valarie Merino, MD;  Location: [facility];  Service: General;;  . UPPER GI ENDOSCOPY  05/14/2013   Procedure: UPPER GI ENDOSCOPY;  Surgeon: Valarie Merino, MD;  Location: [facility];  Service: General;;   Family History  Problem Relation Age of Onset  . Hypertension Mother   . Heart disease Mother   . Depression Mother   . Hypertension Father   . Diabetes Father   . Sleep apnea Father   . Heart disease Maternal Grandfather   . Heart attack Maternal Grandfather   . Cancer Maternal Grandfather        bladder  . Hyperlipidemia Other   . Sleep apnea Other   . Esophageal cancer Paternal Grandfather   . Colon cancer Maternal Great-grandmother    Social History   Tobacco Use  . Smoking status: Never Smoker  . Smokeless tobacco: Never Used  Vaping Use  . Vaping Use: Never used  Substance Use Topics  . Alcohol use: Yes    Alcohol/week: 1.0 standard drink    Types: 1 drink(s) per week    Comment: 2 glasses wine @ 2x/month  . Drug use: No   Current Outpatient Medications  Medication Sig Dispense Refill  .  levothyroxine (SYNTHROID) 75 MCG tablet Take 1 tablet (75 mcg total) by mouth daily. Needs labs 30 tablet 0  . montelukast (SINGULAIR) 10 MG tablet Take one tablet daily. 90 tablet 3  . Semaglutide-Weight Management (WEGOVY) 1 MG/0.5ML SOAJ Inject 1 mg into the skin once a week. 2 mL 0  . SUMAtriptan (IMITREX) 100 MG tablet Take 1 tablet (100 mg total) by mouth every 2 (two) hours as needed for migraine. 9 tablet 5   No current facility-administered medications for this visit.   No Known Allergies   Review of Systems: All systems reviewed and negative except where noted in HPI.     Physical Exam:    Wt Readings from Last 3 Encounters:  03/30/20 (!) 317 lb 2 oz (143.8 kg)  01/19/20 (!)  327 lb (148.3 kg)  04/20/18 291 lb (132 kg)    BP 128/76   Pulse 85   Ht 5\' 6"  (1.676 m)   Wt (!) 317 lb 2 oz (143.8 kg)   BMI 51.19 kg/m  Constitutional:  Pleasant, in no acute distress. Psychiatric: Normal mood and affect. Behavior is normal. EENT: Pupils normal.  Conjunctivae are normal. No scleral icterus. Neck supple. No cervical LAD. Cardiovascular: Normal rate, regular rhythm. No edema Pulmonary/chest: Effort normal and breath sounds normal. No wheezing, rales or rhonchi. Abdominal: Ventral hernia. Soft, nondistended, nontender. Bowel sounds active throughout. There are no masses palpable. No hepatomegaly. Neurological: Alert and oriented to person place and time. Skin: Skin is warm and dry. No rashes noted.   ASSESSMENT AND PLAN;   1) Colon cancer screening -Due for age-appropriate, average risk CRC screening -Schedule colonoscopy at [facility]  2) Loose stools/Change in stool -Intermittent loose stools that tends to be postprandial and related to dietary indiscretions.  Otherwise stools revert back to baseline and no associated abdominal pain or red flag symptoms -Certainly evaluate for mucosal/luminal pathology at time of colonoscopy as above, with plan for directed biopsies as appropriate -Trial fiber supplement.  3) Obesity (BMI 51.2) 4) OSA -Schedule colonoscopy at [facility] due to elevated periprocedural risks 2/2 comorbidities  The indications, risks, and benefits of colonoscopy were explained to the patient in detail. Risks include but are not limited to bleeding, perforation, adverse reaction to medications, and cardiopulmonary compromise. Sequelae include but are not limited to the possibility of surgery, hospitalization, and mortality. The patient verbalized understanding and wished to proceed. All questions answered, referred to the scheduler and bowel prep ordered. Further recommendations pending results of the exam.      [facility],  DO, FACG  03/30/2020, 10:38 AM   04/01/2020, PA-C

## 2020-03-30 NOTE — Patient Instructions (Addendum)
If you are age 51 or older, your body mass index should be between 23-30. Your Body mass index is 51.19 kg/m. If this is out of the aforementioned range listed, please consider follow up with your Primary Care Provider.  If you are age 69 or younger, your body mass index should be between 19-25. Your Body mass index is 51.19 kg/m. If this is out of the aformentioned range listed, please consider follow up with your Primary Care Provider.   We have sent the following medications to your pharmacy for you to pick up at your convenience:  Clenpiq  Please start using a fiber supplement such as Benefiber or Citracel  Due to recent changes in healthcare laws, you may see the results of your imaging and laboratory studies on MyChart before your provider has had a chance to review them.  We understand that in some cases there may be results that are confusing or concerning to you. Not all laboratory results come back in the same time frame and the provider may be waiting for multiple results in order to interpret others.  Please give [place] 48 hours in order for your provider to thoroughly review all the results before contacting the office for clarification of your results.

## 2020-04-19 ENCOUNTER — Ambulatory Visit (INDEPENDENT_AMBULATORY_CARE_PROVIDER_SITE_OTHER): Payer: BC Managed Care – PPO | Admitting: Physician Assistant

## 2020-04-19 ENCOUNTER — Encounter: Payer: Self-pay | Admitting: Physician Assistant

## 2020-04-19 ENCOUNTER — Other Ambulatory Visit: Payer: Self-pay

## 2020-04-19 VITALS — BP 149/62 | HR 72 | Ht 66.0 in | Wt 312.0 lb

## 2020-04-19 DIAGNOSIS — R030 Elevated blood-pressure reading, without diagnosis of hypertension: Secondary | ICD-10-CM

## 2020-04-19 DIAGNOSIS — J40 Bronchitis, not specified as acute or chronic: Secondary | ICD-10-CM

## 2020-04-19 DIAGNOSIS — E039 Hypothyroidism, unspecified: Secondary | ICD-10-CM | POA: Diagnosis not present

## 2020-04-19 DIAGNOSIS — E119 Type 2 diabetes mellitus without complications: Secondary | ICD-10-CM

## 2020-04-19 DIAGNOSIS — J329 Chronic sinusitis, unspecified: Secondary | ICD-10-CM

## 2020-04-19 DIAGNOSIS — Z6841 Body Mass Index (BMI) 40.0 and over, adult: Secondary | ICD-10-CM

## 2020-04-19 LAB — POCT GLYCOSYLATED HEMOGLOBIN (HGB A1C): Hemoglobin A1C: 5.8 % — AB (ref 4.0–5.6)

## 2020-04-19 LAB — TSH: TSH: 3.37 mIU/L

## 2020-04-19 MED ORDER — WEGOVY 1.7 MG/0.75ML ~~LOC~~ SOAJ: 1.7000 mg | SUBCUTANEOUS | 0 refills | Status: DC

## 2020-04-19 MED ORDER — ALBUTEROL SULFATE HFA 108 (90 BASE) MCG/ACT IN AERS: 2.0000 | INHALATION_SPRAY | Freq: Four times a day (QID) | RESPIRATORY_TRACT | 1 refills | Status: DC | PRN

## 2020-04-19 MED ORDER — AZITHROMYCIN 250 MG PO TABS: ORAL_TABLET | ORAL | 0 refills | Status: DC

## 2020-04-19 NOTE — Progress Notes (Signed)
Subjective:    Patient ID: Amanda Davidson, female    DOB: November 24, 1970, 49 y.o.   MRN: [id number]  HPI  Patient is a 49 year old obese female with type 2 diabetes who presents to the clinic to follow-up on weight loss.  Patient last A1c was 6.2 in the summer.  We will recheck today.  Patient is only taking would go very for weight loss and glucose control.  She denies any nausea, vomiting, GI disturbances.  She is tolerating the medication very well.  She believes we recorded her last weight wrong.  She believes she was 327 pounds in office.  She is now 312.  She has lost 15 pounds.  She is not exercising like she should but she has cut back on portion sizes and trying to make better decisions.  She is already feeling much better.  She denies any open sores or open wounds.  She denies any hypoglycemic events.  Her blood pressure is up today.  She denies any chest pain, palpitations, headaches, vision changes.  She has had her blood pressure checked at different offices recently and it has been in the 120s over 70s or 80s.  Patient does have an upcoming mammogram and colonoscopy scheduled.  Patient does admit to taking over-the-counter cough medication such as DayQuil.  She has been coughing for the last 2 weeks.  She is now has some sinus pressure and drainage.  She denies any fever, chills, body aches, sore throat or shortness of breath.  .. Active Ambulatory Problems    Diagnosis Date Noted  . OSA (obstructive sleep apnea) 05/24/2012  . Hypothyroidism 05/24/2012  . Migraine 05/24/2012  . Vitamin D deficiency 05/24/2012  . Asthma 05/24/2012  . Depression with anxiety 05/24/2012  . Hypertension, essential, benign 06/30/2012  . Asthma, moderate persistent 09/18/2012  . Class 3 severe obesity due to excess calories with serious comorbidity and body mass index (BMI) of 50.0 to 59.9 in adult (HCC) 09/18/2012  . Laparoscopic sleeve gastrectomy Dec 2014 05/14/2013  . Insulin resistance  05/24/2013  . Type 2 diabetes mellitus without complication, without long-term current use of insulin (HCC) 05/19/2017  . Trigger finger, left ring finger 10/23/2017  . Adjustment disorder with mixed anxiety and depressed mood 10/23/2017  . Pre-diabetes 11/19/2017  . Tonsillar mass 04/20/2018  . Tonsillar enlargement 04/20/2018  . Snoring 04/21/2018   Resolved Ambulatory Problems    Diagnosis Date Noted  . Diabetes mellitus, type 2 (HCC) 09/18/2012   Past Medical History:  Diagnosis Date  . Anemia   . Complication of anesthesia   . Depression   . Diabetes mellitus without complication (HCC)   . Drug use   . Exercise-induced asthma   . GERD (gastroesophageal reflux disease)   . Headache(784.0)   . Hypertension   . IBS (irritable bowel syndrome)   . Migraines   . Obesity   . Post-nasal drip   . Shortness of breath   . Sleep apnea      Review of Systems See HPI.     Objective:   Physical Exam Vitals reviewed.  Constitutional:      Appearance: Normal appearance. She is obese.  HENT:     Head: Normocephalic.     Right Ear: Tympanic membrane normal.     Left Ear: Tympanic membrane normal.     Nose: Nose normal.     Mouth/Throat:     Mouth: Mucous membranes are moist.  Eyes:     Conjunctiva/sclera: Conjunctivae normal.  Cardiovascular:     Rate and Rhythm: Normal rate and regular rhythm.     Pulses: Normal pulses.  Pulmonary:     Effort: Pulmonary effort is normal.     Breath sounds: Normal breath sounds.  Neurological:     General: No focal deficit present.     Mental Status: She is alert and oriented to person, place, and time.  Psychiatric:        Mood and Affect: Mood normal.           Assessment & Plan:  Marland KitchenMarland KitchenMelissa was seen today for weight check.  Diagnoses and all orders for this visit:  Type 2 diabetes mellitus without complication, without long-term current use of insulin (HCC) -     POCT glycosylated hemoglobin (Hb A1C)  Sinobronchitis -      albuterol (VENTOLIN HFA) 108 (90 Base) MCG/ACT inhaler; Inhale 2 puffs into the lungs every 6 (six) hours as needed. -     azithromycin (ZITHROMAX Z-PAK) 250 MG tablet; Take 2 tablets (500 mg) on  Day 1,  followed by 1 tablet (250 mg) once daily on Days 2 through 5.  Elevated blood pressure reading  Class 3 severe obesity due to excess calories with serious comorbidity and body mass index (BMI) of 50.0 to 59.9 in adult (HCC) -     Semaglutide-Weight Management (WEGOVY) 1.7 MG/0.75ML SOAJ; Inject 1.7 mg into the skin once a week.   .. Results for orders placed or performed in visit on 04/19/20  POCT glycosylated hemoglobin (Hb A1C)  Result Value Ref Range   Hemoglobin A1C 5.8 (A) 4.0 - 5.6 %   HbA1c POC (<> result, manual entry)     HbA1c, POC (prediabetic range)     HbA1c, POC (controlled diabetic range)     A1c has improved since last visit to 5.8. Continue on we will go [place] for weight loss and glucose control.  I did go ahead and send the increased dose. Blood pressure not controlled today.  She has been on over-the-counter medications and been coughing.  She will continue to keep a watch on this when she has her colonoscopy later this month and other doctors visits.  We will follow-up in 3 months.  Certainly reach out if blood pressure not improving. Foot exam up-to-date Eye exam needed.  Patient does have her Covid vaccine. Patient declined her flu vaccine today.  Patient is down 15 pounds in 2 months.  Discussed improving her walking to 3 times a week.  Encouraged continue to make better choices.  Increase protein decrease sugars and carbs.  Keep up the good work.  Goal weight of 290 in 3 months.  She does seem to have some sinobronchitis.  Given albuterol to use as needed.  Added Z-Pak.  Follow-up as needed or if symptoms worsen.  Continue on her allergy medication for prevention.  Encouraged zinc for immune support.

## 2020-04-20 NOTE — Progress Notes (Signed)
Thyroid looks much better. Optimal range is 1-2 so I suggest a slight increase in medication. If you are feeling great we can keep it the same. What would you like to do?

## 2020-04-21 ENCOUNTER — Other Ambulatory Visit: Payer: Self-pay | Admitting: Physician Assistant

## 2020-04-21 MED ORDER — LEVOTHYROXINE SODIUM 88 MCG PO TABS: 88.0000 ug | ORAL_TABLET | Freq: Every day | ORAL | 0 refills | Status: DC

## 2020-04-21 NOTE — Progress Notes (Signed)
Sent .

## 2020-04-28 ENCOUNTER — Other Ambulatory Visit (HOSPITAL_COMMUNITY)
Admission: RE | Admit: 2020-04-28 | Discharge: 2020-04-28 | Disposition: A | Discharge status: Home / Self Care | Payer: BC Managed Care – PPO | Source: Ambulatory Visit | Attending: Gastroenterology | Admitting: Gastroenterology

## 2020-04-28 DIAGNOSIS — Z20822 Contact with and (suspected) exposure to covid-19: Secondary | ICD-10-CM | POA: Diagnosis not present

## 2020-04-28 DIAGNOSIS — Z01812 Encounter for preprocedural laboratory examination: Secondary | ICD-10-CM | POA: Diagnosis not present

## 2020-04-28 LAB — SARS CORONAVIRUS 2 (TAT 6-24 HRS): SARS Coronavirus 2: NEGATIVE

## 2020-05-02 ENCOUNTER — Other Ambulatory Visit: Payer: Self-pay

## 2020-05-02 ENCOUNTER — Ambulatory Visit (HOSPITAL_COMMUNITY): Payer: BC Managed Care – PPO | Admitting: Anesthesiology

## 2020-05-02 ENCOUNTER — Encounter (HOSPITAL_COMMUNITY): Payer: Self-pay | Admitting: Gastroenterology

## 2020-05-02 ENCOUNTER — Ambulatory Visit (HOSPITAL_COMMUNITY)
Admission: RE | Admit: 2020-05-02 | Discharge: 2020-05-02 | Disposition: A | Discharge status: Home / Self Care | Payer: BC Managed Care – PPO | Source: Other Acute Inpatient Hospital | Attending: Gastroenterology | Admitting: Gastroenterology

## 2020-05-02 ENCOUNTER — Encounter (HOSPITAL_COMMUNITY)
Admission: RE | Disposition: A | Discharge status: Home / Self Care | Payer: Self-pay | Source: Other Acute Inpatient Hospital | Attending: Gastroenterology

## 2020-05-02 DIAGNOSIS — E669 Obesity, unspecified: Secondary | ICD-10-CM | POA: Insufficient documentation

## 2020-05-02 DIAGNOSIS — Q438 Other specified congenital malformations of intestine: Secondary | ICD-10-CM | POA: Diagnosis not present

## 2020-05-02 DIAGNOSIS — Z1211 Encounter for screening for malignant neoplasm of colon: Secondary | ICD-10-CM | POA: Diagnosis not present

## 2020-05-02 DIAGNOSIS — G4733 Obstructive sleep apnea (adult) (pediatric): Secondary | ICD-10-CM | POA: Diagnosis not present

## 2020-05-02 DIAGNOSIS — R195 Other fecal abnormalities: Secondary | ICD-10-CM

## 2020-05-02 DIAGNOSIS — K580 Irritable bowel syndrome with diarrhea: Secondary | ICD-10-CM | POA: Diagnosis not present

## 2020-05-02 DIAGNOSIS — K641 Second degree hemorrhoids: Secondary | ICD-10-CM | POA: Insufficient documentation

## 2020-05-02 DIAGNOSIS — K635 Polyp of colon: Secondary | ICD-10-CM | POA: Diagnosis not present

## 2020-05-02 DIAGNOSIS — Z6841 Body Mass Index (BMI) 40.0 and over, adult: Secondary | ICD-10-CM | POA: Diagnosis not present

## 2020-05-02 HISTORY — PX: BIOPSY: SHX5522

## 2020-05-02 HISTORY — DX: Other specified postprocedural states: Z98.890

## 2020-05-02 HISTORY — PX: POLYPECTOMY: SHX5525

## 2020-05-02 HISTORY — PX: COLONOSCOPY WITH PROPOFOL: SHX5780

## 2020-05-02 HISTORY — DX: Other specified postprocedural states: R11.2

## 2020-05-02 LAB — PREGNANCY, URINE: Preg Test, Ur: NEGATIVE

## 2020-05-02 LAB — GLUCOSE, CAPILLARY: Glucose-Capillary: 105 mg/dL — ABNORMAL HIGH (ref 70–99)

## 2020-05-02 SURGERY — COLONOSCOPY WITH PROPOFOL
Anesthesia: General

## 2020-05-02 MED ORDER — SODIUM CHLORIDE 0.9 % IV SOLN: INTRAVENOUS | Status: DC

## 2020-05-02 MED ORDER — LACTATED RINGERS IV SOLN: INTRAVENOUS | Status: DC | PRN

## 2020-05-02 MED ORDER — LIDOCAINE HCL (CARDIAC) PF 100 MG/5ML IV SOSY
PREFILLED_SYRINGE | INTRAVENOUS | Status: DC | PRN
  Administered 2020-05-02: 50 mg via INTRAVENOUS

## 2020-05-02 MED ORDER — PHENYLEPHRINE 40 MCG/ML (10ML) SYRINGE FOR IV PUSH (FOR BLOOD PRESSURE SUPPORT)
PREFILLED_SYRINGE | INTRAVENOUS | Status: DC | PRN
  Administered 2020-05-02: 40 ug via INTRAVENOUS
  Administered 2020-05-02 (×2): 80 ug via INTRAVENOUS
  Administered 2020-05-02 (×2): 40 ug via INTRAVENOUS

## 2020-05-02 MED ORDER — PROPOFOL 10 MG/ML IV BOLUS
INTRAVENOUS | Status: DC | PRN
  Administered 2020-05-02: 30 mg via INTRAVENOUS
  Administered 2020-05-02: 20 mg via INTRAVENOUS
  Administered 2020-05-02: 30 mg via INTRAVENOUS

## 2020-05-02 MED ORDER — LACTATED RINGERS IV SOLN: Freq: Once | INTRAVENOUS | Status: AC

## 2020-05-02 MED ORDER — PROPOFOL 500 MG/50ML IV EMUL
INTRAVENOUS | Status: DC | PRN
  Administered 2020-05-02: 150 ug/kg/min via INTRAVENOUS

## 2020-05-02 SURGICAL SUPPLY — 22 items

## 2020-05-02 NOTE — Anesthesia Postprocedure Evaluation (Signed)
Anesthesia Post Note  Patient: Monique P Brion  Procedure(s) Performed: COLONOSCOPY WITH PROPOFOL (N/A ) BIOPSY POLYPECTOMY     Patient location during evaluation: Endoscopy Anesthesia Type: MAC Level of consciousness: awake and alert Pain management: pain level controlled Vital Signs Assessment: post-procedure vital signs reviewed and stable Respiratory status: spontaneous breathing, nonlabored ventilation and respiratory function stable Cardiovascular status: blood pressure returned to baseline and stable Postop Assessment: no apparent nausea or vomiting Anesthetic complications: no   No complications documented.  Last Vitals:  Vitals:   05/02/20 1130 05/02/20 1140  BP: 140/77 136/72  Pulse: 93 78  Resp: 18 12  Temp:    SpO2: 99% 98%    Last Pain:  Vitals:   05/02/20 1140  TempSrc:   PainSc: 0-No pain                 Merlinda Frederick

## 2020-05-02 NOTE — Discharge Instructions (Signed)

## 2020-05-02 NOTE — Interval H&P Note (Signed)
History and Physical Interval Note:  05/02/2020 9:36 AM  Amanda Davidson  has presented today for surgery, with the diagnosis of z12.11.  The various methods of treatment have been discussed with the patient and family. After consideration of risks, benefits and other options for treatment, the patient has consented to  Procedure(s): COLONOSCOPY WITH PROPOFOL (N/A) as a surgical intervention.  The patient's history has been reviewed, patient examined, no change in status, stable for surgery.  I have reviewed the patient's chart and labs.  Questions were answered to the patient's satisfaction.     Verlin Dike Dorri Ozturk

## 2020-05-02 NOTE — Transfer of Care (Signed)
Immediate Anesthesia Transfer of Care Note  Patient: Amanda Davidson  Procedure(s) Performed: COLONOSCOPY WITH PROPOFOL (N/A ) BIOPSY POLYPECTOMY  Patient Location: PACU  Anesthesia Type:MAC  Level of Consciousness: awake, alert  and oriented  Airway & Oxygen Therapy: Patient Spontanous Breathing  Post-op Assessment: Report given to RN and Post -op Vital signs reviewed and stable  Post vital signs: Reviewed and stable  Last Vitals:  Vitals Value Taken Time  BP    Temp    Pulse 92 05/02/20 1128  Resp 23 05/02/20 1128  SpO2 100 % 05/02/20 1128  Vitals shown include unvalidated device data.  Last Pain:  Vitals:   05/02/20 0953  TempSrc: Temporal  PainSc: 0-No pain         Complications: No complications documented.

## 2020-05-02 NOTE — Anesthesia Preprocedure Evaluation (Signed)
Anesthesia Evaluation  Patient identified by MRN, date of birth, ID band Patient awake    Reviewed: Allergy & Precautions, NPO status , Patient's Chart, lab work & pertinent test results  History of Anesthesia Complications (+) PONV and history of anesthetic complications  Airway Mallampati: I  TM Distance: >3 FB Neck ROM: Full    Dental no notable dental hx.    Pulmonary asthma , sleep apnea and Continuous Positive Airway Pressure Ventilation ,    Pulmonary exam normal breath sounds clear to auscultation       Cardiovascular Exercise Tolerance: Poor hypertension, Normal cardiovascular exam Rhythm:Regular Rate:Normal     Neuro/Psych  Headaches, PSYCHIATRIC DISORDERS Anxiety Depression    GI/Hepatic GERD  ,  Endo/Other  diabetesHypothyroidism Morbid obesity  Renal/GU   negative genitourinary   Musculoskeletal negative musculoskeletal ROS (+)   Abdominal   Peds negative pediatric ROS (+)  Hematology  (+) anemia ,   Anesthesia Other Findings   Reproductive/Obstetrics negative OB ROS                             Anesthesia Physical Anesthesia Plan  ASA: IV  Anesthesia Plan: General   Post-op Pain Management:    Induction: Intravenous  PONV Risk Score and Plan: Propofol infusion, TIVA and Treatment may vary due to age or medical condition  Airway Management Planned: Natural Airway and Simple Face Mask  Additional Equipment:   Intra-op Plan:   Post-operative Plan:   Informed Consent: I have reviewed the patients History and Physical, chart, labs and discussed the procedure including the risks, benefits and alternatives for the proposed anesthesia with the patient or authorized representative who has indicated his/her understanding and acceptance.       Plan Discussed with: CRNA  Anesthesia Plan Comments:         Anesthesia Quick Evaluation

## 2020-05-02 NOTE — Op Note (Signed)
[facility] Patient Name: Amanda Davidson Procedure Date: 05/02/2020 MRN: [id number] Attending MD: Amanda Davidson , MD Date of Birth: 1971/03/24 CSN: [id number] Age: 49 Admit Type: Outpatient Procedure:                Colonoscopy Indications:              Screening for colorectal malignant neoplasm. No                            previous colonoscopy.                           Incidental - Change in bowel habits, loose stools Providers:                Amanda Locks, MD, Dwain Sarna, RN, Brion Aliment, Technician, Rozetta Nunnery, Technician Referring MD:              Medicines:                Monitored Anesthesia Care Complications:            No immediate complications. Estimated Blood Loss:     Estimated blood loss was minimal. Procedure:                Pre-Anesthesia Assessment:                           - Prior to the procedure, a History and Physical                            was performed, and patient medications and                            allergies were reviewed. The patient's tolerance of                            previous anesthesia was also reviewed. The risks                            and benefits of the procedure and the sedation                            options and risks were discussed with the patient.                            All questions were answered, and informed consent                            was obtained. Prior Anticoagulants: The patient has                            taken no previous anticoagulant or antiplatelet                            agents. ASA  Grade Assessment: III - A patient with                            severe systemic disease. After reviewing the risks                            and benefits, the patient was deemed in                            satisfactory condition to undergo the procedure.                           After obtaining informed consent, the colonoscope                             was passed under direct vision. Throughout the                            procedure, the patient's blood pressure, pulse, and                            oxygen saturations were monitored continuously. The                            PCF-H190DL ([id number] Olympus pediatric colonscope                            was introduced through the anus and advanced to the                            the cecum, identified by appendiceal orifice and                            ileocecal valve. The colonoscopy was technically                            difficult and complex due to a tortuous colon.                            Successful completion of the procedure was aided by                            using manual pressure. The patient tolerated the                            procedure well. The quality of the bowel                            preparation was good. The ileocecal valve,                            appendiceal orifice, and rectum were photographed. Scope In: 10:41:27 AM Scope Out: 11:22:19 AM Scope Withdrawal Time: 0 hours 18 minutes 59 seconds  Total  Procedure Duration: 0 hours 40 minutes 52 seconds  Findings:      Hemorrhoids were found on perianal exam.      A 3 mm polyp was found in the sigmoid colon. The polyp was sessile. The       polyp was removed with a cold snare. Resection and retrieval were       complete. Estimated blood loss was minimal.      The hepatic flexure was significantly tortuous. Advancing the scope       required using manual pressure.      The visualized mucosa was otherwise normal appearing throughout the       colon. Biopsies for histology were taken with a cold forceps from the       right colon and left colon for evaluation of microscopic colitis.       Estimated blood loss was minimal.      Non-bleeding internal hemorrhoids were found during retroflexion. The       hemorrhoids were small and Grade II (internal hemorrhoids that prolapse       but  reduce spontaneously). Impression:               - Hemorrhoids found on perianal exam.                           - One 3 mm polyp in the sigmoid colon, removed with                            a cold snare. Resected and retrieved.                           - Tortuous colon.                           - Normal mucosa in the entire examined colon.                            Biopsied.                           - Non-bleeding internal hemorrhoids. Moderate Sedation:      Not Applicable - Patient had care per Anesthesia. Recommendation:           - Patient has a contact number available for                            emergencies. The signs and symptoms of potential                            delayed complications were discussed with the                            patient. Return to normal activities tomorrow.                            Written discharge instructions were provided to the                            patient.                           -  Resume previous diet.                           - Continue present medications.                           - Await pathology results.                           - Repeat colonoscopy in 5-10 years for surveillance                            based on pathology results.                           - Return to GI clinic PRN.                           - Use fiber, for example Citrucel, Fibercon, Konsyl                            or Metamucil. Procedure Code(s):        --- Professional ---                           [phone], Colonoscopy, flexible; with removal of                            tumor(s), polyp(s), or other lesion(s) by snare                            technique                           45380, 59, Colonoscopy, flexible; with biopsy,                            single or multiple Diagnosis Code(s):        --- Professional ---                           Z12.11, Encounter for screening for malignant                            neoplasm of colon                            K64.1, Second degree hemorrhoids                           K63.5, Polyp of colon                           Q43.8, Other specified congenital malformations of                            intestine CPT copyright 2019 American Medical Association. All rights reserved. The codes documented in this  report are preliminary and upon coder review may  be revised to meet current compliance requirements. Amanda LocksVito Markeshia Giebel, MD 05/02/2020 11:28:46 AM Number of Addenda: 0

## 2020-05-02 NOTE — H&P (Signed)
    P  Chief Complaint:    Colon cancer screening, change stools, loose stools   HPI:     Patient is a 49 y.o. female with a history of diabetes, depression, obesity (BMI 51.2), migraines, HTN, hypothyroidism, OSA (prescribed CPAP), gastric sleeve with ventral hernia repair 2014presenting [facility] for colonoscopy.  She was recently seen in the GI clinic for evaluation of colon cancer screening along with change in bowel habits.  Scheduled for procedure at [facility] due to BMI > 50.  GI history from 03/30/2020 as below:  No previous colon cancer screening, and due for age-appropriate, average risk screening.  Would like to proceed with colonoscopy.  She reports a history of intermittent loose stools for many years. Tends to be post prandial, especially worse with greasy or spicy foods.  Her baseline is 2-3 soft stools/day, then will occasionally have postprandial loose stools but seem to be dietary related. No abdominal pain, n/v/f/c. No hematochezia, melena. Has not trialed any meds, supplements, fiber, dietary mods. Weight stable.   -12/14/2019: TSH 6.3.  Normal CBC, CMP.  A1c 6.2%  Maternal great grandmother with CRC, mother with colon polyps. Otherwise, no FHx of GI malignancy, IBD.   Review of systems:     No chest pain, no SOB, no fevers, no urinary sx   Past Medical History:  Diagnosis Date  . Anemia   . Asthma   . Complication of anesthesia    NAUSEA  . Depression   . Diabetes mellitus without complication (HCC)   . Drug use   . Exercise-induced asthma   . GERD (gastroesophageal reflux disease)   . Headache(784.0)    MIGRAINES  . Hypertension   . Hypothyroidism   . IBS (irritable bowel syndrome)   . Migraines   . Obesity   . Post-nasal drip   . Shortness of breath   . Sleep apnea    C-PAP but doesnt use it regularly   . Vitamin D deficiency     Patient's surgical history, family medical history, social history, medications and  allergies were all reviewed in Epic    Current Facility-Administered Medications  Medication Dose Route Frequency Provider Last Rate Last Admin  . 0.9 %  sodium chloride infusion   Intravenous Continuous Rehaan Viloria V, DO        Physical Exam:     Wt (!) 143.8 kg   BMI 51.17 kg/m   GENERAL:  Pleasant female in NAD PSYCH: : Cooperative, normal affect EENT:  conjunctiva pink, mucous membranes moist, neck supple without masses CARDIAC:  RRR, no murmur heard, no peripheral edema PULM: Normal respiratory effort, lungs CTA bilaterally, no wheezing ABDOMEN:  Nondistended, soft, nontender. No obvious masses, no hepatomegaly,  normal bowel sounds SKIN:  turgor, no lesions seen Musculoskeletal:  Normal muscle tone, normal strength NEURO: Alert and oriented x 3, no focal neurologic deficits   IMPRESSION and PLAN:    1) Colon cancer screening 2) Loose stools/change in bowel habits 3) Obesity (BMI > 50) 4) OSA -Colonoscopy for age-appropriate, average risk CRC screening -Can certainly evaluate for additional mucosal/luminal pathology with colonoscopy today -Additional recommendations pending endoscopic findings          Shellia Cleverly ,DO, FACG 05/02/2020, 9:32 AM

## 2020-05-03 ENCOUNTER — Encounter (HOSPITAL_COMMUNITY): Payer: Self-pay | Admitting: Gastroenterology

## 2020-05-04 ENCOUNTER — Encounter: Payer: Self-pay | Admitting: Gastroenterology

## 2020-05-04 LAB — SURGICAL PATHOLOGY

## 2020-07-14 ENCOUNTER — Other Ambulatory Visit: Payer: Self-pay | Admitting: Physician Assistant

## 2020-07-14 DIAGNOSIS — E039 Hypothyroidism, unspecified: Secondary | ICD-10-CM

## 2020-07-21 ENCOUNTER — Ambulatory Visit (INDEPENDENT_AMBULATORY_CARE_PROVIDER_SITE_OTHER): Payer: BC Managed Care – PPO | Admitting: Physician Assistant

## 2020-07-21 ENCOUNTER — Other Ambulatory Visit: Payer: Self-pay | Admitting: Physician Assistant

## 2020-07-21 ENCOUNTER — Encounter: Payer: Self-pay | Admitting: Physician Assistant

## 2020-07-21 ENCOUNTER — Other Ambulatory Visit: Payer: Self-pay

## 2020-07-21 VITALS — BP 134/76 | HR 74 | Ht 66.0 in | Wt 298.0 lb

## 2020-07-21 DIAGNOSIS — E039 Hypothyroidism, unspecified: Secondary | ICD-10-CM | POA: Diagnosis not present

## 2020-07-21 DIAGNOSIS — I10 Essential (primary) hypertension: Secondary | ICD-10-CM

## 2020-07-21 DIAGNOSIS — E119 Type 2 diabetes mellitus without complications: Secondary | ICD-10-CM | POA: Diagnosis not present

## 2020-07-21 DIAGNOSIS — Z6841 Body Mass Index (BMI) 40.0 and over, adult: Secondary | ICD-10-CM | POA: Diagnosis not present

## 2020-07-21 LAB — POCT GLYCOSYLATED HEMOGLOBIN (HGB A1C): Hemoglobin A1C: 5.7 % — AB (ref 4.0–5.6)

## 2020-07-21 MED ORDER — WEGOVY 2.4 MG/0.75ML ~~LOC~~ SOAJ: 2.4000 mg | SUBCUTANEOUS | 2 refills | Status: DC

## 2020-07-21 NOTE — Telephone Encounter (Signed)
Needs PA 

## 2020-07-21 NOTE — Progress Notes (Signed)
Subjective:    Patient ID: Amanda Davidson, female    DOB: Feb 10, 1971, 50 y.o.   MRN: [id number]  HPI  Pt is a 50 yo obese female with HTN, T2DM, OSA, hypothyroidism who presents to the clinic for medication follow up.  In august last year was 327lbs. Now 298. She has lost almost 30lbs. She has lost 12lbs since November. She is checking her sugars and running 110 to 130 in am and down from 150's. She denies any hypoglycemic events. No open sores or wounds. She is making diet changes but not exercising. No CP, palpitations, headaches or vision changes. She is only on wegovy 1.7mg . she is due for increase and next dose.   .. Active Ambulatory Problems    Diagnosis Date Noted  . OSA (obstructive sleep apnea) 05/24/2012  . Hypothyroidism 05/24/2012  . Migraine 05/24/2012  . Vitamin D deficiency 05/24/2012  . Asthma 05/24/2012  . Depression with anxiety 05/24/2012  . Hypertension, essential, benign 06/30/2012  . Asthma, moderate persistent 09/18/2012  . Class 3 severe obesity due to excess calories with serious comorbidity and body mass index (BMI) of 45.0 to 49.9 in adult (HCC) 09/18/2012  . Laparoscopic sleeve gastrectomy Dec 2014 05/14/2013  . Insulin resistance 05/24/2013  . Type 2 diabetes mellitus without complication, without long-term current use of insulin (HCC) 05/19/2017  . Trigger finger, left ring finger 10/23/2017  . Adjustment disorder with mixed anxiety and depressed mood 10/23/2017  . Pre-diabetes 11/19/2017  . Tonsillar mass 04/20/2018  . Tonsillar enlargement 04/20/2018  . Snoring 04/21/2018  . Change in stool   . Colon cancer screening   . Polyp of sigmoid colon   . Grade II internal hemorrhoids    Resolved Ambulatory Problems    Diagnosis Date Noted  . Diabetes mellitus, type 2 (HCC) 09/18/2012   Past Medical History:  Diagnosis Date  . Anemia   . Complication of anesthesia   . Depression   . Diabetes mellitus without complication (HCC)   . Drug use    . Exercise-induced asthma   . GERD (gastroesophageal reflux disease)   . Headache(784.0)   . Hypertension   . IBS (irritable bowel syndrome)   . Migraines   . Obesity   . PONV (postoperative nausea and vomiting)   . Post-nasal drip   . Shortness of breath   . Sleep apnea      Review of Systems  All other systems reviewed and are negative.      Objective:   Physical Exam Vitals reviewed.  Constitutional:      Appearance: Normal appearance. She is obese.  Cardiovascular:     Rate and Rhythm: Normal rate and regular rhythm.     Pulses: Normal pulses.     Heart sounds: Normal heart sounds.  Pulmonary:     Effort: Pulmonary effort is normal.     Breath sounds: Normal breath sounds.  Musculoskeletal:     Right lower leg: No edema.     Left lower leg: No edema.  Neurological:     General: No focal deficit present.     Mental Status: She is alert.  Psychiatric:        Mood and Affect: Mood normal.           Assessment & Plan:  Marland KitchenMarland KitchenMelissa was seen today for diabetes.  Diagnoses and all orders for this visit:  Type 2 diabetes mellitus without complication, without long-term current use of insulin (HCC) -     POCT  glycosylated hemoglobin (Hb A1C) -     Semaglutide-Weight Management (WEGOVY) 2.4 MG/0.75ML SOAJ; Inject 2.4 mg into the skin once a week.  Class 3 severe obesity due to excess calories with serious comorbidity and body mass index (BMI) of 45.0 to 49.9 in adult (HCC) -     Semaglutide-Weight Management (WEGOVY) 2.4 MG/0.75ML SOAJ; Inject 2.4 mg into the skin once a week.  Hypertension, essential, benign   .Marland Kitchen Results for orders placed or performed in visit on 07/21/20  POCT glycosylated hemoglobin (Hb A1C)  Result Value Ref Range   Hemoglobin A1C 5.7 (A) 4.0 - 5.6 %   HbA1c POC (<> result, manual entry)     HbA1c, POC (prediabetic range)     HbA1c, POC (controlled diabetic range)     A1C is great.  Stay on same medication.  Increased wegovy to  help with weight loss.  BP to goal. Not on ace/arb. Not on statin. Pt declined.  Foot and eye exam UTD.  Declined all vaccines.   Continue to work on weight loss. Add exercise to diet changes.  Follow up in 3 months.

## 2020-07-22 LAB — TSH: TSH: 2.93 mIU/L

## 2020-07-24 ENCOUNTER — Encounter: Payer: Self-pay | Admitting: Physician Assistant

## 2020-07-24 DIAGNOSIS — E119 Type 2 diabetes mellitus without complications: Secondary | ICD-10-CM

## 2020-07-24 NOTE — Telephone Encounter (Signed)
TSH improved a bit more. Still not quite to optimal range. How are you feeling at far as any hypothyroid symptoms?

## 2020-07-25 MED ORDER — WEGOVY 2.4 MG/0.75ML ~~LOC~~ SOAJ: 2.4000 mg | SUBCUTANEOUS | 2 refills | Status: DC

## 2020-07-25 MED ORDER — LEVOTHYROXINE SODIUM 100 MCG PO TABS: 100.0000 ug | ORAL_TABLET | Freq: Every day | ORAL | 1 refills | Status: DC

## 2020-07-27 ENCOUNTER — Telehealth: Payer: Self-pay | Admitting: Neurology

## 2020-07-27 NOTE — Telephone Encounter (Signed)
Prior Authorization for Wegovy submitted via covermymeds. Awaiting response.  

## 2020-07-27 NOTE — Telephone Encounter (Signed)
Your information has been submitted to [employer]. To check for an updated outcome later, reopen this PA request from your dashboard. If [employer] has not responded to your request within 24 hours, contact [employer] at [phone]. If you think there may be a problem with your PA request, use our live chat feature at the bottom right.    

## 2020-07-28 NOTE — Telephone Encounter (Signed)
Reginal Lutes denied - insurance does not cover this medication.   LMOM letting patient know. Told her about coupon on website, she is to call with any questions.

## 2020-08-24 DIAGNOSIS — K023 Arrested dental caries: Secondary | ICD-10-CM | POA: Diagnosis not present

## 2020-09-18 ENCOUNTER — Encounter: Payer: Self-pay | Admitting: Physician Assistant

## 2020-09-18 DIAGNOSIS — K429 Umbilical hernia without obstruction or gangrene: Secondary | ICD-10-CM

## 2020-09-20 ENCOUNTER — Other Ambulatory Visit: Payer: Self-pay | Admitting: Physician Assistant

## 2020-09-29 ENCOUNTER — Other Ambulatory Visit: Payer: Self-pay | Admitting: Physician Assistant

## 2020-10-02 ENCOUNTER — Other Ambulatory Visit: Payer: Self-pay

## 2020-10-02 ENCOUNTER — Telehealth: Payer: Self-pay

## 2020-10-02 DIAGNOSIS — R112 Nausea with vomiting, unspecified: Secondary | ICD-10-CM

## 2020-10-02 MED ORDER — ONDANSETRON HCL 8 MG PO TABS: 8.0000 mg | ORAL_TABLET | Freq: Three times a day (TID) | ORAL | 0 refills | Status: DC | PRN

## 2020-10-02 NOTE — Telephone Encounter (Signed)
Rx sent, pt notified. 

## 2020-10-02 NOTE — Telephone Encounter (Signed)
Amanda Davidson called and said she started the [facility] 2.4 yesterday and is having very bad nausea and vomiting. Pt asked if something could be sent in for the nausea. Thanks.

## 2020-10-02 NOTE — Telephone Encounter (Signed)
Ok to send zofran 8mg  every 6 hours as needed for nausea. #20

## 2020-10-26 ENCOUNTER — Other Ambulatory Visit: Payer: Self-pay | Admitting: Physician Assistant

## 2020-10-27 ENCOUNTER — Ambulatory Visit: Payer: BC Managed Care – PPO | Admitting: Physician Assistant

## 2020-11-03 ENCOUNTER — Ambulatory Visit: Payer: BC Managed Care – PPO | Admitting: Physician Assistant

## 2020-11-07 ENCOUNTER — Telehealth: Payer: Self-pay

## 2020-11-07 NOTE — Telephone Encounter (Signed)
PA submitted for Wegovy, awaiting response.

## 2020-11-16 DIAGNOSIS — K430 Incisional hernia with obstruction, without gangrene: Secondary | ICD-10-CM | POA: Diagnosis not present

## 2020-12-13 DIAGNOSIS — L820 Inflamed seborrheic keratosis: Secondary | ICD-10-CM | POA: Diagnosis not present

## 2020-12-13 DIAGNOSIS — D1801 Hemangioma of skin and subcutaneous tissue: Secondary | ICD-10-CM | POA: Diagnosis not present

## 2020-12-13 DIAGNOSIS — D485 Neoplasm of uncertain behavior of skin: Secondary | ICD-10-CM | POA: Diagnosis not present

## 2020-12-13 DIAGNOSIS — D225 Melanocytic nevi of trunk: Secondary | ICD-10-CM | POA: Diagnosis not present

## 2020-12-18 ENCOUNTER — Other Ambulatory Visit: Payer: Self-pay | Admitting: Surgery

## 2020-12-18 DIAGNOSIS — K432 Incisional hernia without obstruction or gangrene: Secondary | ICD-10-CM

## 2020-12-25 ENCOUNTER — Ambulatory Visit
Admission: RE | Admit: 2020-12-25 | Discharge: 2020-12-25 | Disposition: A | Discharge status: Home / Self Care | Payer: BC Managed Care – PPO | Source: Ambulatory Visit | Attending: Surgery | Admitting: Surgery

## 2020-12-25 DIAGNOSIS — K432 Incisional hernia without obstruction or gangrene: Secondary | ICD-10-CM

## 2020-12-25 DIAGNOSIS — K8020 Calculus of gallbladder without cholecystitis without obstruction: Secondary | ICD-10-CM | POA: Diagnosis not present

## 2021-01-04 ENCOUNTER — Ambulatory Visit: Payer: Self-pay | Admitting: Surgery

## 2021-01-04 MED ORDER — POLYETHYLENE GLYCOL 3350 17 GM/SCOOP PO POWD
1.0000 | Freq: Once | ORAL | Status: AC
Start: 2021-01-04 — End: ?

## 2021-01-12 ENCOUNTER — Other Ambulatory Visit: Payer: Self-pay | Admitting: Physician Assistant

## 2021-01-12 DIAGNOSIS — G43009 Migraine without aura, not intractable, without status migrainosus: Secondary | ICD-10-CM

## 2021-02-14 NOTE — Patient Instructions (Signed)
DUE TO COVID-19 ONLY ONE VISITOR IS ALLOWED TO COME WITH YOU AND STAY IN THE WAITING ROOM ONLY DURING PRE OP AND PROCEDURE.   **NO VISITORS ARE ALLOWED IN THE SHORT STAY AREA OR RECOVERY ROOM!!**  IF YOU WILL BE ADMITTED INTO THE HOSPITAL YOU ARE ALLOWED ONLY TWO SUPPORT PEOPLE DURING VISITATION HOURS ONLY (10AM -8PM)   The support person(s) may change daily. The support person(s) must pass our screening, gel in and out, and wear a mask at all times, including in the patient's room. Patients must also wear a mask when staff or their support person are in the room.  No visitors under the age of 7. Any visitor under the age of 56 must be accompanied by an adult.    COVID SWAB TESTING MUST BE COMPLETED ON:  02/23/21 **MUST PRESENT COMPLETED FORM AT TESTING SITE**    [address] [place] (backside of the building) You are not required to quarantine, however you are required to wear a well-fitted mask when you are out and around people not in your household.  Hand Hygiene often Do NOT share personal items Notify your provider if you are in close contact with someone who has COVID or you develop fever 100.4 or greater, new onset of sneezing, cough, sore throat, shortness of breath or body aches.  [facility] Entrance [address], [address], must go inside of the hospital, NOT A DRIVE THRU!  (Must self quarantine after testing. Follow instructions on handout.)       Your procedure is scheduled on: 02/27/21   Report to [facility] Main  Entrance    Report to admitting at : 12:45 PM   Call this number if you have problems the morning of surgery [phone]   Do not eat food :After Midnight.   May have liquids until: 12:00 PM    day of surgery  CLEAR LIQUID DIET  Foods Allowed                                                                     Foods Excluded  Water, Black Coffee and tea, regular and decaf                              liquids that you cannot  Plain Jell-O in any flavor  (No red)                                           see through such as: Fruit ices (not with fruit pulp)                                     milk, soups, orange juice              Iced Popsicles (No red)  All solid food                                   Apple juices Sports drinks like Gatorade (No red) Lightly seasoned clear broth or consume(fat free) Sugar,   Sample Menu Breakfast                                Lunch                                     Supper Cranberry juice                    Beef broth                            Chicken broth Jell-O                                     Grape juice                           Apple juice Coffee or tea                        Jell-O                                      Popsicle                                                Coffee or tea                        Coffee or tea      Complete one Gatorade drink the day of surgery at: 12:00 PM.       the day of surgery.     The day of surgery:  Drink ONE (1) Pre-Surgery Clear Ensure or G2 by am the morning of surgery. Drink in one sitting. Do not sip.  This drink was given to you during your hospital  pre-op appointment visit. Nothing else to drink after completing the  Pre-Surgery Clear Ensure or G2.          If you have questions, please contact your surgeon's office.  Eat a light diet the day before surgery.  Examples including soups, broths, toast, yogurt, mashed potatoes.  Things to avoid include carbonated beverages (fizzy beverages), raw fruits and raw vegetables, or beans.   If your bowels are filled with gas, your surgeon will have difficulty visualizing your pelvic organs which increases your surgical risks.     Oral Hygiene is also important to reduce your risk of infection.                                    Remember - BRUSH YOUR TEETH  THE MORNING OF SURGERY WITH YOUR REGULAR  TOOTHPASTE   Do NOT smoke after Midnight   Take these medicines the morning of surgery with A SIP OF WATER: synthroid.Use inhalers as usual.Imitrex(sumatriptan) as needed.  DO NOT TAKE ANY ORAL DIABETIC MEDICATIONS DAY OF YOUR SURGERY                              You may not have any metal on your body including hair pins, jewelry, and body piercing             Do not wear make-up, lotions, powders, perfumes/cologne, or deodorant  Do not wear nail polish including gel and S&S, artificial/acrylic nails, or any other type of covering on natural nails including finger and toenails. If you have artificial nails, gel coating, etc. that needs to be removed by a nail salon please have this removed prior to surgery or surgery may need to be canceled/ delayed if the surgeon/ anesthesia feels like they are unable to be safely monitored.   Do not shave  48 hours prior to surgery.    Do not bring valuables to the hospital. [facility] IS NOT             RESPONSIBLE   FOR VALUABLES.   Contacts, dentures or bridgework may not be worn into surgery.   Bring small overnight bag day of surgery.    Patients discharged the day of surgery will not be allowed to drive home.   Special Instructions: Bring a copy of your healthcare power of attorney and living will documents         the day of surgery if you haven't scanned them in before.              Please read over the following fact sheets you were given: IF YOU HAVE QUESTIONS ABOUT YOUR PRE OP INSTRUCTIONS PLEASE CALL [phone]   [place] - Preparing for Surgery Before surgery, you can play an important role.  Because skin is not sterile, your skin needs to be as free of germs as possible.  You can reduce the number of germs on your skin by washing with CHG (chlorahexidine gluconate) soap before surgery.  CHG is an antiseptic cleaner which kills germs and bonds with the skin to continue killing germs even after washing. Please DO NOT use if you  have an allergy to CHG or antibacterial soaps.  If your skin becomes reddened/irritated stop using the CHG and inform your nurse when you arrive at Short Stay. Do not shave (including legs and underarms) for at least 48 hours prior to the first CHG shower.  You may shave your face/neck. Please follow these instructions carefully:  1.  Shower with CHG Soap the night before surgery and the  morning of Surgery.  2.  If you choose to wash your hair, wash your hair first as usual with your  normal  shampoo.  3.  After you shampoo, rinse your hair and body thoroughly to remove the  shampoo.                           4.  Use CHG as you would any other liquid soap.  You can apply chg directly  to the skin and wash                       Gently  with a scrungie or clean washcloth.  5.  Apply the CHG Soap to your body ONLY FROM THE NECK DOWN.   Do not use on face/ open                           Wound or open sores. Avoid contact with eyes, ears mouth and genitals (private parts).                       Wash face,  Genitals (private parts) with your normal soap.             6.  Wash thoroughly, paying special attention to the area where your surgery  will be performed.  7.  Thoroughly rinse your body with warm water from the neck down.  8.  DO NOT shower/wash with your normal soap after using and rinsing off  the CHG Soap.                9.  Pat yourself dry with a clean towel.            10.  Wear clean pajamas.            11.  Place clean sheets on your bed the night of your first shower and do not  sleep with pets. Day of Surgery : Do not apply any lotions/deodorants the morning of surgery.  Please wear clean clothes to the hospital/surgery center.  FAILURE TO FOLLOW THESE INSTRUCTIONS MAY RESULT IN THE CANCELLATION OF YOUR SURGERY PATIENT SIGNATURE_________________________________  NURSE  SIGNATURE__________________________________  ________________________________________________________________________

## 2021-02-15 ENCOUNTER — Other Ambulatory Visit: Payer: Self-pay | Admitting: Physician Assistant

## 2021-02-16 MED ORDER — LEVOTHYROXINE SODIUM 100 MCG PO TABS: 100.0000 ug | ORAL_TABLET | Freq: Every day | ORAL | 0 refills | Status: DC

## 2021-02-17 ENCOUNTER — Other Ambulatory Visit: Payer: Self-pay | Admitting: Physician Assistant

## 2021-02-17 DIAGNOSIS — G43009 Migraine without aura, not intractable, without status migrainosus: Secondary | ICD-10-CM

## 2021-02-19 ENCOUNTER — Encounter (HOSPITAL_COMMUNITY): Payer: Self-pay

## 2021-02-19 ENCOUNTER — Encounter (HOSPITAL_COMMUNITY)
Admission: RE | Admit: 2021-02-19 | Discharge: 2021-02-19 | Disposition: A | Discharge status: Home / Self Care | Payer: BC Managed Care – PPO | Source: Ambulatory Visit | Attending: Surgery | Admitting: Surgery

## 2021-02-19 ENCOUNTER — Other Ambulatory Visit: Payer: Self-pay

## 2021-02-19 DIAGNOSIS — Z01818 Encounter for other preprocedural examination: Secondary | ICD-10-CM | POA: Diagnosis not present

## 2021-02-19 HISTORY — DX: Pneumonia, unspecified organism: J18.9

## 2021-02-19 LAB — BASIC METABOLIC PANEL
Anion gap: 6 (ref 5–15)
BUN: 11 mg/dL (ref 6–20)
CO2: 28 mmol/L (ref 22–32)
Calcium: 9 mg/dL (ref 8.9–10.3)
Chloride: 103 mmol/L (ref 98–111)
Creatinine, Ser: 0.58 mg/dL (ref 0.44–1.00)
GFR, Estimated: >60 mL/min (ref >60)
Glucose, Bld: 108 mg/dL — ABNORMAL HIGH (ref 70–99)
Potassium: 4.2 mmol/L (ref 3.5–5.1)
Sodium: 137 mmol/L (ref 135–145)

## 2021-02-19 LAB — GLUCOSE, CAPILLARY: Glucose-Capillary: 108 mg/dL — ABNORMAL HIGH (ref 70–99)

## 2021-02-19 LAB — CBC
HCT: 40.3 % (ref 36.0–46.0)
Hemoglobin: 12.9 g/dL (ref 12.0–15.0)
MCH: 29.1 pg (ref 26.0–34.0)
MCHC: 32 g/dL (ref 30.0–36.0)
MCV: 90.8 fL (ref 80.0–100.0)
Platelets: 289 10*3/uL (ref 150–400)
RBC: 4.44 MIL/uL (ref 3.87–5.11)
RDW: 14.6 % (ref 11.5–15.5)
WBC: 8.4 10*3/uL (ref 4.0–10.5)
nRBC: 0 % (ref 0.0–0.2)

## 2021-02-19 LAB — HEMOGLOBIN A1C
Hgb A1c MFr Bld: 5.6 % (ref 4.8–5.6)
Mean Plasma Glucose: 114.02 mg/dL

## 2021-02-19 NOTE — Progress Notes (Signed)
COVID Vaccine Completed: Yes Date COVID Vaccine completed:03/08/20. X 2 COVID vaccine manufacturer: Pfizer      PCP - Tandy Gaw: PA LOV: 07/21/20 Cardiologist -   Chest x-ray -  EKG -  Stress Test -  ECHO -  Cardiac Cath -  Pacemaker/ICD device last checked:  Sleep Study - Yes CPAP - Yes  Fasting Blood Sugar - 100's Checks Blood Sugar ___1__ times a week.  Blood Thinner Instructions: Aspirin Instructions: Last Dose:  Anesthesia review: Hx: DIA,HTN,OSA(CPAP)  Patient denies shortness of breath, fever, cough and chest pain at PAT appointment   Patient verbalized understanding of instructions that were given to them at the PAT appointment. Patient was also instructed that they will need to review over the PAT instructions again at home before surgery.

## 2021-02-23 ENCOUNTER — Other Ambulatory Visit: Payer: Self-pay | Admitting: Surgery

## 2021-02-24 LAB — SARS CORONAVIRUS 2 (TAT 6-24 HRS): SARS Coronavirus 2: NEGATIVE

## 2021-02-27 ENCOUNTER — Ambulatory Visit (HOSPITAL_COMMUNITY): Payer: BC Managed Care – PPO | Admitting: Certified Registered Nurse Anesthetist

## 2021-02-27 ENCOUNTER — Observation Stay (HOSPITAL_COMMUNITY)
Admission: RE | Admit: 2021-02-27 | Discharge: 2021-03-01 | Disposition: A | Discharge status: Home / Self Care | Payer: BC Managed Care – PPO | Attending: Surgery | Admitting: Surgery

## 2021-02-27 ENCOUNTER — Encounter (HOSPITAL_COMMUNITY): Payer: Self-pay | Admitting: Surgery

## 2021-02-27 ENCOUNTER — Encounter (HOSPITAL_COMMUNITY)
Admission: RE | Disposition: A | Discharge status: Home / Self Care | Payer: Self-pay | Source: Home / Self Care | Attending: Surgery

## 2021-02-27 ENCOUNTER — Other Ambulatory Visit: Payer: Self-pay

## 2021-02-27 DIAGNOSIS — E039 Hypothyroidism, unspecified: Secondary | ICD-10-CM | POA: Diagnosis not present

## 2021-02-27 DIAGNOSIS — E119 Type 2 diabetes mellitus without complications: Secondary | ICD-10-CM | POA: Diagnosis not present

## 2021-02-27 DIAGNOSIS — J45909 Unspecified asthma, uncomplicated: Secondary | ICD-10-CM | POA: Diagnosis not present

## 2021-02-27 DIAGNOSIS — G4733 Obstructive sleep apnea (adult) (pediatric): Secondary | ICD-10-CM | POA: Diagnosis not present

## 2021-02-27 DIAGNOSIS — K436 Other and unspecified ventral hernia with obstruction, without gangrene: Secondary | ICD-10-CM | POA: Diagnosis not present

## 2021-02-27 DIAGNOSIS — I10 Essential (primary) hypertension: Secondary | ICD-10-CM | POA: Diagnosis not present

## 2021-02-27 DIAGNOSIS — Z8719 Personal history of other diseases of the digestive system: Secondary | ICD-10-CM

## 2021-02-27 DIAGNOSIS — E559 Vitamin D deficiency, unspecified: Secondary | ICD-10-CM | POA: Diagnosis not present

## 2021-02-27 DIAGNOSIS — Z9889 Other specified postprocedural states: Secondary | ICD-10-CM

## 2021-02-27 DIAGNOSIS — Z79899 Other long term (current) drug therapy: Secondary | ICD-10-CM | POA: Diagnosis not present

## 2021-02-27 DIAGNOSIS — K219 Gastro-esophageal reflux disease without esophagitis: Secondary | ICD-10-CM | POA: Diagnosis not present

## 2021-02-27 DIAGNOSIS — K432 Incisional hernia without obstruction or gangrene: Secondary | ICD-10-CM | POA: Diagnosis not present

## 2021-02-27 HISTORY — PX: LAPAROSCOPIC ASSISTED VENTRAL HERNIA REPAIR: SHX6312

## 2021-02-27 LAB — PREGNANCY, URINE: Preg Test, Ur: NEGATIVE

## 2021-02-27 LAB — CBC
HCT: 40 % (ref 36.0–46.0)
Hemoglobin: 13.1 g/dL (ref 12.0–15.0)
MCH: 29.5 pg (ref 26.0–34.0)
MCHC: 32.8 g/dL (ref 30.0–36.0)
MCV: 90.1 fL (ref 80.0–100.0)
Platelets: 294 10*3/uL (ref 150–400)
RBC: 4.44 MIL/uL (ref 3.87–5.11)
RDW: 14.1 % (ref 11.5–15.5)
WBC: 16.5 10*3/uL — ABNORMAL HIGH (ref 4.0–10.5)
nRBC: 0 % (ref 0.0–0.2)

## 2021-02-27 LAB — GLUCOSE, CAPILLARY: Glucose-Capillary: 153 mg/dL — ABNORMAL HIGH (ref 70–99)

## 2021-02-27 LAB — CREATININE, SERUM
Creatinine, Ser: 0.79 mg/dL (ref 0.44–1.00)
GFR, Estimated: >60 mL/min (ref >60)

## 2021-02-27 SURGERY — REPAIR, HERNIA, VENTRAL, LAPAROSCOPY-ASSISTED
Anesthesia: General | Site: Abdomen

## 2021-02-27 MED ORDER — ONDANSETRON 4 MG PO TBDP: 4.0000 mg | ORAL_TABLET | Freq: Four times a day (QID) | ORAL | Status: DC | PRN

## 2021-02-27 MED ORDER — HEPARIN SODIUM (PORCINE) 5000 UNIT/ML IJ SOLN
5000.0000 [IU] | Freq: Once | INTRAMUSCULAR | Status: AC
  Administered 2021-02-27: 5000 [IU] via SUBCUTANEOUS
  Filled 2021-02-27: qty 1

## 2021-02-27 MED ORDER — BUPIVACAINE LIPOSOME 1.3 % IJ SUSP
INTRAMUSCULAR | Status: AC
  Filled 2021-02-27: qty 20

## 2021-02-27 MED ORDER — SODIUM CHLORIDE 0.9 % IV SOLN
2.0000 g | Freq: Two times a day (BID) | INTRAVENOUS | Status: AC
  Administered 2021-02-28: 2 g via INTRAVENOUS
  Filled 2021-02-27: qty 2

## 2021-02-27 MED ORDER — LIDOCAINE HCL 2 % IJ SOLN
INTRAMUSCULAR | Status: AC
  Filled 2021-02-27: qty 20

## 2021-02-27 MED ORDER — PROPOFOL 10 MG/ML IV BOLUS
INTRAVENOUS | Status: DC | PRN
  Administered 2021-02-27: 170 mg via INTRAVENOUS

## 2021-02-27 MED ORDER — MIDAZOLAM HCL 2 MG/2ML IJ SOLN
INTRAMUSCULAR | Status: AC
  Filled 2021-02-27: qty 2

## 2021-02-27 MED ORDER — HEPARIN SODIUM (PORCINE) 5000 UNIT/ML IJ SOLN
5000.0000 [IU] | Freq: Three times a day (TID) | INTRAMUSCULAR | Status: DC
  Administered 2021-02-27 – 2021-03-01 (×5): 5000 [IU] via SUBCUTANEOUS
  Filled 2021-02-27 (×5): qty 1

## 2021-02-27 MED ORDER — LIDOCAINE 2% (20 MG/ML) 5 ML SYRINGE
INTRAMUSCULAR | Status: DC | PRN
Start: 2021-02-27 — End: 2021-02-27
  Administered 2021-02-27: 1.5 mg/kg/h via INTRAVENOUS

## 2021-02-27 MED ORDER — KCL IN DEXTROSE-NACL 20-5-0.45 MEQ/L-%-% IV SOLN
INTRAVENOUS | Status: DC
  Filled 2021-02-27 (×2): qty 1000

## 2021-02-27 MED ORDER — ONDANSETRON HCL 4 MG/2ML IJ SOLN: 4.0000 mg | Freq: Four times a day (QID) | INTRAMUSCULAR | Status: DC | PRN

## 2021-02-27 MED ORDER — ALBUTEROL SULFATE (2.5 MG/3ML) 0.083% IN NEBU: 2.5000 mg | INHALATION_SOLUTION | Freq: Four times a day (QID) | RESPIRATORY_TRACT | Status: DC | PRN

## 2021-02-27 MED ORDER — SUCCINYLCHOLINE CHLORIDE 200 MG/10ML IV SOSY
PREFILLED_SYRINGE | INTRAVENOUS | Status: AC
  Filled 2021-02-27: qty 10

## 2021-02-27 MED ORDER — SODIUM CHLORIDE (PF) 0.9 % IJ SOLN
INTRAMUSCULAR | Status: AC
  Filled 2021-02-27: qty 10

## 2021-02-27 MED ORDER — FENTANYL CITRATE PF 50 MCG/ML IJ SOSY
PREFILLED_SYRINGE | INTRAMUSCULAR | Status: AC
  Administered 2021-02-27: 50 ug via INTRAVENOUS
  Filled 2021-02-27: qty 1

## 2021-02-27 MED ORDER — METOPROLOL TARTRATE 5 MG/5ML IV SOLN
5.0000 mg | Freq: Four times a day (QID) | INTRAVENOUS | Status: DC | PRN
Start: 2021-02-27 — End: 2021-03-01

## 2021-02-27 MED ORDER — CHLORHEXIDINE GLUCONATE CLOTH 2 % EX PADS: 6.0000 | MEDICATED_PAD | Freq: Once | CUTANEOUS | Status: DC

## 2021-02-27 MED ORDER — KETAMINE HCL 10 MG/ML IJ SOLN
INTRAMUSCULAR | Status: AC
  Filled 2021-02-27: qty 1

## 2021-02-27 MED ORDER — PHENYLEPHRINE 40 MCG/ML (10ML) SYRINGE FOR IV PUSH (FOR BLOOD PRESSURE SUPPORT)
PREFILLED_SYRINGE | INTRAVENOUS | Status: DC | PRN
  Administered 2021-02-27: 120 ug via INTRAVENOUS

## 2021-02-27 MED ORDER — DEXAMETHASONE SODIUM PHOSPHATE 10 MG/ML IJ SOLN
INTRAMUSCULAR | Status: DC | PRN
  Administered 2021-02-27: 10 mg via INTRAVENOUS

## 2021-02-27 MED ORDER — FENTANYL CITRATE (PF) 250 MCG/5ML IJ SOLN
INTRAMUSCULAR | Status: AC
  Filled 2021-02-27: qty 5

## 2021-02-27 MED ORDER — CHLORHEXIDINE GLUCONATE 0.12 % MT SOLN
15.0000 mL | Freq: Once | OROMUCOSAL | Status: AC
  Administered 2021-02-27: 15 mL via OROMUCOSAL

## 2021-02-27 MED ORDER — PANTOPRAZOLE SODIUM 40 MG IV SOLR
40.0000 mg | Freq: Every day | INTRAVENOUS | Status: DC
  Administered 2021-02-27 – 2021-02-28 (×2): 40 mg via INTRAVENOUS
  Filled 2021-02-27 (×2): qty 40

## 2021-02-27 MED ORDER — LIDOCAINE 2% (20 MG/ML) 5 ML SYRINGE
INTRAMUSCULAR | Status: DC | PRN
  Administered 2021-02-27: 100 mg via INTRAVENOUS

## 2021-02-27 MED ORDER — KETAMINE HCL 10 MG/ML IJ SOLN
INTRAMUSCULAR | Status: DC | PRN
Start: 2021-02-27 — End: 2021-02-27
  Administered 2021-02-27: 60 mg via INTRAVENOUS

## 2021-02-27 MED ORDER — PHENYLEPHRINE 40 MCG/ML (10ML) SYRINGE FOR IV PUSH (FOR BLOOD PRESSURE SUPPORT)
PREFILLED_SYRINGE | INTRAVENOUS | Status: AC
  Filled 2021-02-27: qty 10

## 2021-02-27 MED ORDER — MIDAZOLAM HCL 2 MG/2ML IJ SOLN
INTRAMUSCULAR | Status: DC | PRN
  Administered 2021-02-27: 2 mg via INTRAVENOUS

## 2021-02-27 MED ORDER — SCOPOLAMINE 1 MG/3DAYS TD PT72
1.0000 | MEDICATED_PATCH | TRANSDERMAL | Status: DC
  Administered 2021-02-27: 1.5 mg via TRANSDERMAL
  Filled 2021-02-27: qty 1

## 2021-02-27 MED ORDER — HYDROCODONE-ACETAMINOPHEN 5-325 MG PO TABS
1.0000 | ORAL_TABLET | ORAL | Status: DC | PRN
  Administered 2021-02-27 – 2021-02-28 (×6): 2 via ORAL
  Administered 2021-03-01: 1 via ORAL
  Administered 2021-03-01 (×2): 2 via ORAL
  Filled 2021-02-27 (×3): qty 2
  Filled 2021-02-27: qty 1
  Filled 2021-02-27 (×5): qty 2

## 2021-02-27 MED ORDER — ACETAMINOPHEN 500 MG PO TABS
1000.0000 mg | ORAL_TABLET | ORAL | Status: AC
  Administered 2021-02-27: 1000 mg via ORAL
  Filled 2021-02-27: qty 2

## 2021-02-27 MED ORDER — 0.9 % SODIUM CHLORIDE (POUR BTL) OPTIME
TOPICAL | Status: DC | PRN
  Administered 2021-02-27: 1000 mL

## 2021-02-27 MED ORDER — ROCURONIUM BROMIDE 10 MG/ML (PF) SYRINGE
PREFILLED_SYRINGE | INTRAVENOUS | Status: AC
  Filled 2021-02-27: qty 10

## 2021-02-27 MED ORDER — MONTELUKAST SODIUM 10 MG PO TABS
10.0000 mg | ORAL_TABLET | Freq: Every day | ORAL | Status: DC
  Administered 2021-02-27 – 2021-02-28 (×2): 10 mg via ORAL
  Filled 2021-02-27 (×2): qty 1

## 2021-02-27 MED ORDER — ROCURONIUM BROMIDE 10 MG/ML (PF) SYRINGE
PREFILLED_SYRINGE | INTRAVENOUS | Status: DC | PRN
  Administered 2021-02-27: 20 mg via INTRAVENOUS
  Administered 2021-02-27: 60 mg via INTRAVENOUS
  Administered 2021-02-27: 20 mg via INTRAVENOUS
  Administered 2021-02-27: 10 mg via INTRAVENOUS

## 2021-02-27 MED ORDER — MORPHINE SULFATE (PF) 4 MG/ML IV SOLN
1.0000 mg | INTRAVENOUS | Status: DC | PRN
  Administered 2021-02-27: 1 mg via INTRAVENOUS
  Filled 2021-02-27: qty 1

## 2021-02-27 MED ORDER — FENTANYL CITRATE (PF) 250 MCG/5ML IJ SOLN
INTRAMUSCULAR | Status: DC | PRN
  Administered 2021-02-27: 100 ug via INTRAVENOUS
  Administered 2021-02-27 (×3): 50 ug via INTRAVENOUS

## 2021-02-27 MED ORDER — LACTATED RINGERS IV SOLN
INTRAVENOUS | Status: DC
Start: 2021-02-27 — End: 2021-02-27

## 2021-02-27 MED ORDER — FENTANYL CITRATE PF 50 MCG/ML IJ SOSY: 25.0000 ug | PREFILLED_SYRINGE | INTRAMUSCULAR | Status: DC | PRN

## 2021-02-27 MED ORDER — ENSURE PRE-SURGERY PO LIQD
296.0000 mL | Freq: Once | ORAL | Status: DC
  Filled 2021-02-27: qty 296

## 2021-02-27 MED ORDER — SODIUM CHLORIDE 0.9 % IV SOLN
2.0000 g | INTRAVENOUS | Status: AC
  Administered 2021-02-27: 2 g via INTRAVENOUS
  Filled 2021-02-27: qty 2

## 2021-02-27 MED ORDER — LACTATED RINGERS IR SOLN
Status: DC | PRN
  Administered 2021-02-27: 1000 mL

## 2021-02-27 MED ORDER — ORAL CARE MOUTH RINSE: 15.0000 mL | Freq: Once | OROMUCOSAL | Status: AC

## 2021-02-27 MED ORDER — SUGAMMADEX SODIUM 500 MG/5ML IV SOLN
INTRAVENOUS | Status: DC | PRN
  Administered 2021-02-27: 280 mg via INTRAVENOUS

## 2021-02-27 MED ORDER — PROPOFOL 10 MG/ML IV BOLUS
INTRAVENOUS | Status: AC
  Filled 2021-02-27: qty 20

## 2021-02-27 SURGICAL SUPPLY — 49 items
ADH SKN CLS APL DERMABOND .7 (GAUZE/BANDAGES/DRESSINGS) ×1
APL PRP STRL LF DISP 70% ISPRP (MISCELLANEOUS) ×1
BAG COUNTER SPONGE SURGICOUNT (BAG) IMPLANT
BAG SPNG CNTER NS LX DISP (BAG)
BINDER ABDOMINAL 12 ML 46-62 (SOFTGOODS) IMPLANT
CABLE HIGH FREQUENCY MONO STRZ (ELECTRODE) IMPLANT
CHLORAPREP W/TINT 26 (MISCELLANEOUS) ×2 IMPLANT
COVER SURGICAL LIGHT HANDLE (MISCELLANEOUS) ×2 IMPLANT
DECANTER SPIKE VIAL GLASS SM (MISCELLANEOUS) ×1 IMPLANT
DERMABOND ADVANCED (GAUZE/BANDAGES/DRESSINGS) ×1
DERMABOND ADVANCED .7 DNX12 (GAUZE/BANDAGES/DRESSINGS) ×1 IMPLANT
DEVICE SECURE STRAP 25 ABSORB (INSTRUMENTS) ×1 IMPLANT
DEVICE TROCAR PUNCTURE CLOSURE (ENDOMECHANICALS) ×2 IMPLANT
DISSECTOR BLUNT TIP ENDO 5MM (MISCELLANEOUS) IMPLANT
ELECT L-HOOK LAP 45CM DISP (ELECTROSURGICAL)
ELECT PENCIL ROCKER SW 15FT (MISCELLANEOUS) ×2 IMPLANT
ELECT REM PT RETURN 15FT ADLT (MISCELLANEOUS) ×2 IMPLANT
ELECTRODE L-HOOK LAP 45CM DISP (ELECTROSURGICAL) IMPLANT
GLOVE SURG ENC TEXT LTX SZ8 (GLOVE) ×2 IMPLANT
GOWN STRL REUS W/TWL XL LVL3 (GOWN DISPOSABLE) ×6 IMPLANT
KIT BASIN OR (CUSTOM PROCEDURE TRAY) ×2 IMPLANT
KIT TURNOVER KIT A (KITS) ×2 IMPLANT
MARKER SKIN DUAL TIP RULER LAB (MISCELLANEOUS) ×2 IMPLANT
MESH VENTRALIGHT ST 6X8 (Mesh Specialty) ×2 IMPLANT
MESH VENTRLGHT ELLIPSE 8X6XMFL (Mesh Specialty) IMPLANT
NDL SPNL 22GX3.5 QUINCKE BK (NEEDLE) ×1 IMPLANT
NEEDLE SPNL 22GX3.5 QUINCKE BK (NEEDLE) ×2 IMPLANT
SCISSORS LAP 5X45 EPIX DISP (ENDOMECHANICALS) ×2 IMPLANT
SCRUB TECHNI CARE 4 OZ NO DYE (MISCELLANEOUS) IMPLANT
SET IRRIG TUBING LAPAROSCOPIC (IRRIGATION / IRRIGATOR) ×1 IMPLANT
SET TUBE SMOKE EVAC HIGH FLOW (TUBING) ×2 IMPLANT
SHEARS HARMONIC ACE PLUS 45CM (MISCELLANEOUS) IMPLANT
SLEEVE ADV FIXATION 5X100MM (TROCAR) ×1 IMPLANT
SLEEVE XCEL OPT CAN 5 100 (ENDOMECHANICALS) IMPLANT
STAPLER VISISTAT 35W (STAPLE) IMPLANT
SUT MNCRL AB 4-0 PS2 18 (SUTURE) ×2 IMPLANT
SUT NOVA 1 T20/GS 25DT (SUTURE) ×2 IMPLANT
SUT NOVA NAB DX-16 0-1 5-0 T12 (SUTURE) IMPLANT
SYR 10ML ECCENTRIC (SYRINGE) IMPLANT
TACKER 5MM HERNIA 3.5CML NAB (ENDOMECHANICALS) IMPLANT
TOWEL OR 17X26 10 PK STRL BLUE (TOWEL DISPOSABLE) ×2 IMPLANT
TOWEL OR NON WOVEN STRL DISP B (DISPOSABLE) ×2 IMPLANT
TRAY FOLEY MTR SLVR 16FR STAT (SET/KITS/TRAYS/PACK) IMPLANT
TRAY LAPAROSCOPIC (CUSTOM PROCEDURE TRAY) ×2 IMPLANT
TROCAR ADV FIXATION 11X100MM (TROCAR) IMPLANT
TROCAR ADV FIXATION 5X100MM (TROCAR) ×2 IMPLANT
TROCAR BLADELESS OPT 5 100 (ENDOMECHANICALS) ×2 IMPLANT
TROCAR XCEL NON-BLD 11X100MML (ENDOMECHANICALS) IMPLANT
YANKAUER SUCT BULB TIP NO VENT (SUCTIONS) ×1 IMPLANT

## 2021-02-27 NOTE — Op Note (Signed)
Amanda Davidson  1970/12/01   02/27/2021    PCP:  Jomarie Longs, PA-C   Surgeon: Wenda Low, MD, FACS  Asst:  Phylliss Blakes, MD, FACS  Anes:  general  Preop Dx: Recurrent ventral hernia with incarceration Postop Dx: Two herniae with omentum and colon stuck in hernia  Procedure: Laparoscopic assisted takedown of incarceration with Ventralite (6 x 8 in) mesh patch  Location Surgery: [facility] OR 2 Complications:  None noted  EBL:   25 cc  Drains: none  Description of Procedure:  The patient was taken to OR 2 .  After anesthesia was administered and the patient was prepped  with chloroprep  and a timeout was performed.  Access to the abdomen was achieved with a 5 mm Optiview through the left upper quadrant.  2 other trochars were placed in the left lower quadrant and the right side and the right side.  Incarcerated omentum was noted in the hernias and this was reduced manually.  We look for: In the colon I think was tented up with a lower portion of the omentum however it was not that we could see in the hernia and the in the incarcerated portion was mainly omental fat.  This was reduced manually and with some scissor dissection.  Once reduced we measured it and I went ahead and cut down on it and stripped off the sac of the larger one which was in the upper portion of the midline about an inch above the umbilicus.  This was 2 fingerbreadths in diameter.  There was a second hole it was down adjacent the umbilicus.  It was smaller.  Between the 2 was old mesh that been placed there but was fairly densely adherent so it was left in situ.  After cutting down and stripping out the sac we introduced a piece of 6 x 8 inch ventral light with 4 sutures on the side that would to be anterior.  The but the skin was marked on the outside and the superiormost wound was threaded and with an Endo Close needle before actually putting the rest into the abdomen.  We then deployed the mesh and then the whole  in the upper 1 was closed with 4 sutures of #1 Novafil with a figure-of-eight fashion.  The colon meanwhile was inspected and looked to be intact and unblemished.  The other 3 stay sutures were then retrieved with the Endo close device and secured and then the mesh was tacked with a 6 secure strap.  It was seem to be lying nicely at the end of the case.  I irrigated with saline.  No bleeding was noted from the omentum.  The colon looked good and there are no other issues noted.  Wounds were injected with Exparel.  Wounds were closed with interrupted 4-0 Vicryl pop-off's.  The 4 holes for the secured sutures were closed with 4-0 Monocryl.  Dermabond was used on all the incisions.  Abdominal binder was applied.  The patient tolerated the procedure well and was taken to the PACU in stable condition.     Matt B. Daphine Deutscher, MD, [facility], [place] [phone]

## 2021-02-27 NOTE — Anesthesia Procedure Notes (Signed)
Procedure Name: Intubation Date/Time: 02/27/2021 3:12 PM Performed by: Florene Route, CRNA Pre-anesthesia Checklist: Patient identified, Emergency Drugs available, Suction available and Patient being monitored Patient Re-evaluated:Patient Re-evaluated prior to induction Oxygen Delivery Method: Circle system utilized Preoxygenation: Pre-oxygenation with 100% oxygen Induction Type: IV induction Ventilation: Mask ventilation without difficulty Laryngoscope Size: Miller and 2 Grade View: Grade I Tube type: Oral Tube size: 7.5 mm Number of attempts: 1 Airway Equipment and Method: Stylet and Oral airway Placement Confirmation: ETT inserted through vocal cords under direct vision, positive ETCO2 and breath sounds checked- equal and bilateral Secured at: 22 cm Tube secured with: Tape Dental Injury: Teeth and Oropharynx as per pre-operative assessment

## 2021-02-27 NOTE — Transfer of Care (Signed)
Immediate Anesthesia Transfer of Care Note  Patient: Amanda Davidson  Procedure(s) Performed: LAPAROSCOPIC ASSISTED VENTRAL HERNIA REPAIR WITH MESH (Abdomen)  Patient Location: PACU  Anesthesia Type:General  Level of Consciousness: awake, alert  and patient cooperative  Airway & Oxygen Therapy: Patient Spontanous Breathing and Patient connected to face mask oxygen  Post-op Assessment: Report given to RN and Post -op Vital signs reviewed and stable  Post vital signs: Reviewed and stable  Last Vitals:  Vitals Value Taken Time  BP 152/102 02/27/21 1715  Temp    Pulse 80 02/27/21 1716  Resp 15 02/27/21 1716  SpO2 100 % 02/27/21 1716  Vitals shown include unvalidated device data.  Last Pain: There were no vitals filed for this visit.       Complications: No notable events documented.

## 2021-02-27 NOTE — Anesthesia Preprocedure Evaluation (Signed)
Anesthesia Evaluation  Patient identified by MRN, date of birth, ID band Patient awake    Reviewed: Allergy & Precautions, NPO status   History of Anesthesia Complications (+) PONV  Airway Mallampati: II  TM Distance: >3 FB     Dental   Pulmonary shortness of breath and with exertion, asthma , sleep apnea , pneumonia,    breath sounds clear to auscultation       Cardiovascular hypertension,  Rhythm:Regular Rate:Normal     Neuro/Psych  Headaches, Anxiety Depression    GI/Hepatic Neg liver ROS, GERD  ,  Endo/Other  diabetesHypothyroidism   Renal/GU negative Renal ROS     Musculoskeletal   Abdominal   Peds  Hematology   Anesthesia Other Findings   Reproductive/Obstetrics                             Anesthesia Physical Anesthesia Plan  ASA: 3  Anesthesia Plan: General   Post-op Pain Management:    Induction: Intravenous  PONV Risk Score and Plan: 4 or greater and Ondansetron, Dexamethasone and Midazolam  Airway Management Planned: Oral ETT  Additional Equipment:   Intra-op Plan:   Post-operative Plan:   Informed Consent: I have reviewed the patients History and Physical, chart, labs and discussed the procedure including the risks, benefits and alternatives for the proposed anesthesia with the patient or authorized representative who has indicated his/her understanding and acceptance.     Dental advisory given  Plan Discussed with: CRNA and Anesthesiologist  Anesthesia Plan Comments:         Anesthesia Quick Evaluation

## 2021-02-27 NOTE — H&P (Signed)
Chief Complaint:  recurrent supraumbilical hernia  History of Present Illness:  Amanda Davidson is an 50 y.o. female who was seen in the office 6/16 regarding a ventral hernia that has recurred following repair in 2015 or a then recurrent ventral hernia.  CT scan obtained that showed this hernia to contain nonobstructing transverse colon.    Past Medical History:  Diagnosis Date   Anemia    Asthma    Complication of anesthesia    NAUSEA   Depression    Diabetes mellitus without complication (HCC)    Drug use    Exercise-induced asthma    GERD (gastroesophageal reflux disease)    Headache(784.0)    MIGRAINES   Hypertension    Hypothyroidism    IBS (irritable bowel syndrome)    Migraines    Obesity    Pneumonia    PONV (postoperative nausea and vomiting)    Post-nasal drip    Shortness of breath    Sleep apnea    C-PAP but doesnt use it regularly    Vitamin D deficiency     Past Surgical History:  Procedure Laterality Date   BIOPSY  05/02/2020   Procedure: BIOPSY;  Surgeon: Shellia Cleverly, DO;  Location: [facility];  Service: Gastroenterology;;   COLONOSCOPY WITH PROPOFOL N/A 05/02/2020   Procedure: COLONOSCOPY WITH PROPOFOL;  Surgeon: Shellia Cleverly, DO;  Location: [facility] END[facility];  Service: Gastroenterology;  Laterality: N/A;   HERNIA REPAIR     Umbilical around 2002 and then repair 2015   LAPAROSCOPIC GASTRIC SLEEVE RESECTION N/A 05/14/2013   Procedure: LAPAROSCOPIC GASTRIC SLEEVE RESECTION;  Surgeon: Valarie Merino, MD;  Location: [facility] ORS;  Service: General;  Laterality: N/A;   POLYPECTOMY  05/02/2020   Procedure: POLYPECTOMY;  Surgeon: Shellia Cleverly, DO;  Location: [facility] ENDOSCOPY;  Service: Gastroenterology;;   TONSILLECTOMY     UMBILICAL HERNIA REPAIR  05/14/2013   Procedure: HERNIA REPAIR UMBILICAL ADULT;  Surgeon: Valarie Merino, MD;  Location: [facility] ORS;  Service: General;;   UPPER GI ENDOSCOPY  05/14/2013   Procedure: UPPER GI ENDOSCOPY;  Surgeon:  Valarie Merino, MD;  Location: [facility] ORS;  Service: General;;    No current facility-administered medications for this encounter.   Current Outpatient Medications  Medication Sig Dispense Refill   albuterol (VENTOLIN HFA) 108 (90 Base) MCG/ACT inhaler Inhale 2 puffs into the lungs every 6 (six) hours as needed. (Patient taking differently: Inhale 2 puffs into the lungs 2 (two) times daily. Asthma) 6.7 g 1   levothyroxine (SYNTHROID) 100 MCG tablet Take 1 tablet (100 mcg total) by mouth daily. Visit for refills 90 tablet 0   montelukast (SINGULAIR) 10 MG tablet Take one tablet daily. (Patient taking differently: Take 10 mg by mouth at bedtime.) 90 tablet 3   ondansetron (ZOFRAN) 8 MG tablet Take 1 tablet (8 mg total) by mouth every 8 (eight) hours as needed for nausea or vomiting. 20 tablet 0   Semaglutide-Weight Management (WEGOVY) 2.4 MG/0.75ML SOAJ Inject 2.4 mg into the skin once a week. 3 mL 2   SUMAtriptan (IMITREX) 100 MG tablet Take 1 tablet (100 mg total) by mouth every 2 (two) hours as needed for migraine (not to exceed 2/24 hrs). Needs appt for refills 9 tablet 0   Facility-Administered Medications Ordered in Other Encounters  Medication Dose Route Frequency Provider Last Rate Last Admin   polyethylene glycol powder (GLYCOLAX/MIRALAX) container 255 g  1 Container Oral Once Luretha Murphy, MD  Patient has no known allergies. Family History  Problem Relation Age of Onset   Hypertension Mother    Heart disease Mother    Depression Mother    Hypertension Father    Diabetes Father    Sleep apnea Father    Heart disease Maternal Grandfather    Heart attack Maternal Grandfather    Cancer Maternal Grandfather        bladder   Hyperlipidemia Other    Sleep apnea Other    Esophageal cancer Paternal Grandfather    Colon cancer Maternal Great-grandmother    Social History:   reports that she has never smoked. She has never used smokeless tobacco. She reports current alcohol use  of about 1.0 standard drink per week. She reports that she does not use drugs.   REVIEW OF SYSTEMS : Negative except for see problem list  Physical Exam:   There were no vitals taken for this visit. There is no height or weight on file to calculate BMI.  Gen:  WDWN WF NAD  Neurological: Alert and oriented to person, place, and time. Motor and sensory function is grossly intact  Head: Normocephalic and atraumatic.  Eyes: Conjunctivae are normal. Pupils are equal, round, and reactive to light. No scleral icterus.  Neck: Normal range of motion. Neck supple. No tracheal deviation or thyromegaly present.  Cardiovascular:  SR without murmurs or gallops.  No carotid bruits Breast:  not examined Respiratory: Effort normal.  No respiratory distress. No chest wall tenderness. Breath sounds normal.  No wheezes, rales or rhonchi.  Abdomen:  hernia site marked and not reducible (incarcerated) GU:  not examined Musculoskeletal: Normal range of motion. Extremities are nontender. No cyanosis, edema or clubbing noted Lymphadenopathy: No cervical, preauricular, postauricular or axillary adenopathy is present Skin: Skin is warm and dry. No rash noted. No diaphoresis. No erythema. No pallor. Pscyh: Normal mood and affect. Behavior is normal. Judgment and thought content normal.   LABORATORY RESULTS: No results found for this or any previous visit (from the past 48 hour(s)).   RADIOLOGY RESULTS: No results found.  Problem List: Patient Active Problem List   Diagnosis Date Noted   Change in stool    Colon cancer screening    Polyp of sigmoid colon    Grade II internal hemorrhoids    Snoring 04/21/2018   Tonsillar mass 04/20/2018   Tonsillar enlargement 04/20/2018   Pre-diabetes 11/19/2017   Trigger finger, left ring finger 10/23/2017   Adjustment disorder with mixed anxiety and depressed mood 10/23/2017   Type 2 diabetes mellitus without complication, without long-term current use of insulin  (HCC) 05/19/2017   Insulin resistance 05/24/2013   Laparoscopic sleeve gastrectomy Dec 2014 05/14/2013   Asthma, moderate persistent 09/18/2012   Class 3 severe obesity due to excess calories with serious comorbidity and body mass index (BMI) of 45.0 to 49.9 in adult (HCC) 09/18/2012   Hypertension, essential, benign 06/30/2012   OSA (obstructive sleep apnea) 05/24/2012   Hypothyroidism 05/24/2012   Migraine 05/24/2012   Vitamin D deficiency 05/24/2012   Asthma 05/24/2012   Depression with anxiety 05/24/2012    Assessment & Plan: Incarcerated ventral hernia for lap assisted repair    Matt B. Daphine Deutscher, MD, [facility] [phone] beeper [phone]  02/27/2021 6:08 AM

## 2021-02-27 NOTE — Anesthesia Postprocedure Evaluation (Signed)
Anesthesia Post Note  Patient: Audiological scientist  Procedure(s) Performed: LAPAROSCOPIC ASSISTED VENTRAL HERNIA REPAIR WITH MESH (Abdomen)     Patient location during evaluation: PACU Anesthesia Type: General Level of consciousness: awake Pain management: pain level controlled Vital Signs Assessment: post-procedure vital signs reviewed and stable Respiratory status: spontaneous breathing Cardiovascular status: stable Postop Assessment: no apparent nausea or vomiting Anesthetic complications: no   No notable events documented.  Last Vitals: There were no vitals filed for this visit.  Last Pain: There were no vitals filed for this visit.               Yadhira Mckneely

## 2021-02-28 ENCOUNTER — Encounter (HOSPITAL_COMMUNITY): Payer: Self-pay | Admitting: Surgery

## 2021-02-28 DIAGNOSIS — K436 Other and unspecified ventral hernia with obstruction, without gangrene: Secondary | ICD-10-CM | POA: Diagnosis not present

## 2021-02-28 DIAGNOSIS — J45909 Unspecified asthma, uncomplicated: Secondary | ICD-10-CM | POA: Diagnosis not present

## 2021-02-28 DIAGNOSIS — E119 Type 2 diabetes mellitus without complications: Secondary | ICD-10-CM | POA: Diagnosis not present

## 2021-02-28 DIAGNOSIS — Z79899 Other long term (current) drug therapy: Secondary | ICD-10-CM | POA: Diagnosis not present

## 2021-02-28 DIAGNOSIS — E039 Hypothyroidism, unspecified: Secondary | ICD-10-CM | POA: Diagnosis not present

## 2021-02-28 DIAGNOSIS — I10 Essential (primary) hypertension: Secondary | ICD-10-CM | POA: Diagnosis not present

## 2021-02-28 LAB — BASIC METABOLIC PANEL
Anion gap: 7 (ref 5–15)
BUN: 11 mg/dL (ref 6–20)
CO2: 25 mmol/L (ref 22–32)
Calcium: 8.8 mg/dL — ABNORMAL LOW (ref 8.9–10.3)
Chloride: 102 mmol/L (ref 98–111)
Creatinine, Ser: 0.79 mg/dL (ref 0.44–1.00)
GFR, Estimated: >60 mL/min (ref >60)
Glucose, Bld: 183 mg/dL — ABNORMAL HIGH (ref 70–99)
Potassium: 5.1 mmol/L (ref 3.5–5.1)
Sodium: 134 mmol/L — ABNORMAL LOW (ref 135–145)

## 2021-02-28 LAB — GLUCOSE, CAPILLARY
Glucose-Capillary: 106 mg/dL — ABNORMAL HIGH (ref 70–99)
Glucose-Capillary: 121 mg/dL — ABNORMAL HIGH (ref 70–99)
Glucose-Capillary: 130 mg/dL — ABNORMAL HIGH (ref 70–99)
Glucose-Capillary: 137 mg/dL — ABNORMAL HIGH (ref 70–99)
Glucose-Capillary: 143 mg/dL — ABNORMAL HIGH (ref 70–99)

## 2021-02-28 LAB — CBC
HCT: 37.2 % (ref 36.0–46.0)
Hemoglobin: 12.3 g/dL (ref 12.0–15.0)
MCH: 29.5 pg (ref 26.0–34.0)
MCHC: 33.1 g/dL (ref 30.0–36.0)
MCV: 89.2 fL (ref 80.0–100.0)
Platelets: 297 10*3/uL (ref 150–400)
RBC: 4.17 MIL/uL (ref 3.87–5.11)
RDW: 14 % (ref 11.5–15.5)
WBC: 12.2 10*3/uL — ABNORMAL HIGH (ref 4.0–10.5)
nRBC: 0 % (ref 0.0–0.2)

## 2021-02-28 MED ORDER — DEXTROSE-NACL 5-0.45 % IV SOLN: INTRAVENOUS | Status: DC

## 2021-02-28 MED ORDER — INSULIN ASPART 100 UNIT/ML IJ SOLN
0.0000 [IU] | INTRAMUSCULAR | Status: DC
  Administered 2021-02-28 (×4): 3 [IU] via SUBCUTANEOUS

## 2021-02-28 NOTE — Progress Notes (Signed)
Patient ID: Amanda Davidson, female   DOB: 1971-02-03, 50 y.o.   MRN: [id number] [facility] Progress Note:   1 Day Post-Op  Subjective: Mental status is alert;  pain better but still an issure.  Complaints postop pain. Objective: Vital signs in last 24 hours: Temp:  [97.5 F (36.4 C)-98.3 F (36.8 C)] 97.8 F (36.6 C) (09/28 0524) Pulse Rate:  [65-79] 73 (09/28 0524) Resp:  [11-18] 18 (09/28 0524) BP: (124-161)/(75-102) 134/90 (09/28 0524) SpO2:  [91 %-98 %] 98 % (09/28 0524) Weight:  [782 kg] 136 kg (09/27 1824)  Intake/Output from previous day: 09/27 0701 - 09/28 0700 In: 3949.5 [P.O.:1200; I.V.:2549.5; IV Piggyback:200] Out: 1610 [Urine:1600; Blood:10] Intake/Output this shift: No intake/output data recorded.  Physical Exam: Work of breathing is normal at rest.  Abdominal binder in place-wounds with dermabond  Lab Results:  Results for orders placed or performed during the hospital encounter of 02/27/21 (from the past 48 hour(s))  Pregnancy, urine     Status: None   Collection Time: 02/27/21  1:33 PM  Result Value Ref Range   Preg Test, Ur NEGATIVE NEGATIVE    Comment:        THE SENSITIVITY OF THIS METHODOLOGY IS >20 mIU/mL. Performed at [facility], [address] [place], [place] [address]   Glucose, capillary     Status: Abnormal   Collection Time: 02/27/21  5:24 PM  Result Value Ref Range   Glucose-Capillary 153 (H) 70 - 99 mg/dL    Comment: Glucose reference range applies only to samples taken after fasting for at least 8 hours.  CBC     Status: Abnormal   Collection Time: 02/27/21  6:35 PM  Result Value Ref Range   WBC 16.5 (H) 4.0 - 10.5 K/uL   RBC 4.44 3.87 - 5.11 MIL/uL   Hemoglobin 13.1 12.0 - 15.0 g/dL   HCT 30.8 65.7 - 84.6 %   MCV 90.1 80.0 - 100.0 fL   MCH 29.5 26.0 - 34.0 pg   MCHC 32.8 30.0 - 36.0 g/dL   RDW 96.2 95.2 - 84.1 %   Platelets 294 150 - 400 K/uL   nRBC 0.0 0.0 - 0.2 %    Comment: Performed at  [facility], [address] [place], [place] [address]  Creatinine, serum     Status: None   Collection Time: 02/27/21  6:35 PM  Result Value Ref Range   Creatinine, Ser 0.79 0.44 - 1.00 mg/dL   GFR, Estimated >10 >27 mL/min    Comment: (NOTE) Calculated using the CKD-EPI Creatinine Equation (2021) Performed at [facility], [address] [place], [place] [address]   CBC     Status: Abnormal   Collection Time: 02/28/21  4:48 AM  Result Value Ref Range   WBC 12.2 (H) 4.0 - 10.5 K/uL   RBC 4.17 3.87 - 5.11 MIL/uL   Hemoglobin 12.3 12.0 - 15.0 g/dL   HCT 44.0 34.7 - 42.5 %   MCV 89.2 80.0 - 100.0 fL   MCH 29.5 26.0 - 34.0 pg   MCHC 33.1 30.0 - 36.0 g/dL   RDW 95.6 38.7 - 56.4 %   Platelets 297 150 - 400 K/uL   nRBC 0.0 0.0 - 0.2 %    Comment: Performed at [facility], [address] [place], [place] [address]  Basic metabolic panel     Status: Abnormal   Collection Time: 02/28/21  4:48 AM  Result  Value Ref Range   Sodium 134 (L) 135 - 145 mmol/L   Potassium 5.1 3.5 - 5.1 mmol/L   Chloride 102 98 - 111 mmol/L   CO2 25 22 - 32 mmol/L   Glucose, Bld 183 (H) 70 - 99 mg/dL    Comment: Glucose reference range applies only to samples taken after fasting for at least 8 hours.   BUN 11 6 - 20 mg/dL   Creatinine, Ser 2.99 0.44 - 1.00 mg/dL   Calcium 8.8 (L) 8.9 - 10.3 mg/dL   GFR, Estimated >24 >26 mL/min    Comment: (NOTE) Calculated using the CKD-EPI Creatinine Equation (2021)    Anion gap 7 5 - 15    Comment: Performed at [facility], [address] [place], [place] [address]    Radiology/Results: No results found.  Anti-infectives: Anti-infectives (From admission, onward)    Start     Dose/Rate Route Frequency Ordered Stop   02/28/21 0300  cefoTEtan (CEFOTAN) 2 g in sodium chloride 0.9 % 100 mL IVPB        2 g 200 mL/hr over 30 Minutes Intravenous Every 12 hours 02/27/21 1736 02/28/21 0302    02/27/21 1315  cefoTEtan (CEFOTAN) 2 g in sodium chloride 0.9 % 100 mL IVPB        2 g 200 mL/hr over 30 Minutes Intravenous On call to O.R. 02/27/21 1259 02/27/21 1529       Assessment/Plan: Problem List: Patient Active Problem List   Diagnosis Date Noted   S/P repair of recurrent ventral hernia 02/27/2021   Change in stool    Colon cancer screening    Polyp of sigmoid colon    Grade II internal hemorrhoids    Snoring 04/21/2018   Tonsillar mass 04/20/2018   Tonsillar enlargement 04/20/2018   Pre-diabetes 11/19/2017   Trigger finger, left ring finger 10/23/2017   Adjustment disorder with mixed anxiety and depressed mood 10/23/2017   Type 2 diabetes mellitus without complication, without long-term current use of insulin (HCC) 05/19/2017   Insulin resistance 05/24/2013   Laparoscopic sleeve gastrectomy Dec 2014 05/14/2013   Asthma, moderate persistent 09/18/2012   Class 3 severe obesity due to excess calories with serious comorbidity and body mass index (BMI) of 45.0 to 49.9 in adult (HCC) 09/18/2012   Hypertension, essential, benign 06/30/2012   OSA (obstructive sleep apnea) 05/24/2012   Hypothyroidism 05/24/2012   Migraine 05/24/2012   Vitamin D deficiency 05/24/2012   Asthma 05/24/2012   Depression with anxiety 05/24/2012    WBC down but still elevated.  Will recheck tomorrow.  Concerned because of incarcerated transverse colon and potential compromise that didn't manifest yesterday.  She has limited mobility (using a walker).  Not ready for discharge today.   1 Day Post-Op    LOS: 0 days   Matt B. Daphine Deutscher, MD, [facility] [phone] to reach the surgeon on call.    02/28/2021 9:05 AM

## 2021-03-01 DIAGNOSIS — E039 Hypothyroidism, unspecified: Secondary | ICD-10-CM | POA: Diagnosis not present

## 2021-03-01 DIAGNOSIS — E119 Type 2 diabetes mellitus without complications: Secondary | ICD-10-CM | POA: Diagnosis not present

## 2021-03-01 DIAGNOSIS — J45909 Unspecified asthma, uncomplicated: Secondary | ICD-10-CM | POA: Diagnosis not present

## 2021-03-01 DIAGNOSIS — K436 Other and unspecified ventral hernia with obstruction, without gangrene: Secondary | ICD-10-CM | POA: Diagnosis not present

## 2021-03-01 DIAGNOSIS — Z79899 Other long term (current) drug therapy: Secondary | ICD-10-CM | POA: Diagnosis not present

## 2021-03-01 DIAGNOSIS — I10 Essential (primary) hypertension: Secondary | ICD-10-CM | POA: Diagnosis not present

## 2021-03-01 LAB — BASIC METABOLIC PANEL
Anion gap: 7 (ref 5–15)
BUN: 10 mg/dL (ref 6–20)
CO2: 26 mmol/L (ref 22–32)
Calcium: 8.8 mg/dL — ABNORMAL LOW (ref 8.9–10.3)
Chloride: 102 mmol/L (ref 98–111)
Creatinine, Ser: 0.77 mg/dL (ref 0.44–1.00)
GFR, Estimated: >60 mL/min (ref >60)
Glucose, Bld: 115 mg/dL — ABNORMAL HIGH (ref 70–99)
Potassium: 3.9 mmol/L (ref 3.5–5.1)
Sodium: 135 mmol/L (ref 135–145)

## 2021-03-01 LAB — CBC WITH DIFFERENTIAL/PLATELET
Abs Immature Granulocytes: 0.06 10*3/uL (ref 0.00–0.07)
Basophils Absolute: 0.1 10*3/uL (ref 0.0–0.1)
Basophils Relative: 1 %
Eosinophils Absolute: 0.1 10*3/uL (ref 0.0–0.5)
Eosinophils Relative: 1 %
HCT: 36.8 % (ref 36.0–46.0)
Hemoglobin: 11.7 g/dL — ABNORMAL LOW (ref 12.0–15.0)
Immature Granulocytes: 1 %
Lymphocytes Relative: 30 %
Lymphs Abs: 3.7 10*3/uL (ref 0.7–4.0)
MCH: 29.3 pg (ref 26.0–34.0)
MCHC: 31.8 g/dL (ref 30.0–36.0)
MCV: 92.2 fL (ref 80.0–100.0)
Monocytes Absolute: 0.9 10*3/uL (ref 0.1–1.0)
Monocytes Relative: 7 %
Neutro Abs: 7.5 10*3/uL (ref 1.7–7.7)
Neutrophils Relative %: 60 %
Platelets: 296 10*3/uL (ref 150–400)
RBC: 3.99 MIL/uL (ref 3.87–5.11)
RDW: 14.4 % (ref 11.5–15.5)
WBC: 12.2 10*3/uL — ABNORMAL HIGH (ref 4.0–10.5)
nRBC: 0 % (ref 0.0–0.2)

## 2021-03-01 LAB — GLUCOSE, CAPILLARY
Glucose-Capillary: 113 mg/dL — ABNORMAL HIGH (ref 70–99)
Glucose-Capillary: 103 mg/dL — ABNORMAL HIGH (ref 70–99)

## 2021-03-01 LAB — HEMOGLOBIN A1C
Hgb A1c MFr Bld: 6.1 % — ABNORMAL HIGH (ref 4.8–5.6)
Mean Plasma Glucose: 128 mg/dL

## 2021-03-01 MED ORDER — HYDROCODONE-ACETAMINOPHEN 5-325 MG PO TABS: 1.0000 | ORAL_TABLET | ORAL | 0 refills | Status: DC | PRN

## 2021-03-01 MED ORDER — ONDANSETRON 4 MG PO TBDP: 4.0000 mg | ORAL_TABLET | Freq: Four times a day (QID) | ORAL | 0 refills | Status: DC | PRN

## 2021-03-01 NOTE — Discharge Summary (Signed)
Physician Discharge Summary  Patient ID: Amanda Davidson MRN: [id number] DOB/AGE: [birth year] 50 y.o.  PCP: Jomarie Longs, PA-C  Admit date: 02/27/2021 Discharge date: 03/01/2021  Admission Diagnoses:  recurrent ventral hernia-incarcerated  Discharge Diagnoses:  same  Active Problems:   S/P repair of recurrent ventral hernia   Surgery:  lap assisted repair of incarcerated ventral herniae (2) with one sheet of Ventralite mesh  Discharged Condition: improved  Hospital Course:   had surgery late in the day on Monday.  WBC elevated and K low.  Observed.  Liquids begun but advanced slowly.  Not able to discharge on PD 1.  Adequate PO intake enabled discharge on PD 2.    Consults: none  Significant Diagnostic Studies: K of 5.1.  WBC over 16 K    Discharge Exam: Blood pressure 121/78, pulse 69, temperature 97.8 F (36.6 C), temperature source Oral, resp. rate 18, height 5\' 6"  (1.676 m), weight 136 kg, last menstrual period 02/27/2021, SpO2 99 %. Incisions covered with Dermabond and abdominal binder for support  Disposition: Discharge disposition: 01-Home or Self Care       Discharge Instructions     Call MD for:  redness, tenderness, or signs of infection (pain, swelling, redness, odor or green/yellow discharge around incision site)   Complete by: As directed    Call MD for:  temperature >100.4   Complete by: As directed    Diet - low sodium heart healthy   Complete by: As directed    Increase activity slowly   Complete by: As directed       Allergies as of 03/01/2021   No Known Allergies      Medication List     TAKE these medications    albuterol 108 (90 Base) MCG/ACT inhaler Commonly known as: VENTOLIN HFA Inhale 2 puffs into the lungs every 6 (six) hours as needed. What changed:  when to take this additional instructions   HYDROcodone-acetaminophen 5-325 MG tablet Commonly known as: NORCO/VICODIN Take 1-2 tablets by mouth every 4 (four) hours as  needed for moderate pain.   levothyroxine 100 MCG tablet Commonly known as: SYNTHROID Take 1 tablet (100 mcg total) by mouth daily. Visit for refills   montelukast 10 MG tablet Commonly known as: SINGULAIR Take one tablet daily. What changed:  how much to take how to take this when to take this additional instructions   ondansetron 4 MG disintegrating tablet Commonly known as: ZOFRAN-ODT Take 1 tablet (4 mg total) by mouth every 6 (six) hours as needed for nausea.   ondansetron 8 MG tablet Commonly known as: Zofran Take 1 tablet (8 mg total) by mouth every 8 (eight) hours as needed for nausea or vomiting.   SUMAtriptan 100 MG tablet Commonly known as: IMITREX Take 1 tablet (100 mg total) by mouth every 2 (two) hours as needed for migraine (not to exceed 2/24 hrs). Needs appt for refills   Wegovy 2.4 MG/0.75ML Soaj Generic drug: Semaglutide-Weight Management Inject 2.4 mg into the skin once a week.         Signed: 3/24 03/01/2021, 8:30 AM

## 2021-03-27 DIAGNOSIS — Z1231 Encounter for screening mammogram for malignant neoplasm of breast: Secondary | ICD-10-CM | POA: Diagnosis not present

## 2021-03-27 LAB — HM MAMMOGRAPHY

## 2021-03-28 ENCOUNTER — Other Ambulatory Visit: Payer: Self-pay | Admitting: Physician Assistant

## 2021-03-28 DIAGNOSIS — G43009 Migraine without aura, not intractable, without status migrainosus: Secondary | ICD-10-CM

## 2021-03-30 ENCOUNTER — Encounter: Payer: Self-pay | Admitting: Physician Assistant

## 2021-05-09 DIAGNOSIS — D485 Neoplasm of uncertain behavior of skin: Secondary | ICD-10-CM | POA: Diagnosis not present

## 2021-05-09 DIAGNOSIS — L905 Scar conditions and fibrosis of skin: Secondary | ICD-10-CM | POA: Diagnosis not present

## 2021-05-15 ENCOUNTER — Other Ambulatory Visit: Payer: Self-pay | Admitting: Physician Assistant

## 2021-06-29 ENCOUNTER — Ambulatory Visit (INDEPENDENT_AMBULATORY_CARE_PROVIDER_SITE_OTHER): Payer: BC Managed Care – PPO | Admitting: Physician Assistant

## 2021-06-29 ENCOUNTER — Other Ambulatory Visit: Payer: Self-pay | Admitting: Physician Assistant

## 2021-06-29 ENCOUNTER — Other Ambulatory Visit: Payer: Self-pay

## 2021-06-29 ENCOUNTER — Encounter: Payer: Self-pay | Admitting: Physician Assistant

## 2021-06-29 VITALS — BP 143/77 | HR 86 | Ht 66.0 in | Wt 311.3 lb

## 2021-06-29 DIAGNOSIS — Z9889 Other specified postprocedural states: Secondary | ICD-10-CM

## 2021-06-29 DIAGNOSIS — E785 Hyperlipidemia, unspecified: Secondary | ICD-10-CM | POA: Diagnosis not present

## 2021-06-29 DIAGNOSIS — E039 Hypothyroidism, unspecified: Secondary | ICD-10-CM

## 2021-06-29 DIAGNOSIS — Z8719 Personal history of other diseases of the digestive system: Secondary | ICD-10-CM

## 2021-06-29 DIAGNOSIS — E119 Type 2 diabetes mellitus without complications: Secondary | ICD-10-CM

## 2021-06-29 DIAGNOSIS — Z23 Encounter for immunization: Secondary | ICD-10-CM | POA: Diagnosis not present

## 2021-06-29 DIAGNOSIS — I10 Essential (primary) hypertension: Secondary | ICD-10-CM | POA: Diagnosis not present

## 2021-06-29 DIAGNOSIS — Z6841 Body Mass Index (BMI) 40.0 and over, adult: Secondary | ICD-10-CM

## 2021-06-29 LAB — POCT GLYCOSYLATED HEMOGLOBIN (HGB A1C): Hemoglobin A1C: 6.6 % — AB (ref 4.0–5.6)

## 2021-06-29 MED ORDER — TIRZEPATIDE 2.5 MG/0.5ML ~~LOC~~ SOAJ: 2.5000 mg | SUBCUTANEOUS | 0 refills | Status: DC

## 2021-06-29 MED ORDER — TIRZEPATIDE 7.5 MG/0.5ML ~~LOC~~ SOAJ: 7.5000 mg | SUBCUTANEOUS | 0 refills | Status: DC

## 2021-06-29 MED ORDER — LEVOTHYROXINE SODIUM 137 MCG PO TABS: 137.0000 ug | ORAL_TABLET | Freq: Every day | ORAL | 0 refills | Status: DC

## 2021-06-29 MED ORDER — TIRZEPATIDE 5 MG/0.5ML ~~LOC~~ SOAJ: 5.0000 mg | SUBCUTANEOUS | 0 refills | Status: DC

## 2021-06-29 NOTE — Progress Notes (Signed)
Subjective:    Patient ID: Amanda Davidson, female    DOB: August 04, 1970, 51 y.o.   MRN: [id number]  HPI Pt is a 51 yo obese female with T2DM, OSA, HTN, HLD, Migraines, Asthma, OSA with hx of sleeve who presents to the clinic to follow up.   Pt had ventral hernia repair surgery 02/2021. She has down well.   She did have to stop her wegovy due to card not working any more and becoming too expensive. It was really working. She tolerated well. She has noticed her sugars are rising some when she checks but does not check often. In the mornings sometimes in the 140s. No open sores or wounds. No hypoglyemic events.    .. Active Ambulatory Problems    Diagnosis Date Noted   OSA (obstructive sleep apnea) 05/24/2012   Hypothyroidism 05/24/2012   Migraine 05/24/2012   Vitamin D deficiency 05/24/2012   Asthma 05/24/2012   Depression with anxiety 05/24/2012   Hypertension, essential, benign 06/30/2012   Asthma, moderate persistent 09/18/2012   Class 3 severe obesity due to excess calories without serious comorbidity with body mass index (BMI) of 50.0 to 59.9 in adult [facility]) 09/18/2012   Laparoscopic sleeve gastrectomy Dec 2014 05/14/2013   Insulin resistance 05/24/2013   Type 2 diabetes mellitus without complication, without long-term current use of insulin (HCC) 05/19/2017   Trigger finger, left ring finger 10/23/2017   Adjustment disorder with mixed anxiety and depressed mood 10/23/2017   Pre-diabetes 11/19/2017   Tonsillar mass 04/20/2018   Tonsillar enlargement 04/20/2018   Snoring 04/21/2018   Change in stool    Colon cancer screening    Polyp of sigmoid colon    Grade II internal hemorrhoids    S/P repair of recurrent ventral hernia 02/27/2021   Hyperlipidemia LDL goal <70 07/02/2021   Resolved Ambulatory Problems    Diagnosis Date Noted   Diabetes mellitus, type 2 (HCC) 09/18/2012   Past Medical History:  Diagnosis Date   Anemia    Complication of anesthesia    Depression     Diabetes mellitus without complication (HCC)    Drug use    Exercise-induced asthma    GERD (gastroesophageal reflux disease)    Headache(784.0)    Hypertension    IBS (irritable bowel syndrome)    Migraines    Obesity    Pneumonia    PONV (postoperative nausea and vomiting)    Post-nasal drip    Shortness of breath    Sleep apnea      Review of Systems See HPI.     Objective:   Physical Exam Vitals reviewed.  Constitutional:      Appearance: Normal appearance. She is obese.  HENT:     Head: Normocephalic.  Cardiovascular:     Rate and Rhythm: Normal rate and regular rhythm.     Pulses: Normal pulses.  Pulmonary:     Effort: Pulmonary effort is normal.     Breath sounds: Normal breath sounds.  Musculoskeletal:     Right lower leg: No edema.     Left lower leg: No edema.  Neurological:     General: No focal deficit present.     Mental Status: She is alert and oriented to person, place, and time.  Psychiatric:        Mood and Affect: Mood normal.      .. Results for orders placed or performed in visit on 06/29/21  POCT HgB A1C  Result Value Ref Range  Hemoglobin A1C 6.6 (A) 4.0 - 5.6 %   HbA1c POC (<> result, manual entry)     HbA1c, POC (prediabetic range)     HbA1c, POC (controlled diabetic range)         Assessment & Plan:  Marland KitchenMarland KitchenMelissa was seen today for follow-up.  Diagnoses and all orders for this visit:  Type 2 diabetes mellitus without complication, without long-term current use of insulin (HCC) -     POCT HgB A1C  Hypertension, essential, benign  Hyperlipidemia LDL goal <70  Need for shingles vaccine -     Varicella-zoster vaccine IM (Shingrix)  Class 3 severe obesity due to excess calories without serious comorbidity with body mass index (BMI) of 50.0 to 59.9 in adult (HCC) -     tirzepatide (MOUNJARO) 2.5 MG/0.5ML Pen; Inject 2.5 mg into the skin once a week. -     tirzepatide [facility]) 5 MG/0.5ML Pen; Inject 5 mg into the skin once  a week. -     tirzepatide (MOUNJARO) 7.5 MG/0.5ML Pen; Inject 7.5 mg into the skin once a week.  Hypothyroidism, unspecified type -     levothyroxine (SYNTHROID) 137 MCG tablet; Take 1 tablet (137 mcg total) by mouth daily before breakfast.  A1C to goal.  Stay on same medications.  Added mounjaro for sugar control and diabetes.  Discussed how to administer and side effects. BP not to goal. Continue to work on this with diet and exercise.  Not on statin. Needs to be.  Discussed eye exam Shingles vaccine stared.  Declined covid vaccine.  Follow up in 3 months.

## 2021-06-29 NOTE — Telephone Encounter (Signed)
Needs PA 

## 2021-07-02 DIAGNOSIS — E785 Hyperlipidemia, unspecified: Secondary | ICD-10-CM | POA: Insufficient documentation

## 2021-07-03 ENCOUNTER — Other Ambulatory Visit: Payer: Self-pay | Admitting: Physician Assistant

## 2021-07-03 DIAGNOSIS — G43009 Migraine without aura, not intractable, without status migrainosus: Secondary | ICD-10-CM

## 2021-07-04 ENCOUNTER — Encounter: Payer: Self-pay | Admitting: Physician Assistant

## 2021-07-04 ENCOUNTER — Other Ambulatory Visit: Payer: Self-pay | Admitting: Physician Assistant

## 2021-07-04 DIAGNOSIS — J4540 Moderate persistent asthma, uncomplicated: Secondary | ICD-10-CM

## 2021-07-10 ENCOUNTER — Telehealth: Payer: Self-pay

## 2021-07-10 NOTE — Telephone Encounter (Addendum)
Initiated Prior authorization XLK:[id number] [facility]) 5 MG/0.5ML Pen  Via: Covermymeds Case:[id number] Status: Denied as of 07/10/21 Reason:Coverage for this medication is denied for the following reason(s). We reviewed the information we received about your condition and circumstances. We used plan approved criteria when making this decision. The policy states that this medication may be approved when: -The member is unable to take the required number of formulary alternatives for the given diagnosis due to an intolerance or contraindication OR -The member has tried and failed the required number of formulary alternatives. Based on the policy and the information we received your request is denied. We did not receive documentation that you meet the criteria outlined above. Formulary alternatives are: Ozempic, Rybelsus, Trulicity, Victoza. (Requirement: 3 in a class with 3 or more alternatives, 2 in a class with 2 alternatives, or 1 in a class with only 1 alternative.Notified Pt via: Mychart  [facility]: rx bin [id number] Pcn:adv [id number]

## 2021-07-13 NOTE — Telephone Encounter (Signed)
Received notice of Denial ° °Placed in Jade Breebacks basket °

## 2021-07-13 NOTE — Telephone Encounter (Signed)
Pt was on ozempic before. Let pt know of denial and ok to send ozempic. Please call patient and let her know.

## 2021-07-16 ENCOUNTER — Encounter: Payer: Self-pay | Admitting: Physician Assistant

## 2021-07-19 MED ORDER — OZEMPIC (0.25 OR 0.5 MG/DOSE) 2 MG/1.5ML ~~LOC~~ SOPN: 0.5000 mg | PEN_INJECTOR | SUBCUTANEOUS | 1 refills | Status: DC

## 2021-07-19 MED ORDER — SEMAGLUTIDE (1 MG/DOSE) 4 MG/3ML ~~LOC~~ SOPN: 1.0000 mg | PEN_INJECTOR | SUBCUTANEOUS | 0 refills | Status: DC

## 2021-07-19 NOTE — Telephone Encounter (Signed)
Patient advised.  Medication sent to pharmacy. 

## 2021-07-19 NOTE — Addendum Note (Signed)
Addended by: Narda Rutherford on: 07/19/2021 01:46 PM   Modules accepted: Orders

## 2021-08-06 ENCOUNTER — Other Ambulatory Visit: Payer: Self-pay | Admitting: Physician Assistant

## 2021-08-06 DIAGNOSIS — G43009 Migraine without aura, not intractable, without status migrainosus: Secondary | ICD-10-CM

## 2021-08-22 DIAGNOSIS — Z01419 Encounter for gynecological examination (general) (routine) without abnormal findings: Secondary | ICD-10-CM | POA: Diagnosis not present

## 2021-08-22 DIAGNOSIS — Z6841 Body Mass Index (BMI) 40.0 and over, adult: Secondary | ICD-10-CM | POA: Diagnosis not present

## 2021-08-24 ENCOUNTER — Encounter: Payer: Self-pay | Admitting: Neurology

## 2021-08-24 NOTE — Telephone Encounter (Signed)
Mychart message sent to patient.

## 2021-09-11 DIAGNOSIS — D692 Other nonthrombocytopenic purpura: Secondary | ICD-10-CM | POA: Diagnosis not present

## 2021-09-11 DIAGNOSIS — D485 Neoplasm of uncertain behavior of skin: Secondary | ICD-10-CM | POA: Diagnosis not present

## 2021-09-27 ENCOUNTER — Other Ambulatory Visit: Payer: Self-pay | Admitting: Physician Assistant

## 2021-09-27 DIAGNOSIS — E039 Hypothyroidism, unspecified: Secondary | ICD-10-CM

## 2021-09-28 ENCOUNTER — Encounter: Payer: Self-pay | Admitting: Physician Assistant

## 2021-09-28 ENCOUNTER — Other Ambulatory Visit: Payer: Self-pay | Admitting: Physician Assistant

## 2021-09-28 ENCOUNTER — Ambulatory Visit (INDEPENDENT_AMBULATORY_CARE_PROVIDER_SITE_OTHER): Payer: BC Managed Care – PPO | Admitting: Physician Assistant

## 2021-09-28 VITALS — BP 141/85 | HR 76 | Ht 66.0 in | Wt 316.0 lb

## 2021-09-28 DIAGNOSIS — E785 Hyperlipidemia, unspecified: Secondary | ICD-10-CM | POA: Diagnosis not present

## 2021-09-28 DIAGNOSIS — J4540 Moderate persistent asthma, uncomplicated: Secondary | ICD-10-CM | POA: Diagnosis not present

## 2021-09-28 DIAGNOSIS — G43009 Migraine without aura, not intractable, without status migrainosus: Secondary | ICD-10-CM

## 2021-09-28 DIAGNOSIS — E039 Hypothyroidism, unspecified: Secondary | ICD-10-CM | POA: Diagnosis not present

## 2021-09-28 DIAGNOSIS — J302 Other seasonal allergic rhinitis: Secondary | ICD-10-CM | POA: Insufficient documentation

## 2021-09-28 DIAGNOSIS — E119 Type 2 diabetes mellitus without complications: Secondary | ICD-10-CM | POA: Diagnosis not present

## 2021-09-28 LAB — POCT UA - MICROALBUMIN
Albumin/Creatinine Ratio, Urine, POC: <30
Creatinine, POC: 50 mg/dL
Microalbumin Ur, POC: 10 mg/L

## 2021-09-28 LAB — POCT GLYCOSYLATED HEMOGLOBIN (HGB A1C): Hemoglobin A1C: 6.8 % — AB (ref 4.0–5.6)

## 2021-09-28 MED ORDER — ALBUTEROL SULFATE HFA 108 (90 BASE) MCG/ACT IN AERS: 2.0000 | INHALATION_SPRAY | Freq: Two times a day (BID) | RESPIRATORY_TRACT | 1 refills | Status: DC

## 2021-09-28 MED ORDER — SUMATRIPTAN SUCCINATE 100 MG PO TABS: ORAL_TABLET | ORAL | 5 refills | Status: DC

## 2021-09-28 MED ORDER — SITAGLIPTIN PHOSPHATE 100 MG PO TABS: 100.0000 mg | ORAL_TABLET | Freq: Every day | ORAL | 0 refills | Status: DC

## 2021-09-28 NOTE — Telephone Encounter (Signed)
Please advise 

## 2021-09-28 NOTE — Progress Notes (Signed)
? ?Established Patient Office Visit ? ?Subjective   ?Patient ID: Amanda Davidson, female    DOB: [birth year]  Age: 51 y.o. MRN: [id number] ? ?Chief Complaint  ?Patient presents with  ? Diabetes  ? Follow-up  ? ? ?HPI ?Pt is a 51 yo obese female with T2DM, HTN, Asthma, Seasonal allergies who presents to the clinic today for refills.  ? ?She has not been taking ozempic because insurance would not pay for it and has a high deductible plan. She cannot tolerate metformin due to GI side effects. No open sores or wounds. Checking sugars in am and running 110-120s. No hypoglycemia. No CP, palpitations, headaches.  ? ?She is having lots of allergy symptoms. She is taking singulair but no other anti histamines. She is using her albuterol inhaler as needed every day or so.  ? ?.. ?Active Ambulatory Problems  ?  Diagnosis Date Noted  ? OSA (obstructive sleep apnea) 05/24/2012  ? Hypothyroidism 05/24/2012  ? Migraine 05/24/2012  ? Vitamin D deficiency 05/24/2012  ? Asthma 05/24/2012  ? Depression with anxiety 05/24/2012  ? Hypertension, essential, benign 06/30/2012  ? Asthma, moderate persistent 09/18/2012  ? Class 3 severe obesity due to excess calories without serious comorbidity with body mass index (BMI) of 50.0 to 59.9 in adult [facility]) 09/18/2012  ? Laparoscopic sleeve gastrectomy Dec 2014 05/14/2013  ? Insulin resistance 05/24/2013  ? Type 2 diabetes mellitus without complication, without long-term current use of insulin (HCC) 05/19/2017  ? Trigger finger, left ring finger 10/23/2017  ? Adjustment disorder with mixed anxiety and depressed mood 10/23/2017  ? Pre-diabetes 11/19/2017  ? Tonsillar mass 04/20/2018  ? Tonsillar enlargement 04/20/2018  ? Snoring 04/21/2018  ? Change in stool   ? Colon cancer screening   ? Polyp of sigmoid colon   ? Grade II internal hemorrhoids   ? S/P repair of recurrent ventral hernia 02/27/2021  ? Hyperlipidemia LDL goal <70 07/02/2021  ? Seasonal allergies 09/28/2021  ? ?Resolved Ambulatory  Problems  ?  Diagnosis Date Noted  ? Diabetes mellitus, type 2 (HCC) 09/18/2012  ? ?Past Medical History:  ?Diagnosis Date  ? Anemia   ? Complication of anesthesia   ? Depression   ? Diabetes mellitus without complication (HCC)   ? Drug use   ? Exercise-induced asthma   ? GERD (gastroesophageal reflux disease)   ? Headache(784.0)   ? Hypertension   ? IBS (irritable bowel syndrome)   ? Migraines   ? Obesity   ? Pneumonia   ? PONV (postoperative nausea and vomiting)   ? Post-nasal drip   ? Shortness of breath   ? Sleep apnea   ? ? ? ?ROS ?See HPI.  ?  ?Objective:  ?  ? ?BP (!) 141/85   Pulse 76   Ht 5\' 6"  (1.676 m)   Wt (!) 316 lb (143.3 kg)   SpO2 99%   BMI 51.00 kg/m?  ?BP Readings from Last 3 Encounters:  ?09/28/21 (!) 141/85  ?06/29/21 (!) 143/77  ?03/01/21 121/78  ? ?Wt Readings from Last 3 Encounters:  ?09/28/21 (!) 316 lb (143.3 kg)  ?06/29/21 (!) 311 lb 4.8 oz (141.2 kg)  ?02/27/21 299 lb 13.2 oz (136 kg)  ? ?  ? ?Physical Exam ?Vitals reviewed.  ?Constitutional:   ?   Appearance: Normal appearance. She is obese.  ?HENT:  ?   Head: Normocephalic.  ?Cardiovascular:  ?   Rate and Rhythm: Normal rate and regular rhythm.  ?   Pulses: Normal  pulses.  ?   Heart sounds: Normal heart sounds.  ?Pulmonary:  ?   Effort: Pulmonary effort is normal.  ?   Breath sounds: Normal breath sounds.  ?Musculoskeletal:  ?   Right lower leg: No edema.  ?   Left lower leg: No edema.  ?Neurological:  ?   General: No focal deficit present.  ?   Mental Status: She is alert and oriented to person, place, and time.  ?Psychiatric:     ?   Mood and Affect: Mood normal.  ?.. ?Results for orders placed or performed in visit on 09/28/21  ?POCT glycosylated hemoglobin (Hb A1C)  ?Result Value Ref Range  ? Hemoglobin A1C 6.8 (A) 4.0 - 5.6 %  ? HbA1c POC (<> result, manual entry)    ? HbA1c, POC (prediabetic range)    ? HbA1c, POC (controlled diabetic range)    ?POCT UA - Microalbumin  ?Result Value Ref Range  ? Microalbumin Ur, POC 10 mg/L  ?  Creatinine, POC 50 mg/dL  ? Albumin/Creatinine Ratio, Urine, POC <30   ? ? ? ? ?  ?Assessment & Plan:  ?..Amanda Davidson was seen today for diabetes and follow-up. ? ?Diagnoses and all orders for this visit: ? ?Type 2 diabetes mellitus without complication, without long-term current use of insulin (HCC) ?-     COMPLETE METABOLIC PANEL WITH GFR ?-     POCT glycosylated hemoglobin (Hb A1C) ?-     POCT UA - Microalbumin ?-     sitaGLIPtin (JANUVIA) 100 MG tablet; Take 1 tablet (100 mg total) by mouth daily. ? ?Hyperlipidemia LDL goal <70 ?-     Lipid Panel w/reflex Direct LDL ? ?Hypothyroidism, unspecified type ?-     TSH ? ?Moderate persistent asthma without complication ?-     albuterol (VENTOLIN HFA) 108 (90 Base) MCG/ACT inhaler; Inhale 2 puffs into the lungs 2 (two) times daily. Asthma ? ?Migraine without aura and without status migrainosus, not intractable ?-     SUMAtriptan (IMITREX) 100 MG tablet; TAKE 1 TABLET BY MOUTH EVERY 2 HOURS AS NEEDED FOR MIGRAINE (MAX 2/24 HRS). NEEDS APPT FOR REFILLS ? ?Seasonal allergies ? ? ?A1C under 7.  ?Fasting labs ordered ?Start [place] ?BP not to goal discussed lifestyle changes and medication. Hold on meds today ?On statin ?Need eye exam. ?Covid vaccine x2 ?Declined flu and pneumonia ?Follow up in 3 months.  ? ?Refilled migraine medication ? ?Add anti-histamine OTC to singulair and albuterol.  ? ? ? ?Tandy Gaw, PA-C ? ?

## 2021-09-28 NOTE — Patient Instructions (Addendum)
Zyrtec/clairtin/allegra with singulair as needed albuterol ?Start [place] ?

## 2021-09-29 LAB — LIPID PANEL W/REFLEX DIRECT LDL
Cholesterol: 212 mg/dL — ABNORMAL HIGH (ref <200)
HDL: 52 mg/dL (ref >=50)
LDL Cholesterol (Calc): 132 mg/dL (calc) — ABNORMAL HIGH
Non-HDL Cholesterol (Calc): 160 mg/dL (calc) — ABNORMAL HIGH (ref <130)
Total CHOL/HDL Ratio: 4.1 (calc) (ref <5.0)
Triglycerides: 150 mg/dL — ABNORMAL HIGH (ref <150)

## 2021-09-29 LAB — COMPLETE METABOLIC PANEL WITH GFR
AG Ratio: 1.4 (calc) (ref 1.0–2.5)
ALT: 45 U/L — ABNORMAL HIGH (ref 6–29)
AST: 38 U/L — ABNORMAL HIGH (ref 10–35)
Albumin: 4.1 g/dL (ref 3.6–5.1)
Alkaline phosphatase (APISO): 87 U/L (ref 37–153)
BUN: 14 mg/dL (ref 7–25)
CO2: 30 mmol/L (ref 20–32)
Calcium: 9.9 mg/dL (ref 8.6–10.4)
Chloride: 101 mmol/L (ref 98–110)
Creat: 0.78 mg/dL (ref 0.50–1.03)
Globulin: 2.9 g/dL (calc) (ref 1.9–3.7)
Glucose, Bld: 127 mg/dL — ABNORMAL HIGH (ref 65–99)
Potassium: 4.5 mmol/L (ref 3.5–5.3)
Sodium: 138 mmol/L (ref 135–146)
Total Bilirubin: 0.6 mg/dL (ref 0.2–1.2)
Total Protein: 7 g/dL (ref 6.1–8.1)
eGFR: 91 mL/min/{1.73_m2} (ref >=60)

## 2021-09-29 LAB — TSH: TSH: 2.9 mIU/L

## 2021-10-01 NOTE — Progress Notes (Signed)
LDL, bad cholesterol, goal is under 70 with diabetes and your are 132. You need to start a statin to help lower LDL and overall CV risk. Are you ok with sending?  ? ?Liver enzymes up some but probably due to higher sugars and weight.  ? ?Thyroid stable.

## 2021-10-04 ENCOUNTER — Encounter: Payer: Self-pay | Admitting: Physician Assistant

## 2021-10-05 MED ORDER — ATORVASTATIN CALCIUM 20 MG PO TABS: 20.0000 mg | ORAL_TABLET | Freq: Every day | ORAL | 3 refills | Status: DC

## 2021-10-25 ENCOUNTER — Other Ambulatory Visit: Payer: Self-pay | Admitting: Physician Assistant

## 2021-10-25 DIAGNOSIS — E039 Hypothyroidism, unspecified: Secondary | ICD-10-CM

## 2021-12-26 DIAGNOSIS — L739 Follicular disorder, unspecified: Secondary | ICD-10-CM | POA: Diagnosis not present

## 2021-12-26 DIAGNOSIS — L570 Actinic keratosis: Secondary | ICD-10-CM | POA: Diagnosis not present

## 2021-12-26 DIAGNOSIS — D485 Neoplasm of uncertain behavior of skin: Secondary | ICD-10-CM | POA: Diagnosis not present

## 2021-12-26 DIAGNOSIS — L814 Other melanin hyperpigmentation: Secondary | ICD-10-CM | POA: Diagnosis not present

## 2021-12-26 DIAGNOSIS — D235 Other benign neoplasm of skin of trunk: Secondary | ICD-10-CM | POA: Diagnosis not present

## 2022-01-02 ENCOUNTER — Encounter: Payer: Self-pay | Admitting: Neurology

## 2022-01-11 ENCOUNTER — Ambulatory Visit: Payer: BC Managed Care – PPO | Admitting: Physician Assistant

## 2022-01-16 ENCOUNTER — Other Ambulatory Visit: Payer: Self-pay | Admitting: Physician Assistant

## 2022-01-16 DIAGNOSIS — E119 Type 2 diabetes mellitus without complications: Secondary | ICD-10-CM

## 2022-01-25 ENCOUNTER — Ambulatory Visit: Payer: BC Managed Care – PPO | Admitting: Physician Assistant

## 2022-04-04 DIAGNOSIS — Z6841 Body Mass Index (BMI) 40.0 and over, adult: Secondary | ICD-10-CM | POA: Diagnosis not present

## 2022-04-04 DIAGNOSIS — J029 Acute pharyngitis, unspecified: Secondary | ICD-10-CM | POA: Diagnosis not present

## 2022-05-17 ENCOUNTER — Other Ambulatory Visit: Payer: Self-pay | Admitting: Physician Assistant

## 2022-05-17 DIAGNOSIS — E039 Hypothyroidism, unspecified: Secondary | ICD-10-CM

## 2022-05-22 ENCOUNTER — Encounter: Payer: Self-pay | Admitting: Family Medicine

## 2022-05-22 NOTE — Progress Notes (Signed)
0

## 2022-08-02 ENCOUNTER — Ambulatory Visit (INDEPENDENT_AMBULATORY_CARE_PROVIDER_SITE_OTHER): Payer: BC Managed Care – PPO | Admitting: Physician Assistant

## 2022-08-02 ENCOUNTER — Encounter: Payer: Self-pay | Admitting: Physician Assistant

## 2022-08-02 VITALS — BP 168/90 | HR 79 | Temp 97.8°F | Ht 66.0 in | Wt 323.0 lb

## 2022-08-02 DIAGNOSIS — R030 Elevated blood-pressure reading, without diagnosis of hypertension: Secondary | ICD-10-CM

## 2022-08-02 DIAGNOSIS — J40 Bronchitis, not specified as acute or chronic: Secondary | ICD-10-CM

## 2022-08-02 DIAGNOSIS — J329 Chronic sinusitis, unspecified: Secondary | ICD-10-CM | POA: Diagnosis not present

## 2022-08-02 DIAGNOSIS — J4540 Moderate persistent asthma, uncomplicated: Secondary | ICD-10-CM

## 2022-08-02 MED ORDER — BENZONATATE 200 MG PO CAPS: 200.0000 mg | ORAL_CAPSULE | Freq: Three times a day (TID) | ORAL | 0 refills | Status: DC | PRN

## 2022-08-02 MED ORDER — AZITHROMYCIN 250 MG PO TABS: ORAL_TABLET | ORAL | 0 refills | Status: DC

## 2022-08-02 MED ORDER — HYDROCOD POLI-CHLORPHE POLI ER 10-8 MG/5ML PO SUER: 5.0000 mL | Freq: Two times a day (BID) | ORAL | 0 refills | Status: DC | PRN

## 2022-08-02 MED ORDER — PREDNISONE 50 MG PO TABS: ORAL_TABLET | ORAL | 0 refills | Status: DC

## 2022-08-02 MED ORDER — MONTELUKAST SODIUM 10 MG PO TABS: ORAL_TABLET | ORAL | 3 refills | Status: DC

## 2022-08-02 NOTE — Patient Instructions (Signed)
Avoid sudafed

## 2022-08-02 NOTE — Progress Notes (Signed)
   Acute Office Visit  Subjective:     Patient ID: Amanda Davidson, female    DOB: June 14, 1970, 52 y.o.   MRN: [id number]  Chief Complaint  Patient presents with   Cough    Cough  Patient is in today for a month of cough and fatigue. Started out with upper respiratory symptoms of congestion, headaches, sore throat but those symptoms have resolved and feels it has gone to her lungs now. Chest feels tight and not able to cough anything up. She is taking mucinex and delsym with no relief. She uses her albuterol when she gets into coughing spells and it helps. Denies ear pain, shortness of breath, fever, chills. Patient has a history of bronchitis.   BP elevated 177/91 in office. Patient says she took pseudoephedrine this morning. Says her BP normally runs 130s/90.   Review of Systems  Respiratory:  Positive for cough.   See HPI      Objective:    BP (!) 168/90   Pulse 79   Temp 97.8 F (36.6 C) (Oral)   Ht '5\' 6"'$  (1.676 m)   Wt (!) 323 lb (146.5 kg)   SpO2 98%   BMI 52.13 kg/m  BP Readings from Last 3 Encounters:  08/02/22 (!) 168/90  09/28/21 (!) 141/85  06/29/21 (!) 143/77      Physical Exam Constitutional:      Appearance: She is obese.  HENT:     Head: Normocephalic and atraumatic.     Right Ear: Tympanic membrane normal.     Left Ear: Tympanic membrane normal.     Nose: No congestion.     Mouth/Throat:     Pharynx: Oropharynx is clear.  Eyes:     Extraocular Movements: Extraocular movements intact.  Cardiovascular:     Rate and Rhythm: Normal rate and regular rhythm.  Pulmonary:     Effort: Pulmonary effort is normal.     Breath sounds: Normal breath sounds.  Musculoskeletal:        General: Normal range of motion.  Neurological:     Mental Status: She is alert and oriented to person, place, and time.           Assessment & Plan:   Deja was seen today for cough.  Diagnoses and all orders for this visit:  Sinobronchitis -      azithromycin (ZITHROMAX Z-PAK) 250 MG tablet; Take 2 tablets (500 mg) on  Day 1,  followed by 1 tablet (250 mg) once daily on Days 2 through 5. -     predniSONE (DELTASONE) 50 MG tablet; One tab PO daily for 5 days. -     benzonatate (TESSALON) 200 MG capsule; Take 1 capsule (200 mg total) by mouth 3 (three) times daily as needed. -     chlorpheniramine-HYDROcodone (TUSSIONEX) 10-8 MG/5ML; Take 5 mLs by mouth every 12 (twelve) hours as needed for cough (cough, will cause drowsiness.).  Moderate persistent asthma without complication -     montelukast (SINGULAIR) 10 MG tablet; TAKE 1 TABLET BY MOUTH EVERY DAY  Elevated blood pressure reading  Start Azithromycin and Prednisone pack x 5 days  Start Tessalon pearls TID and Tussionex BID as needed for cough  Stop Pseudoephedrine due to elevated BP  Encouraged checking BP at home and schedule CPE and BP recheck soon Refilled singulair for asthma   Return for need appt for CPE and to recheck BP.  Iran Planas, PA-C

## 2022-08-09 ENCOUNTER — Encounter: Payer: Self-pay | Admitting: Physician Assistant

## 2022-08-09 MED ORDER — CEFDINIR 300 MG PO CAPS: 300.0000 mg | ORAL_CAPSULE | Freq: Two times a day (BID) | ORAL | 0 refills | Status: DC

## 2022-08-09 NOTE — Telephone Encounter (Signed)
I don;'t see any antibiotic on her list taht starts with a C.  I will send in cefdinir though.  But I just want her to know that I could not find what she was talking about specifically.  But if she is not better after this round of antibiotics she will need to come in for chest x-ray.  Meds ordered this encounter  Medications   cefdinir (OMNICEF) 300 MG capsule    Sig: Take 1 capsule (300 mg total) by mouth 2 (two) times daily.    Dispense:  10 capsule    Refill:  0

## 2022-08-23 ENCOUNTER — Ambulatory Visit: Payer: BC Managed Care – PPO | Admitting: Physician Assistant

## 2022-08-27 ENCOUNTER — Ambulatory Visit (INDEPENDENT_AMBULATORY_CARE_PROVIDER_SITE_OTHER): Payer: BC Managed Care – PPO | Admitting: Physician Assistant

## 2022-08-27 VITALS — BP 142/60 | HR 82 | Ht 66.0 in | Wt 321.0 lb

## 2022-08-27 DIAGNOSIS — E785 Hyperlipidemia, unspecified: Secondary | ICD-10-CM | POA: Diagnosis not present

## 2022-08-27 DIAGNOSIS — E559 Vitamin D deficiency, unspecified: Secondary | ICD-10-CM

## 2022-08-27 DIAGNOSIS — Z0000 Encounter for general adult medical examination without abnormal findings: Secondary | ICD-10-CM

## 2022-08-27 DIAGNOSIS — E1165 Type 2 diabetes mellitus with hyperglycemia: Secondary | ICD-10-CM

## 2022-08-27 DIAGNOSIS — Z1159 Encounter for screening for other viral diseases: Secondary | ICD-10-CM

## 2022-08-27 DIAGNOSIS — E119 Type 2 diabetes mellitus without complications: Secondary | ICD-10-CM | POA: Diagnosis not present

## 2022-08-27 DIAGNOSIS — Z1231 Encounter for screening mammogram for malignant neoplasm of breast: Secondary | ICD-10-CM

## 2022-08-27 DIAGNOSIS — Z6841 Body Mass Index (BMI) 40.0 and over, adult: Secondary | ICD-10-CM

## 2022-08-27 DIAGNOSIS — E039 Hypothyroidism, unspecified: Secondary | ICD-10-CM | POA: Diagnosis not present

## 2022-08-27 LAB — POCT GLYCOSYLATED HEMOGLOBIN (HGB A1C): Hemoglobin A1C: 10.3 % — AB (ref 4.0–5.6)

## 2022-08-27 MED ORDER — MOUNJARO 7.5 MG/0.5ML ~~LOC~~ SOAJ: 7.5000 mg | SUBCUTANEOUS | 0 refills | Status: DC

## 2022-08-27 MED ORDER — DAPAGLIFLOZIN PROPANEDIOL 10 MG PO TABS: 10.0000 mg | ORAL_TABLET | Freq: Every day | ORAL | 0 refills | Status: DC

## 2022-08-27 MED ORDER — TIRZEPATIDE 5 MG/0.5ML ~~LOC~~ SOAJ: 5.0000 mg | SUBCUTANEOUS | 0 refills | Status: DC

## 2022-08-27 MED ORDER — ATORVASTATIN CALCIUM 40 MG PO TABS: 40.0000 mg | ORAL_TABLET | Freq: Every day | ORAL | 3 refills | Status: DC

## 2022-08-27 MED ORDER — MOUNJARO 2.5 MG/0.5ML ~~LOC~~ SOAJ: 2.5000 mg | SUBCUTANEOUS | 0 refills | Status: DC

## 2022-08-27 NOTE — Progress Notes (Signed)
Complete physical exam  Patient: Amanda Davidson   DOB: [birth year]   52 y.o. Female  MRN: [id number]  Subjective:    Chief Complaint  Patient presents with   Annual Exam    Amanda Davidson is a 52 y.o. female who presents today for a complete physical exam. She reports consuming a general diet. The patient does not participate in regular exercise at present. She generally feels fairly well. She reports sleeping fairly well. She does have additional problems to discuss today.   She knows she needs to lose weight and start medication for diabetes. She has checked some random sugars and have been in 200s and once in 400s. She is very thirsty and urinating a lot.  Most recent fall risk assessment:    08/02/2022   11:12 AM  Fall Risk   Falls in the past year? 0  Number falls in past yr: 0  Injury with Fall? 0  Risk for fall due to : No Fall Risks  Follow up Falls evaluation completed     Most recent depression screenings:    08/27/2022    8:55 AM 08/02/2022   11:12 AM  PHQ 2/9 Scores  PHQ - 2 Score 0 0  PHQ- 9 Score 0     Vision:Within last year and Dental: No current dental problems and Receives regular dental care  Patient Active Problem List   Diagnosis Date Noted   Elevated blood pressure reading 08/02/2022   Seasonal allergies 09/28/2021   Hyperlipidemia LDL goal <70 07/02/2021   S/P repair of recurrent ventral hernia 02/27/2021   Change in stool    Colon cancer screening    Polyp of sigmoid colon    Grade II internal hemorrhoids    Snoring 04/21/2018   Tonsillar mass 04/20/2018   Tonsillar enlargement 04/20/2018   Pre-diabetes 11/19/2017   Trigger finger, left ring finger 10/23/2017   Adjustment disorder with mixed anxiety and depressed mood 10/23/2017   Type 2 diabetes mellitus without complication, without long-term current use of insulin ([place]) 05/19/2017   Insulin resistance 05/24/2013   Laparoscopic sleeve gastrectomy Dec 2014 05/14/2013   Asthma,  moderate persistent 09/18/2012   Class 3 severe obesity due to excess calories with serious comorbidity and body mass index (BMI) of 50.0 to 59.9 in adult ([place]) 09/18/2012   Hypertension, essential, benign 06/30/2012   OSA (obstructive sleep apnea) 05/24/2012   Hypothyroidism 05/24/2012   Migraine 05/24/2012   Vitamin D deficiency 05/24/2012   Asthma 05/24/2012   Depression with anxiety 05/24/2012   Past Medical History:  Diagnosis Date   Anemia    Asthma    Complication of anesthesia    NAUSEA   Depression    Diabetes mellitus without complication ([place])    Drug use    Exercise-induced asthma    GERD (gastroesophageal reflux disease)    Headache(784.0)    MIGRAINES   Hypertension    Hypothyroidism    IBS (irritable bowel syndrome)    Migraines    Obesity    Pneumonia    PONV (postoperative nausea and vomiting)    Post-nasal drip    Shortness of breath    Sleep apnea    C-PAP but doesnt use it regularly    Vitamin D deficiency    Past Surgical History:  Procedure Laterality Date   BIOPSY  05/02/2020   Procedure: BIOPSY;  Surgeon: Lavena Bullion, DO;  Location: [facility];  Service: Gastroenterology;;   COLONOSCOPY WITH PROPOFOL N/A 05/02/2020  Procedure: COLONOSCOPY WITH PROPOFOL;  Surgeon: Lavena Bullion, DO;  Location: [facility];  Service: Gastroenterology;  Laterality: N/A;   HERNIA REPAIR     Umbilical around [id number] and then repair 2015   LAPAROSCOPIC ASSISTED VENTRAL HERNIA REPAIR N/A 02/27/2021   Procedure: LAPAROSCOPIC ASSISTED VENTRAL HERNIA REPAIR WITH MESH;  Surgeon: Johnathan Hausen, MD;  Location: [facility];  Service: General;  Laterality: N/A;   LAPAROSCOPIC GASTRIC SLEEVE RESECTION N/A 05/14/2013   Procedure: LAPAROSCOPIC GASTRIC SLEEVE RESECTION;  Surgeon: Pedro Earls, MD;  Location: [facility];  Service: General;  Laterality: N/A;   POLYPECTOMY  05/02/2020   Procedure: POLYPECTOMY;  Surgeon: Lavena Bullion, DO;  Location: [facility];   Service: Gastroenterology;;   TONSILLECTOMY     UMBILICAL HERNIA REPAIR  05/14/2013   Procedure: HERNIA REPAIR UMBILICAL ADULT;  Surgeon: Pedro Earls, MD;  Location: [facility];  Service: General;;   UPPER GI ENDOSCOPY  05/14/2013   Procedure: UPPER GI ENDOSCOPY;  Surgeon: Pedro Earls, MD;  Location: [facility];  Service: General;;   Family History  Problem Relation Age of Onset   Hypertension Mother    Heart disease Mother    Depression Mother    Hypertension Father    Diabetes Father    Sleep apnea Father    Heart disease Maternal Grandfather    Heart attack Maternal Grandfather    Cancer Maternal Grandfather        bladder   Hyperlipidemia Other    Sleep apnea Other    Esophageal cancer Paternal Grandfather    Colon cancer Maternal Great-grandmother    Allergies  Allergen Reactions   Metformin And Related     Diarrhea/GI upset      Patient Care Team: Lavada Mesi as PCP - General (Family Medicine) Himmelrich, Bryson Ha, RD (Inactive) as Dietitian [facility])   Outpatient Medications Prior to Visit  Medication Sig   albuterol (VENTOLIN HFA) 108 (90 Base) MCG/ACT inhaler Inhale 2 puffs into the lungs 2 (two) times daily. Asthma   levothyroxine (SYNTHROID) 137 MCG tablet Take 1 tablet (137 mcg total) by mouth daily before breakfast. Needs labs/appt for refills   montelukast (SINGULAIR) 10 MG tablet TAKE 1 TABLET BY MOUTH EVERY DAY   SUMAtriptan (IMITREX) 100 MG tablet TAKE 1 TABLET BY MOUTH EVERY 2 HOURS AS NEEDED FOR MIGRAINE (MAX 2/24 HRS). NEEDS APPT FOR REFILLS   [DISCONTINUED] azithromycin (ZITHROMAX Z-PAK) 250 MG tablet Take 2 tablets (500 mg) on  Day 1,  followed by 1 tablet (250 mg) once daily on Days 2 through 5.   [DISCONTINUED] benzonatate (TESSALON) 200 MG capsule Take 1 capsule (200 mg total) by mouth 3 (three) times daily as needed.   [DISCONTINUED] cefdinir (OMNICEF) 300 MG capsule Take 1 capsule (300 mg total) by mouth 2 (two) times daily.    [DISCONTINUED] chlorpheniramine-HYDROcodone (TUSSIONEX) 10-8 MG/5ML Take 5 mLs by mouth every 12 (twelve) hours as needed for cough (cough, will cause drowsiness.).   [DISCONTINUED] predniSONE (DELTASONE) 50 MG tablet One tab PO daily for 5 days.   Facility-Administered Medications Prior to Visit  Medication Dose Route Frequency Provider   polyethylene glycol powder (GLYCOLAX/MIRALAX) container 255 g  1 Container Oral Once Johnathan Hausen, MD    ROS   See HPI.      Objective:     BP (!) 142/60   Pulse 82   Ht 5\' 6"  (1.676 m)   Wt (!) 321 lb (145.6 kg)   SpO2 99%  BMI 51.81 kg/m  BP Readings from Last 3 Encounters:  08/27/22 (!) 142/60  08/02/22 (!) 168/90  09/28/21 (!) 141/85   Wt Readings from Last 3 Encounters:  08/27/22 (!) 321 lb (145.6 kg)  08/02/22 (!) 323 lb (146.5 kg)  09/28/21 (!) 316 lb (143.3 kg)      Physical Exam   BP (!) 142/60   Pulse 82   Ht 5\' 6"  (1.676 m)   Wt (!) 321 lb (145.6 kg)   SpO2 99%   BMI 51.81 kg/m   General Appearance:    Alert, cooperative, morbidly obese, no distress, appears stated age  Head:    Normocephalic, without obvious abnormality, atraumatic  Eyes:    PERRL, conjunctiva/corneas clear, EOM's intact, fundi    benign, both eyes  Ears:    Normal TM's and external ear canals, both ears  Nose:   Nares normal, septum midline, mucosa normal, no drainage    or sinus tenderness  Throat:   Lips, mucosa, and tongue normal; teeth and gums normal  Neck:   Supple, symmetrical, trachea midline, no adenopathy;    thyroid:  no enlargement/tenderness/nodules; no carotid   bruit or JVD  Back:     Symmetric, no curvature, ROM normal, no CVA tenderness  Lungs:     Clear to auscultation bilaterally, respirations unlabored  Chest Wall:    No tenderness or deformity   Heart:    Regular rate and rhythm, S1 and S2 normal, no murmur, rub   or gallop     Abdomen:     Soft, non-tender, bowel sounds active all four quadrants,    no masses, no  organomegaly        Extremities:   Extremities normal, atraumatic, no cyanosis or edema  Pulses:   2+ and symmetric all extremities  Skin:   Skin color, texture, turgor normal, no rashes or lesions  Lymph nodes:   Cervical, supraclavicular, and axillary nodes normal  Neurologic:   CNII-XII intact, normal strength, sensation and reflexes    throughout    Results for orders placed or performed in visit on 08/27/22  POCT glycosylated hemoglobin (Hb A1C)  Result Value Ref Range   Hemoglobin A1C 10.3 (A) 4.0 - 5.6 %   HbA1c POC (<> result, manual entry)     HbA1c, POC (prediabetic range)     HbA1c, POC (controlled diabetic range)         Assessment & Plan:    Routine Health Maintenance and Physical Exam  Immunization History  Administered Date(s) Administered   Influenza,inj,Quad PF,6+ Mos 01/24/2014, 05/09/2018, 05/09/2018, 02/25/2019   Influenza-Unspecified 04/17/2016   PFIZER(Purple Top)SARS-COV-2 Vaccination 02/16/2020, 03/08/2020   Pneumococcal Polysaccharide-23 03/29/2013   Tdap 10/22/2017   Zoster Recombinat (Shingrix) 06/29/2021    Health Maintenance  Topic Date Due   Hepatitis C Screening  Never done   Zoster Vaccines- Shingrix (2 of 2) 08/24/2021   OPHTHALMOLOGY EXAM  08/27/2022 (Originally 08/13/2016)   INFLUENZA VACCINE  09/01/2022 (Originally 01/01/2022)   HIV Screening  09/29/2022 (Originally 04/21/1984)   MAMMOGRAM  08/27/2023 (Originally 03/27/2022)   COVID-19 Vaccine (3 - 2023-24 season) 08/27/2023 (Originally 02/01/2022)   Diabetic kidney evaluation - eGFR measurement  09/29/2022   Diabetic kidney evaluation - Urine ACR  09/29/2022   FOOT EXAM  09/29/2022   PAP SMEAR-Modifier  12/14/2022   HEMOGLOBIN A1C  02/27/2023   DTaP/Tdap/Td (2 - Td or Tdap) 10/23/2027   COLONOSCOPY (Pts 45-58yrs Insurance coverage will need to be confirmed)  05/02/2030  HPV VACCINES  Aged Out    Discussed health benefits of physical activity, and encouraged her to engage in  regular exercise appropriate for her age and condition.  Marland KitchenLenna Sciara was seen today for annual exam.  Diagnoses and all orders for this visit:  Routine physical examination -     TSH -     Lipid Panel w/reflex Direct LDL -     COMPLETE METABOLIC PANEL WITH GFR -     CBC with Differential/Platelet -     Vitamin D (25 hydroxy)  Uncontrolled type 2 diabetes mellitus with hyperglycemia (HCC) -     POCT glycosylated hemoglobin (Hb A1C) -     COMPLETE METABOLIC PANEL WITH GFR -     tirzepatide (MOUNJARO) 2.5 MG/0.5ML Pen; Inject 2.5 mg into the skin once a week. -     tirzepatide [facility]) 5 MG/0.5ML Pen; Inject 5 mg into the skin once a week. -     tirzepatide (MOUNJARO) 7.5 MG/0.5ML Pen; Inject 7.5 mg into the skin once a week. -     dapagliflozin propanediol (FARXIGA) 10 MG TABS tablet; Take 1 tablet (10 mg total) by mouth daily.  Hyperlipidemia LDL goal <70 -     Lipid Panel w/reflex Direct LDL -     atorvastatin (LIPITOR) 40 MG tablet; Take 1 tablet (40 mg total) by mouth daily.  Hypothyroidism, unspecified type -     TSH  Vitamin D deficiency -     Vitamin D (25 hydroxy)  Encounter for hepatitis C screening test for low risk patient -     Hepatitis C Antibody  Class 3 severe obesity due to excess calories with serious comorbidity and body mass index (BMI) of 50.0 to 59.9 in adult [facility])   .Marland Kitchen Discussed 150 minutes of exercise a week.  Encouraged vitamin D 1000 units and Calcium 1300mg  or 4 servings of dairy a day.  PHQ no concerns Fasting labs ordered A1C NOT to goal Added mounjaro and farxiga Discussed side effects Discussed CV risk and added lipitor BP not to goal Needs eye exam Follow up in 3 months Declined shingrix today Colonoscopy UTD Needs mammogram Pap UTD for a few more months, sees GYN  Pt has been screening for sleep apnea in the past and reports she does not have after her tonsils were removed.   Return in about 3 months (around 11/27/2022).      Iran Planas, PA-C

## 2022-08-27 NOTE — Patient Instructions (Addendum)
Start [employer] weekly Start farxiga daily with lipitor  Health Maintenance, Female Adopting a healthy lifestyle and getting preventive care are important in promoting health and wellness. Ask your health care provider about: The right schedule for you to have regular tests and exams. Things you can do on your own to prevent diseases and keep yourself healthy. What should I know about diet, weight, and exercise? Eat a healthy diet  Eat a diet that includes plenty of vegetables, fruits, low-fat dairy products, and lean protein. Do not eat a lot of foods that are high in solid fats, added sugars, or sodium. Maintain a healthy weight Body mass index (BMI) is used to identify weight problems. It estimates body fat based on height and weight. Your health care provider can help determine your BMI and help you achieve or maintain a healthy weight. Get regular exercise Get regular exercise. This is one of the most important things you can do for your health. Most adults should: Exercise for at least 150 minutes each week. The exercise should increase your heart rate and make you sweat (moderate-intensity exercise). Do strengthening exercises at least twice a week. This is in addition to the moderate-intensity exercise. Spend less time sitting. Even light physical activity can be beneficial. Watch cholesterol and blood lipids Have your blood tested for lipids and cholesterol at 54 years of age, then have this test every 5 years. Have your cholesterol levels checked more often if: Your lipid or cholesterol levels are high. You are older than 54 years of age. You are at high risk for heart disease. What should I know about cancer screening? Depending on your health history and family history, you may need to have cancer screening at various ages. This may include screening for: Breast cancer. Cervical cancer. Colorectal cancer. Skin cancer. Lung cancer. What should I know about heart disease,  diabetes, and high blood pressure? Blood pressure and heart disease High blood pressure causes heart disease and increases the risk of stroke. This is more likely to develop in people who have high blood pressure readings or are overweight. Have your blood pressure checked: Every 3-5 years if you are 83-103 years of age. Every year if you are 26 years old or older. Diabetes Have regular diabetes screenings. This checks your fasting blood sugar level. Have the screening done: Once every three years after age 84 if you are at a normal weight and have a low risk for diabetes. More often and at a younger age if you are overweight or have a high risk for diabetes. What should I know about preventing infection? Hepatitis B If you have a higher risk for hepatitis B, you should be screened for this virus. Talk with your health care provider to find out if you are at risk for hepatitis B infection. Hepatitis C Testing is recommended for: Everyone born from [birth year] through 1967. Anyone with known risk factors for hepatitis C. Sexually transmitted infections (STIs) Get screened for STIs, including gonorrhea and chlamydia, if: You are sexually active and are younger than 54 years of age. You are older than 54 years of age and your health care provider tells you that you are at risk for this type of infection. Your sexual activity has changed since you were last screened, and you are at increased risk for chlamydia or gonorrhea. Ask your health care provider if you are at risk. Ask your health care provider about whether you are at high risk for HIV. Your health care provider  may recommend a prescription medicine to help prevent HIV infection. If you choose to take medicine to prevent HIV, you should first get tested for HIV. You should then be tested every 3 months for as long as you are taking the medicine. Pregnancy If you are about to stop having your period (premenopausal) and you may become pregnant,  seek counseling before you get pregnant. Take 400 to 800 micrograms (mcg) of folic acid every day if you become pregnant. Ask for birth control (contraception) if you want to prevent pregnancy. Osteoporosis and menopause Osteoporosis is a disease in which the bones lose minerals and strength with aging. This can result in bone fractures. If you are 26 years old or older, or if you are at risk for osteoporosis and fractures, ask your health care provider if you should: Be screened for bone loss. Take a calcium or vitamin D supplement to lower your risk of fractures. Be given hormone replacement therapy (HRT) to treat symptoms of menopause. Follow these instructions at home: Alcohol use Do not drink alcohol if: Your health care provider tells you not to drink. You are pregnant, may be pregnant, or are planning to become pregnant. If you drink alcohol: Limit how much you have to: 0-1 drink a day. Know how much alcohol is in your drink. In the [place] one drink equals one 12 oz bottle of beer (355 mL), one 5 oz glass of wine (148 mL), or one 1 oz glass of hard liquor (44 mL). Lifestyle Do not use any products that contain nicotine or tobacco. These products include cigarettes, chewing tobacco, and vaping devices, such as e-cigarettes. If you need help quitting, ask your health care provider. Do not use street drugs. Do not share needles. Ask your health care provider for help if you need support or information about quitting drugs. General instructions Schedule regular health, dental, and eye exams. Stay current with your vaccines. Tell your health care provider if: You often feel depressed. You have ever been abused or do not feel safe at home. Summary Adopting a healthy lifestyle and getting preventive care are important in promoting health and wellness. Follow your health care provider's instructions about healthy diet, exercising, and getting tested or screened for diseases. Follow your  health care provider's instructions on monitoring your cholesterol and blood pressure. This information is not intended to replace advice given to you by your health care provider. Make sure you discuss any questions you have with your health care provider. Document Revised: 10/09/2020 Document Reviewed: 10/09/2020 Elsevier Patient Education  [place].

## 2022-08-28 ENCOUNTER — Encounter: Payer: Self-pay | Admitting: Physician Assistant

## 2022-08-28 LAB — CBC WITH DIFFERENTIAL/PLATELET
Absolute Monocytes: 458 cells/uL (ref 200–950)
Basophils Absolute: 63 cells/uL (ref 0–200)
Basophils Relative: 0.8 %
Eosinophils Absolute: 158 cells/uL (ref 15–500)
Eosinophils Relative: 2 %
HCT: 44 % (ref 35.0–45.0)
Hemoglobin: 14.6 g/dL (ref 11.7–15.5)
Lymphs Abs: 2157 cells/uL (ref 850–3900)
MCH: 29.3 pg (ref 27.0–33.0)
MCHC: 33.2 g/dL (ref 32.0–36.0)
MCV: 88.2 fL (ref 80.0–100.0)
MPV: 11.6 fL (ref 7.5–12.5)
Monocytes Relative: 5.8 %
Neutro Abs: 5064 cells/uL (ref 1500–7800)
Neutrophils Relative %: 64.1 %
Platelets: 287 10*3/uL (ref 140–400)
RBC: 4.99 10*6/uL (ref 3.80–5.10)
RDW: 13.2 % (ref 11.0–15.0)
Total Lymphocyte: 27.3 %
WBC: 7.9 10*3/uL (ref 3.8–10.8)

## 2022-08-28 LAB — COMPLETE METABOLIC PANEL WITH GFR
AG Ratio: 1.4 (calc) (ref 1.0–2.5)
ALT: 38 U/L — ABNORMAL HIGH (ref 6–29)
AST: 29 U/L (ref 10–35)
Albumin: 4 g/dL (ref 3.6–5.1)
Alkaline phosphatase (APISO): 102 U/L (ref 37–153)
BUN: 11 mg/dL (ref 7–25)
CO2: 32 mmol/L (ref 20–32)
Calcium: 9.7 mg/dL (ref 8.6–10.4)
Chloride: 98 mmol/L (ref 98–110)
Creat: 0.62 mg/dL (ref 0.50–1.03)
Globulin: 2.9 g/dL (calc) (ref 1.9–3.7)
Glucose, Bld: 232 mg/dL — ABNORMAL HIGH (ref 65–99)
Potassium: 4.5 mmol/L (ref 3.5–5.3)
Sodium: 137 mmol/L (ref 135–146)
Total Bilirubin: 0.5 mg/dL (ref 0.2–1.2)
Total Protein: 6.9 g/dL (ref 6.1–8.1)
eGFR: 106 mL/min/{1.73_m2} (ref >=60)

## 2022-08-28 LAB — VITAMIN D 25 HYDROXY (VIT D DEFICIENCY, FRACTURES): Vit D, 25-Hydroxy: 27 ng/mL — ABNORMAL LOW (ref 30–100)

## 2022-08-28 LAB — LIPID PANEL W/REFLEX DIRECT LDL
Cholesterol: 211 mg/dL — ABNORMAL HIGH (ref <200)
HDL: 42 mg/dL — ABNORMAL LOW (ref >=50)
LDL Cholesterol (Calc): 133 mg/dL (calc) — ABNORMAL HIGH
Non-HDL Cholesterol (Calc): 169 mg/dL (calc) — ABNORMAL HIGH (ref <130)
Total CHOL/HDL Ratio: 5 (calc) — ABNORMAL HIGH (ref <5.0)
Triglycerides: 223 mg/dL — ABNORMAL HIGH (ref <150)

## 2022-08-28 LAB — TSH: TSH: 2.96 mIU/L

## 2022-08-28 MED ORDER — VITAMIN D (ERGOCALCIFEROL) 1.25 MG (50000 UNIT) PO CAPS: 50000.0000 [IU] | ORAL_CAPSULE | ORAL | 1 refills | Status: DC

## 2022-08-28 NOTE — Progress Notes (Signed)
Amanda Davidson,   Hemoglobin looks good.  Thyroid looks good.  Vitamin D not to goal. Make sure taking 1000 to 2000 units daily with dairy.  Kidney looks good.  One liver enzyme is elevated but likely would normalize with sugar control.  LdL elevated. Goal is under 70.   It is recommended that you start the lipitor prescribed for cholesterol.   Marland Kitchen.The 10-year ASCVD risk score (Arnett DK, et al., 2019) is: 5.4%   Values used to calculate the score:     Age: 52 years     Sex: Female     Is Non-Hispanic African American: No     Diabetic: Yes     Tobacco smoker: No     Systolic Blood Pressure: [id number] mmHg     Is BP treated: No     HDL Cholesterol: 42 mg/dL     Total Cholesterol: 211 mg/dL

## 2022-09-04 DIAGNOSIS — Z124 Encounter for screening for malignant neoplasm of cervix: Secondary | ICD-10-CM | POA: Diagnosis not present

## 2022-09-04 DIAGNOSIS — Z6841 Body Mass Index (BMI) 40.0 and over, adult: Secondary | ICD-10-CM | POA: Diagnosis not present

## 2022-09-04 DIAGNOSIS — Z01411 Encounter for gynecological examination (general) (routine) with abnormal findings: Secondary | ICD-10-CM | POA: Diagnosis not present

## 2022-09-04 DIAGNOSIS — Z1151 Encounter for screening for human papillomavirus (HPV): Secondary | ICD-10-CM | POA: Diagnosis not present

## 2022-09-04 DIAGNOSIS — L293 Anogenital pruritus, unspecified: Secondary | ICD-10-CM | POA: Diagnosis not present

## 2022-09-09 LAB — HM PAP SMEAR: HPV, high-risk: NEGATIVE

## 2022-09-20 ENCOUNTER — Other Ambulatory Visit: Payer: Self-pay | Admitting: Physician Assistant

## 2022-09-20 DIAGNOSIS — E039 Hypothyroidism, unspecified: Secondary | ICD-10-CM

## 2022-10-06 ENCOUNTER — Other Ambulatory Visit: Payer: Self-pay | Admitting: Physician Assistant

## 2022-10-06 DIAGNOSIS — G43009 Migraine without aura, not intractable, without status migrainosus: Secondary | ICD-10-CM

## 2022-10-17 ENCOUNTER — Other Ambulatory Visit: Payer: Self-pay | Admitting: Physician Assistant

## 2022-10-17 DIAGNOSIS — E1165 Type 2 diabetes mellitus with hyperglycemia: Secondary | ICD-10-CM

## 2022-11-13 ENCOUNTER — Encounter: Payer: Self-pay | Admitting: Physician Assistant

## 2022-11-13 ENCOUNTER — Ambulatory Visit (INDEPENDENT_AMBULATORY_CARE_PROVIDER_SITE_OTHER): Payer: BC Managed Care – PPO | Admitting: Physician Assistant

## 2022-11-13 VITALS — BP 130/64 | HR 77 | Ht 66.0 in | Wt 317.0 lb

## 2022-11-13 DIAGNOSIS — E1169 Type 2 diabetes mellitus with other specified complication: Secondary | ICD-10-CM | POA: Diagnosis not present

## 2022-11-13 DIAGNOSIS — Z794 Long term (current) use of insulin: Secondary | ICD-10-CM

## 2022-11-13 DIAGNOSIS — E039 Hypothyroidism, unspecified: Secondary | ICD-10-CM | POA: Diagnosis not present

## 2022-11-13 DIAGNOSIS — E785 Hyperlipidemia, unspecified: Secondary | ICD-10-CM

## 2022-11-13 LAB — POCT UA - MICROALBUMIN
Albumin/Creatinine Ratio, Urine, POC: <30
Creatinine, POC: 100 mg/dL
Microalbumin Ur, POC: 10 mg/L

## 2022-11-13 LAB — HEMOGLOBIN A1C: Hemoglobin A1C: 6.5

## 2022-11-13 LAB — POCT GLYCOSYLATED HEMOGLOBIN (HGB A1C): Hemoglobin A1C: 6.5 % — AB (ref 4.0–5.6)

## 2022-11-13 MED ORDER — MOUNJARO 7.5 MG/0.5ML ~~LOC~~ SOAJ: 7.5000 mg | SUBCUTANEOUS | 0 refills | Status: DC

## 2022-11-13 MED ORDER — LEVOTHYROXINE SODIUM 137 MCG PO TABS: 137.0000 ug | ORAL_TABLET | Freq: Every day | ORAL | 0 refills | Status: DC

## 2022-11-13 NOTE — Progress Notes (Signed)
Established Patient Office Visit  Subjective   Patient ID: Amanda Davidson, female    DOB: 1971/04/06  Age: 52 y.o. MRN: [id number]  Chief Complaint  Patient presents with   Follow-up    HPI Pt is a 52 yo obese female with T2DM, HTN, HLD who presents to the clinic for 3 month follow up.   Last A1c was 10.3. she was started back on mounjaro and farxiga. Her morning sugars have been 100s to 110s. She feels much better. No weight loss. Tolerating mounjaro well. She is having external vaginal itching and burning with farxiga. She has stopped taking it and went away. No hypoglycemic events. No open sores or wounds. She is active but not exercising.   Patient Active Problem List   Diagnosis Date Noted   Elevated blood pressure reading 08/02/2022   Seasonal allergies 09/28/2021   Hyperlipidemia LDL goal <70 07/02/2021   S/P repair of recurrent ventral hernia 02/27/2021   Change in stool    Colon cancer screening    Polyp of sigmoid colon    Grade II internal hemorrhoids    Snoring 04/21/2018   Tonsillar mass 04/20/2018   Tonsillar enlargement 04/20/2018   Pre-diabetes 11/19/2017   Trigger finger, left ring finger 10/23/2017   Adjustment disorder with mixed anxiety and depressed mood 10/23/2017   Type 2 diabetes mellitus without complication, without long-term current use of insulin (HCC) 05/19/2017   Insulin resistance 05/24/2013   Laparoscopic sleeve gastrectomy Dec 2014 05/14/2013   Asthma, moderate persistent 09/18/2012   Class 3 severe obesity due to excess calories with serious comorbidity and body mass index (BMI) of 50.0 to 59.9 in adult (HCC) 09/18/2012   Hypertension, essential, benign 06/30/2012   OSA (obstructive sleep apnea) 05/24/2012   Hypothyroidism 05/24/2012   Migraine 05/24/2012   Vitamin D deficiency 05/24/2012   Asthma 05/24/2012   Depression with anxiety 05/24/2012   Past Medical History:  Diagnosis Date   Anemia    Asthma    Complication of  anesthesia    NAUSEA   Depression    Diabetes mellitus without complication (HCC)    Drug use    Exercise-induced asthma    GERD (gastroesophageal reflux disease)    Headache(784.0)    MIGRAINES   Hypertension    Hypothyroidism    IBS (irritable bowel syndrome)    Migraines    Obesity    Pneumonia    PONV (postoperative nausea and vomiting)    Post-nasal drip    Shortness of breath    Sleep apnea    C-PAP but doesnt use it regularly    Vitamin D deficiency    Past Surgical History:  Procedure Laterality Date   BIOPSY  05/02/2020   Procedure: BIOPSY;  Surgeon: Amanda Cleverly, DO;  Location: [facility];  Service: Gastroenterology;;   COLONOSCOPY WITH PROPOFOL N/A 05/02/2020   Procedure: COLONOSCOPY WITH PROPOFOL;  Surgeon: Amanda Cleverly, DO;  Location: [facility];  Service: Gastroenterology;  Laterality: N/A;   HERNIA REPAIR     Umbilical around 2002 and then repair 2015   LAPAROSCOPIC ASSISTED VENTRAL HERNIA REPAIR N/A 02/27/2021   Procedure: LAPAROSCOPIC ASSISTED VENTRAL HERNIA REPAIR WITH MESH;  Surgeon: Amanda Murphy, MD;  Location: [facility];  Service: General;  Laterality: N/A;   LAPAROSCOPIC GASTRIC SLEEVE RESECTION N/A 05/14/2013   Procedure: LAPAROSCOPIC GASTRIC SLEEVE RESECTION;  Surgeon: Amanda Merino, MD;  Location: [facility];  Service: General;  Laterality: N/A;   POLYPECTOMY  05/02/2020  Procedure: POLYPECTOMY;  Surgeon: Amanda Cleverly, DO;  Location: [facility];  Service: Gastroenterology;;   TONSILLECTOMY     UMBILICAL HERNIA REPAIR  05/14/2013   Procedure: HERNIA REPAIR UMBILICAL ADULT;  Surgeon: Amanda Merino, MD;  Location: [facility];  Service: General;;   UPPER GI ENDOSCOPY  05/14/2013   Procedure: UPPER GI ENDOSCOPY;  Surgeon: Amanda Merino, MD;  Location: [facility];  Service: General;;   Family History  Problem Relation Age of Onset   Hypertension Mother    Heart disease Mother    Depression Mother    Hypertension Father     Diabetes Father    Sleep apnea Father    Heart disease Maternal Grandfather    Heart attack Maternal Grandfather    Cancer Maternal Grandfather        bladder   Hyperlipidemia Other    Sleep apnea Other    Esophageal cancer Paternal Grandfather    Colon cancer Maternal Great-grandmother    Allergies  Allergen Reactions   Metformin And Related     Diarrhea/GI upset      ROS See HPI.    Objective:     BP 130/64 (BP Location: Left Arm, Patient Position: Sitting, Cuff Size: Normal)   Pulse 77   Ht 5\' 6"  (1.676 m)   Wt (!) 317 lb (143.8 kg)   SpO2 97%   BMI 51.17 kg/m  BP Readings from Last 3 Encounters:  11/13/22 130/64  08/27/22 (!) 142/60  08/02/22 (!) 168/90   Wt Readings from Last 3 Encounters:  11/13/22 (!) 317 lb (143.8 kg)  08/27/22 (!) 321 lb (145.6 kg)  08/02/22 (!) 323 lb (146.5 kg)    .Marland Kitchen Results for orders placed or performed in visit on 11/13/22  POCT HgB A1C  Result Value Ref Range   Hemoglobin A1C 6.5 (A) 4.0 - 5.6 %   HbA1c POC (<> result, manual entry)     HbA1c, POC (prediabetic range)     HbA1c, POC (controlled diabetic range)    POCT UA - Microalbumin  Result Value Ref Range   Microalbumin Ur, POC 10 mg/L   Creatinine, POC 100 mg/dL   Albumin/Creatinine Ratio, Urine, POC <30      Physical Exam Constitutional:      Appearance: Normal appearance. She is obese.  HENT:     Head: Normocephalic.  Cardiovascular:     Rate and Rhythm: Normal rate and regular rhythm.  Pulmonary:     Effort: Pulmonary effort is normal.     Breath sounds: Normal breath sounds.  Musculoskeletal:     Right lower leg: No edema.     Left lower leg: No edema.  Neurological:     Mental Status: She is alert.  Psychiatric:        Mood and Affect: Mood normal.      The 10-year ASCVD risk score (Arnett DK, et al., 2019) is: 4.6%    Assessment & Plan:  Marland KitchenMarland KitchenMelissa was seen today for follow-up.  Diagnoses and all orders for this visit:  Type 2 diabetes  mellitus with other specified complication, without long-term current use of insulin (HCC) -     POCT HgB A1C -     tirzepatide (MOUNJARO) 7.5 MG/0.5ML Pen; Inject 7.5 mg into the skin once a week. -     POCT UA - Microalbumin  Class 3 severe obesity due to excess calories with serious comorbidity and body mass index (BMI) of 50.0 to 59.9 in adult [facility])  Hypothyroidism, unspecified type -     levothyroxine (SYNTHROID) 137 MCG tablet; Take 1 tablet (137 mcg total) by mouth daily before breakfast.  Hyperlipidemia LDL goal <70   A1C much better!  Doing great Continue on mounjaro and try to add back in the farxiga after starting vaginal probiotics BP to goal On statin Normal mircoalbumin .Marland Kitchen Diabetic Foot Exam - Simple   Simple Foot Form Diabetic Foot exam was performed with the following findings: Yes 11/13/2022 11:11 AM  Visual Inspection No deformities, no ulcerations, no other skin breakdown bilaterally: Yes Sensation Testing Intact to touch and monofilament testing bilaterally: Yes Pulse Check Posterior Tibialis and Dorsalis pulse intact bilaterally: Yes Comments    Needs eye exam Follow up in 3 months  Refilled thyroid medication Labs in 3 months     Return in about 3 months (around 02/13/2023).    Tandy Gaw, PA-C

## 2022-11-27 ENCOUNTER — Ambulatory Visit: Payer: BC Managed Care – PPO | Admitting: Physician Assistant

## 2022-12-15 ENCOUNTER — Other Ambulatory Visit: Payer: Self-pay | Admitting: Physician Assistant

## 2022-12-15 DIAGNOSIS — E039 Hypothyroidism, unspecified: Secondary | ICD-10-CM

## 2023-01-22 DIAGNOSIS — D235 Other benign neoplasm of skin of trunk: Secondary | ICD-10-CM | POA: Diagnosis not present

## 2023-01-22 DIAGNOSIS — D485 Neoplasm of uncertain behavior of skin: Secondary | ICD-10-CM | POA: Diagnosis not present

## 2023-01-22 DIAGNOSIS — L821 Other seborrheic keratosis: Secondary | ICD-10-CM | POA: Diagnosis not present

## 2023-01-22 DIAGNOSIS — D225 Melanocytic nevi of trunk: Secondary | ICD-10-CM | POA: Diagnosis not present

## 2023-01-22 DIAGNOSIS — L814 Other melanin hyperpigmentation: Secondary | ICD-10-CM | POA: Diagnosis not present

## 2023-02-10 ENCOUNTER — Encounter: Payer: Self-pay | Admitting: Physician Assistant

## 2023-02-10 DIAGNOSIS — E1169 Type 2 diabetes mellitus with other specified complication: Secondary | ICD-10-CM

## 2023-02-10 MED ORDER — MOUNJARO 7.5 MG/0.5ML ~~LOC~~ SOAJ
7.5000 mg | SUBCUTANEOUS | 0 refills | Status: DC
Start: 2023-02-10 — End: 2023-02-10

## 2023-02-10 MED ORDER — MOUNJARO 7.5 MG/0.5ML ~~LOC~~ SOAJ
7.5000 mg | SUBCUTANEOUS | 0 refills | Status: DC
Start: 2023-02-10 — End: 2023-02-14

## 2023-02-12 ENCOUNTER — Other Ambulatory Visit: Payer: Self-pay | Admitting: Physician Assistant

## 2023-02-12 DIAGNOSIS — J4540 Moderate persistent asthma, uncomplicated: Secondary | ICD-10-CM

## 2023-02-14 ENCOUNTER — Ambulatory Visit (INDEPENDENT_AMBULATORY_CARE_PROVIDER_SITE_OTHER): Payer: BC Managed Care – PPO | Admitting: Physician Assistant

## 2023-02-14 ENCOUNTER — Encounter: Payer: Self-pay | Admitting: Physician Assistant

## 2023-02-14 VITALS — BP 139/70 | HR 72 | Ht 66.0 in | Wt 299.0 lb

## 2023-02-14 DIAGNOSIS — E1169 Type 2 diabetes mellitus with other specified complication: Secondary | ICD-10-CM

## 2023-02-14 DIAGNOSIS — Z6841 Body Mass Index (BMI) 40.0 and over, adult: Secondary | ICD-10-CM

## 2023-02-14 DIAGNOSIS — E785 Hyperlipidemia, unspecified: Secondary | ICD-10-CM | POA: Diagnosis not present

## 2023-02-14 DIAGNOSIS — Z23 Encounter for immunization: Secondary | ICD-10-CM | POA: Diagnosis not present

## 2023-02-14 DIAGNOSIS — E039 Hypothyroidism, unspecified: Secondary | ICD-10-CM

## 2023-02-14 DIAGNOSIS — Z7985 Long-term (current) use of injectable non-insulin antidiabetic drugs: Secondary | ICD-10-CM

## 2023-02-14 LAB — POCT GLYCOSYLATED HEMOGLOBIN (HGB A1C): Hemoglobin A1C: 6.4 % — AB (ref 4.0–5.6)

## 2023-02-14 MED ORDER — MOUNJARO 10 MG/0.5ML ~~LOC~~ SOAJ
10.0000 mg | SUBCUTANEOUS | 0 refills | Status: DC
Start: 2023-02-14 — End: 2023-05-16

## 2023-02-14 MED ORDER — LEVOTHYROXINE SODIUM 137 MCG PO TABS: 137.0000 ug | ORAL_TABLET | Freq: Every day | ORAL | 1 refills | Status: DC

## 2023-02-14 NOTE — Patient Instructions (Signed)
Increased mounjaro to 10mg .  Start walking a week.

## 2023-02-14 NOTE — Progress Notes (Signed)
Established Patient Office Visit  Subjective   Patient ID: Amanda Davidson, female    DOB: 02-15-1971  Age: 52 y.o. MRN: [id number]  Chief Complaint  Patient presents with   Medical Management of Chronic Issues    3 mo fup d/m last A1c 6.5 11/13/22    HPI Pt is a 52 yo obese female with T2DM, HTN, HLD, Hypothyroidism who presents to the clinic for follow up.   She is not checking her sugars. She is not exercising. She has lost 24lbs since starting mounjaro in march. No open sores or wounds. She is tolerating mounjaro well. She is compliant with medications. She denies any CP, palpitations, headaches or vision changes.   .. Active Ambulatory Problems    Diagnosis Date Noted   OSA (obstructive sleep apnea) 05/24/2012   Hypothyroidism 05/24/2012   Migraine 05/24/2012   Vitamin D deficiency 05/24/2012   Asthma 05/24/2012   Depression with anxiety 05/24/2012   Hypertension, essential, benign 06/30/2012   Asthma, moderate persistent 09/18/2012   Class 3 severe obesity due to excess calories with serious comorbidity and body mass index (BMI) of 50.0 to 59.9 in adult [facility]) 09/18/2012   Laparoscopic sleeve gastrectomy Dec 2014 05/14/2013   Insulin resistance 05/24/2013   Type 2 diabetes mellitus without complication, without long-term current use of insulin (HCC) 05/19/2017   Trigger finger, left ring finger 10/23/2017   Adjustment disorder with mixed anxiety and depressed mood 10/23/2017   Pre-diabetes 11/19/2017   Tonsillar mass 04/20/2018   Tonsillar enlargement 04/20/2018   Snoring 04/21/2018   Change in stool    Colon cancer screening    Polyp of sigmoid colon    Grade II internal hemorrhoids    S/P repair of recurrent ventral hernia 02/27/2021   Hyperlipidemia LDL goal <70 07/02/2021   Seasonal allergies 09/28/2021   Elevated blood pressure reading 08/02/2022   Resolved Ambulatory Problems    Diagnosis Date Noted   Diabetes mellitus, type 2 (HCC) 09/18/2012   Past  Medical History:  Diagnosis Date   Anemia    Complication of anesthesia    Depression    Diabetes mellitus without complication (HCC)    Drug use    Exercise-induced asthma    GERD (gastroesophageal reflux disease)    Headache(784.0)    Hypertension    IBS (irritable bowel syndrome)    Migraines    Obesity    Pneumonia    PONV (postoperative nausea and vomiting)    Post-nasal drip    Shortness of breath    Sleep apnea      Review of Systems  All other systems reviewed and are negative.     Objective:     BP 139/70   Pulse 72   Ht 5\' 6"  (1.676 m)   Wt 299 lb (135.6 kg)   SpO2 99%   BMI 48.26 kg/m  BP Readings from Last 3 Encounters:  02/14/23 139/70  11/13/22 130/64  08/27/22 (!) 142/60   Wt Readings from Last 3 Encounters:  02/14/23 299 lb (135.6 kg)  11/13/22 (!) 317 lb (143.8 kg)  08/27/22 (!) 321 lb (145.6 kg)    .Marland Kitchen Results for orders placed or performed in visit on 02/14/23  POCT HgB A1C  Result Value Ref Range   Hemoglobin A1C 6.4 (A) 4.0 - 5.6 %   HbA1c POC (<> result, manual entry)     HbA1c, POC (prediabetic range)     HbA1c, POC (controlled diabetic range)  Physical Exam Constitutional:      Appearance: Normal appearance. She is obese.  HENT:     Head: Normocephalic.  Cardiovascular:     Rate and Rhythm: Normal rate and regular rhythm.     Pulses: Normal pulses.     Heart sounds: Normal heart sounds.  Pulmonary:     Effort: Pulmonary effort is normal.     Breath sounds: Normal breath sounds.  Musculoskeletal:     Right lower leg: No edema.     Left lower leg: No edema.  Neurological:     General: No focal deficit present.     Mental Status: She is alert and oriented to person, place, and time.  Psychiatric:        Mood and Affect: Mood normal.      The 10-year ASCVD risk score (Arnett DK, et al., 2019) is: 5.2%    Assessment & Plan:  Marland KitchenMarland KitchenMelissa was seen today for medical management of chronic issues.  Diagnoses and  all orders for this visit:  Type 2 diabetes mellitus with other specified complication, without long-term current use of insulin (HCC) -     Ambulatory referral to Ophthalmology -     CMP14+EGFR -     tirzepatide [facility]) 10 MG/0.5ML Pen; Inject 10 mg into the skin once a week. -     POCT HgB A1C  Hyperlipidemia LDL goal <70 -     Lipid panel -     CMP14+EGFR  Hypothyroidism, unspecified type -     levothyroxine (SYNTHROID) 137 MCG tablet; Take 1 tablet (137 mcg total) by mouth daily before breakfast.  Class 3 severe obesity due to excess calories with serious comorbidity and body mass index (BMI) of 45.0 to 49.9 in adult (HCC) -     tirzepatide (MOUNJARO) 10 MG/0.5ML Pen; Inject 10 mg into the skin once a week.  Need for shingles vaccine -     Zoster Recombinant (Shingrix )   A1C to goal  Increased mounjaro to help more with weight loss Encouraged to add exercise in  BP close to goal continue to work on weight loss On statin, recheck lipid today Foot UTD Eye exam referral placed Declined covid and flu Shingles 2nd booster given Follow up in 3 months    Tandy Gaw, PA-C

## 2023-02-15 LAB — CMP14+EGFR
ALT: 35 IU/L — ABNORMAL HIGH (ref 0–32)
AST: 25 IU/L (ref 0–40)
Albumin: 4 g/dL (ref 3.8–4.9)
Alkaline Phosphatase: 105 IU/L (ref 44–121)
BUN/Creatinine Ratio: 18 (ref 9–23)
BUN: 13 mg/dL (ref 6–24)
Bilirubin Total: 0.6 mg/dL (ref 0.0–1.2)
CO2: 25 mmol/L (ref 20–29)
Calcium: 9.9 mg/dL (ref 8.7–10.2)
Chloride: 100 mmol/L (ref 96–106)
Creatinine, Ser: 0.72 mg/dL (ref 0.57–1.00)
Globulin, Total: 2.6 g/dL (ref 1.5–4.5)
Glucose: 91 mg/dL (ref 70–99)
Potassium: 4.5 mmol/L (ref 3.5–5.2)
Sodium: 139 mmol/L (ref 134–144)
Total Protein: 6.6 g/dL (ref 6.0–8.5)
eGFR: 100 mL/min/1.73

## 2023-02-15 LAB — LIPID PANEL
Chol/HDL Ratio: 3.7 ratio (ref 0.0–4.4)
Cholesterol, Total: 144 mg/dL (ref 100–199)
HDL: 39 mg/dL — ABNORMAL LOW
LDL Chol Calc (NIH): 86 mg/dL (ref 0–99)
Triglycerides: 100 mg/dL (ref 0–149)
VLDL Cholesterol Cal: 19 mg/dL (ref 5–40)

## 2023-02-17 NOTE — Progress Notes (Signed)
Cholesterol has improved but LDL not to goal of under 70. Are you ok with increasing lipitor to 80mg  to get to goal?   Liver enzymes improving with weight loss. Almost in normal range!

## 2023-03-11 ENCOUNTER — Telehealth: Payer: Self-pay | Admitting: Physician Assistant

## 2023-03-11 NOTE — Telephone Encounter (Signed)
Patient called and wanted to know if you would be open to seeing her mom she is a geratic patient

## 2023-03-12 DIAGNOSIS — E119 Type 2 diabetes mellitus without complications: Secondary | ICD-10-CM | POA: Diagnosis not present

## 2023-03-12 LAB — HM DIABETES EYE EXAM

## 2023-03-14 ENCOUNTER — Encounter: Payer: Self-pay | Admitting: Physician Assistant

## 2023-05-16 ENCOUNTER — Ambulatory Visit (INDEPENDENT_AMBULATORY_CARE_PROVIDER_SITE_OTHER): Payer: BC Managed Care – PPO | Admitting: Physician Assistant

## 2023-05-16 ENCOUNTER — Encounter: Payer: Self-pay | Admitting: Physician Assistant

## 2023-05-16 VITALS — BP 134/78 | HR 75 | Resp 12 | Ht 66.0 in | Wt 276.3 lb

## 2023-05-16 DIAGNOSIS — E66813 Obesity, class 3: Secondary | ICD-10-CM

## 2023-05-16 DIAGNOSIS — Z6841 Body Mass Index (BMI) 40.0 and over, adult: Secondary | ICD-10-CM

## 2023-05-16 DIAGNOSIS — E039 Hypothyroidism, unspecified: Secondary | ICD-10-CM

## 2023-05-16 DIAGNOSIS — Z1231 Encounter for screening mammogram for malignant neoplasm of breast: Secondary | ICD-10-CM

## 2023-05-16 DIAGNOSIS — E1169 Type 2 diabetes mellitus with other specified complication: Secondary | ICD-10-CM | POA: Diagnosis not present

## 2023-05-16 DIAGNOSIS — E785 Hyperlipidemia, unspecified: Secondary | ICD-10-CM

## 2023-05-16 LAB — POCT GLYCOSYLATED HEMOGLOBIN (HGB A1C): Hemoglobin A1C: 5.6 % (ref 4.0–5.6)

## 2023-05-16 MED ORDER — MOUNJARO 10 MG/0.5ML ~~LOC~~ SOAJ: 10.0000 mg | SUBCUTANEOUS | 0 refills | Status: DC

## 2023-05-19 ENCOUNTER — Encounter: Payer: Self-pay | Admitting: Physician Assistant

## 2023-05-19 ENCOUNTER — Other Ambulatory Visit: Payer: Self-pay | Admitting: Physician Assistant

## 2023-05-19 DIAGNOSIS — J4540 Moderate persistent asthma, uncomplicated: Secondary | ICD-10-CM

## 2023-05-19 NOTE — Progress Notes (Signed)
Established Patient Office Visit  Subjective   Patient ID: Amanda Davidson, female    DOB: Jan 26, 1971  Age: 52 y.o. MRN: [id number]  Chief Complaint  Patient presents with   Diabetes    HPI Pt is a 52 yo obese female with T2DM, HTN, HLD who presents to the clinic for medication follow up.   Pt is doing well. She is not checking her sugars. She is compliant with medications. No side effects. She is up to 10mg  of mounjaro. Lost 24lbs in the last 3 months. She is not exercising. Denies any CP, palpitations, headaches or vision changes.      Active Ambulatory Problems    Diagnosis Date Noted   OSA (obstructive sleep apnea) 05/24/2012   Hypothyroidism 05/24/2012   Migraine 05/24/2012   Vitamin D deficiency 05/24/2012   Asthma 05/24/2012   Depression with anxiety 05/24/2012   Hypertension, essential, benign 06/30/2012   Asthma, moderate persistent 09/18/2012   Class 3 severe obesity due to excess calories with serious comorbidity and body mass index (BMI) of 40.0 to 44.9 in adult [facility]) 09/18/2012   Laparoscopic sleeve gastrectomy Dec 2014 05/14/2013   Insulin resistance 05/24/2013   Type 2 diabetes mellitus without complication, without long-term current use of insulin (HCC) 05/19/2017   Trigger finger, left ring finger 10/23/2017   Adjustment disorder with mixed anxiety and depressed mood 10/23/2017   Pre-diabetes 11/19/2017   Tonsillar mass 04/20/2018   Tonsillar enlargement 04/20/2018   Snoring 04/21/2018   Change in stool    Colon cancer screening    Polyp of sigmoid colon    Grade II internal hemorrhoids    S/P repair of recurrent ventral hernia 02/27/2021   Hyperlipidemia LDL goal <70 07/02/2021   Seasonal allergies 09/28/2021   Elevated blood pressure reading 08/02/2022   Resolved Ambulatory Problems    Diagnosis Date Noted   Diabetes mellitus, type 2 (HCC) 09/18/2012   Past Medical History:  Diagnosis Date   Anemia    Complication of anesthesia     Depression    Diabetes mellitus without complication (HCC)    Drug use    Exercise-induced asthma    GERD (gastroesophageal reflux disease)    Headache(784.0)    Hypertension    IBS (irritable bowel syndrome)    Migraines    Obesity    Pneumonia    PONV (postoperative nausea and vomiting)    Post-nasal drip    Shortness of breath    Sleep apnea      ROS See HPI.    Objective:     BP 134/78 (BP Location: Left Arm, Patient Position: Sitting)   Pulse 75   Resp 12   Ht 5\' 6"  (1.676 m)   Wt 276 lb 4.8 oz (125.3 kg)   SpO2 100%   BMI 44.60 kg/m  BP Readings from Last 3 Encounters:  05/16/23 134/78  02/14/23 139/70  11/13/22 130/64   Wt Readings from Last 3 Encounters:  05/16/23 276 lb 4.8 oz (125.3 kg)  02/14/23 299 lb (135.6 kg)  11/13/22 (!) 317 lb (143.8 kg)      Physical Exam Constitutional:      Appearance: Normal appearance. She is obese.  HENT:     Head: Normocephalic.  Cardiovascular:     Rate and Rhythm: Normal rate and regular rhythm.  Pulmonary:     Effort: Pulmonary effort is normal.     Breath sounds: Normal breath sounds.  Musculoskeletal:     Right lower leg: No  edema.     Left lower leg: No edema.  Neurological:     General: No focal deficit present.     Mental Status: She is alert and oriented to person, place, and time.  Psychiatric:        Mood and Affect: Mood normal.      Results for orders placed or performed in visit on 05/16/23  POCT HgB A1C  Result Value Ref Range   Hemoglobin A1C 5.6 4.0 - 5.6 %   HbA1c POC (<> result, manual entry)     HbA1c, POC (prediabetic range)     HbA1c, POC (controlled diabetic range)        The 10-year ASCVD risk score (Arnett DK, et al., 2019) is: 3.7%    Assessment & Plan:  Marland KitchenMarland KitchenMelissa was seen today for diabetes.  Diagnoses and all orders for this visit:  Type 2 diabetes mellitus with other specified complication, without long-term current use of insulin (HCC) -     tirzepatide  (MOUNJARO) 10 MG/0.5ML Pen; Inject 10 mg into the skin once a week. -     POCT HgB A1C  Hyperlipidemia LDL goal <70  Hypothyroidism, unspecified type  Class 3 severe obesity due to excess calories with serious comorbidity and body mass index (BMI) of 40.0 to 44.9 in adult (HCC) -     tirzepatide (MOUNJARO) 10 MG/0.5ML Pen; Inject 10 mg into the skin once a week.  Visit for screening mammogram -     MM 3D SCREENING MAMMOGRAM BILATERAL BREAST; Future   A1C to goal Down 24lbs in 3 months Continue at same dose Add more exercise and strength training in BP to goal On statin Flu and covid declined Follow up in 3 months  Mammogram ordered.    Return in about 3 months (around 08/14/2023).    Tandy Gaw, PA-C

## 2023-05-22 ENCOUNTER — Other Ambulatory Visit: Payer: Self-pay | Admitting: Physician Assistant

## 2023-05-22 DIAGNOSIS — J4540 Moderate persistent asthma, uncomplicated: Secondary | ICD-10-CM

## 2023-06-13 ENCOUNTER — Other Ambulatory Visit: Payer: Self-pay | Admitting: Physician Assistant

## 2023-06-13 DIAGNOSIS — E1165 Type 2 diabetes mellitus with hyperglycemia: Secondary | ICD-10-CM

## 2023-06-25 ENCOUNTER — Other Ambulatory Visit: Payer: Self-pay | Admitting: Physician Assistant

## 2023-06-25 DIAGNOSIS — G43009 Migraine without aura, not intractable, without status migrainosus: Secondary | ICD-10-CM

## 2023-07-09 ENCOUNTER — Ambulatory Visit: Payer: BC Managed Care – PPO

## 2023-07-09 DIAGNOSIS — Z1231 Encounter for screening mammogram for malignant neoplasm of breast: Secondary | ICD-10-CM

## 2023-07-15 ENCOUNTER — Encounter: Payer: Self-pay | Admitting: Physician Assistant

## 2023-07-15 NOTE — Progress Notes (Signed)
Normal mammogram. Follow up in 1 year.

## 2023-08-12 NOTE — Progress Notes (Signed)
 Established Patient Office Visit  Subjective   Patient ID: Amanda Davidson, female    DOB: Sep 19, 1970  Age: 53 y.o. MRN: [id number]  CC: 3 month follow up   HPI Patient is a 53 yo female with T2DM, HTN, HLD who presents for a 3 month follow up.   Pt is doing well. She is not checking her sugars. She is compliant with medications. No side effects. She is up to 10mg  of mounjaro. Lost 24lbs in the last 3 months. She is not exercising. Denies any CP, palpitations, headaches or vision changes.   She had one concerning episode of right chest pain and heart rate increase while she was laying down. Not happened again. Her watch said HR was 60-160. She has just got over URI symptoms but no concerns today with cough/sob.     Active Ambulatory Problems    Diagnosis Date Noted   OSA (obstructive sleep apnea) 05/24/2012   Hypothyroidism 05/24/2012   Migraine 05/24/2012   Vitamin D deficiency 05/24/2012   Asthma 05/24/2012   Depression with anxiety 05/24/2012   Hypertension, essential, benign 06/30/2012   Asthma, moderate persistent 09/18/2012   Class 3 severe obesity due to excess calories with serious comorbidity and body mass index (BMI) of 40.0 to 44.9 in adult [facility]) 09/18/2012   Laparoscopic sleeve gastrectomy Dec 2014 05/14/2013   Insulin resistance 05/24/2013   Type 2 diabetes mellitus without complication, without long-term current use of insulin (HCC) 05/19/2017   Trigger finger, left ring finger 10/23/2017   Adjustment disorder with mixed anxiety and depressed mood 10/23/2017   Pre-diabetes 11/19/2017   Tonsillar mass 04/20/2018   Tonsillar enlargement 04/20/2018   Snoring 04/21/2018   Change in stool    Colon cancer screening    Polyp of sigmoid colon    Grade II internal hemorrhoids    S/P repair of recurrent ventral hernia 02/27/2021   Hyperlipidemia LDL goal <70 07/02/2021   Seasonal allergies 09/28/2021   Elevated blood pressure reading 08/02/2022   Increased heart  rate 08/13/2023   Right-sided chest pain 08/13/2023   Resolved Ambulatory Problems    Diagnosis Date Noted   Diabetes mellitus, type 2 (HCC) 09/18/2012   Past Medical History:  Diagnosis Date   Anemia    Complication of anesthesia    Depression    Diabetes mellitus without complication (HCC)    Drug use    Exercise-induced asthma    GERD (gastroesophageal reflux disease)    Headache(784.0)    Hypertension    IBS (irritable bowel syndrome)    Migraines    Obesity    Pneumonia    PONV (postoperative nausea and vomiting)    Post-nasal drip    Shortness of breath    Sleep apnea     ROS See HPI   Objective:    BP 113/73 (BP Location: Left Arm, Patient Position: Sitting, Cuff Size: Large)   Pulse 71   Ht 5\' 6"  (1.676 m)   Wt 267 lb 4 oz (121.2 kg)   SpO2 99%   BMI 43.14 kg/m  BP Readings from Last 3 Encounters:  08/13/23 113/73  05/16/23 134/78  02/14/23 139/70   Wt Readings from Last 3 Encounters:  08/13/23 267 lb 4 oz (121.2 kg)  05/16/23 276 lb 4.8 oz (125.3 kg)  02/14/23 299 lb (135.6 kg)   SpO2 Readings from Last 3 Encounters:  08/13/23 99%  05/16/23 100%  02/14/23 99%      Physical Exam Constitutional:  Appearance: Normal appearance.  HENT:     Head: Normocephalic and atraumatic.  Cardiovascular:     Rate and Rhythm: Normal rate and regular rhythm.     Pulses: Normal pulses.     Heart sounds: Normal heart sounds.  Pulmonary:     Effort: Pulmonary effort is normal.     Breath sounds: Normal breath sounds.  Musculoskeletal:        General: Normal range of motion.  Skin:    General: Skin is warm.  Neurological:     Mental Status: She is alert.  Psychiatric:        Mood and Affect: Mood normal.    Assessment & Plan:  Marland KitchenMarland KitchenMelissa was seen today for medical management of chronic issues.  Diagnoses and all orders for this visit:  Type 2 diabetes mellitus with other specified complication, without long-term current use of insulin (HCC) -      Hemoglobin A1c -     tirzepatide (MOUNJARO) 10 MG/0.5ML Pen; Inject 10 mg into the skin once a week. -     CMP14+EGFR  Class 3 severe obesity due to excess calories with serious comorbidity and body mass index (BMI) of 40.0 to 44.9 in adult (HCC) -     tirzepatide (MOUNJARO) 10 MG/0.5ML Pen; Inject 10 mg into the skin once a week.  Hyperlipidemia LDL goal <70 -     atorvastatin (LIPITOR) 40 MG tablet; Take 1 tablet (40 mg total) by mouth daily. -     CMP14+EGFR  Right-sided chest pain -     TSH + free T4 -     CMP14+EGFR -     Magnesium  Increased heart rate -     TSH + free T4 -     CMP14+EGFR -     Magnesium  Acquired hypothyroidism -     TSH + free T4    Too soon to check A1C will order in labs Vitals look great today Continue on same medications On statin Declined pneumonia and covid vaccine today  EKG done today:  NSR at 69 Non specific ST findings but unchanged when compared to last EKG This event happened once if happens again let me know and will consider more work up  If continues will consider ziopatch Labs ordered today to make sure electrolytes/thyroid/hemoglobin to goal     Tandy Gaw, PA-C

## 2023-08-13 ENCOUNTER — Ambulatory Visit (INDEPENDENT_AMBULATORY_CARE_PROVIDER_SITE_OTHER): Payer: BC Managed Care – PPO | Admitting: Physician Assistant

## 2023-08-13 VITALS — BP 113/73 | HR 71 | Ht 66.0 in | Wt 267.2 lb

## 2023-08-13 DIAGNOSIS — E66813 Obesity, class 3: Secondary | ICD-10-CM

## 2023-08-13 DIAGNOSIS — E1169 Type 2 diabetes mellitus with other specified complication: Secondary | ICD-10-CM | POA: Diagnosis not present

## 2023-08-13 DIAGNOSIS — E039 Hypothyroidism, unspecified: Secondary | ICD-10-CM

## 2023-08-13 DIAGNOSIS — Z6841 Body Mass Index (BMI) 40.0 and over, adult: Secondary | ICD-10-CM

## 2023-08-13 DIAGNOSIS — R000 Tachycardia, unspecified: Secondary | ICD-10-CM

## 2023-08-13 DIAGNOSIS — E785 Hyperlipidemia, unspecified: Secondary | ICD-10-CM

## 2023-08-13 DIAGNOSIS — R079 Chest pain, unspecified: Secondary | ICD-10-CM | POA: Diagnosis not present

## 2023-08-13 DIAGNOSIS — Z7985 Long-term (current) use of injectable non-insulin antidiabetic drugs: Secondary | ICD-10-CM

## 2023-08-13 MED ORDER — ATORVASTATIN CALCIUM 40 MG PO TABS: 40.0000 mg | ORAL_TABLET | Freq: Every day | ORAL | 3 refills | Status: DC

## 2023-08-13 MED ORDER — MOUNJARO 10 MG/0.5ML ~~LOC~~ SOAJ: 10.0000 mg | SUBCUTANEOUS | 0 refills | Status: DC

## 2023-08-18 ENCOUNTER — Encounter: Payer: Self-pay | Admitting: Physician Assistant

## 2023-08-19 DIAGNOSIS — R079 Chest pain, unspecified: Secondary | ICD-10-CM | POA: Diagnosis not present

## 2023-08-19 DIAGNOSIS — R000 Tachycardia, unspecified: Secondary | ICD-10-CM | POA: Diagnosis not present

## 2023-08-19 DIAGNOSIS — E785 Hyperlipidemia, unspecified: Secondary | ICD-10-CM | POA: Diagnosis not present

## 2023-08-19 DIAGNOSIS — E1169 Type 2 diabetes mellitus with other specified complication: Secondary | ICD-10-CM | POA: Diagnosis not present

## 2023-08-19 DIAGNOSIS — E039 Hypothyroidism, unspecified: Secondary | ICD-10-CM | POA: Diagnosis not present

## 2023-08-20 ENCOUNTER — Encounter: Payer: Self-pay | Admitting: Physician Assistant

## 2023-08-20 LAB — CMP14+EGFR
ALT: 14 IU/L (ref 0–32)
AST: 12 IU/L (ref 0–40)
Albumin: 3.8 g/dL (ref 3.8–4.9)
Alkaline Phosphatase: 116 IU/L (ref 44–121)
BUN/Creatinine Ratio: 18 (ref 9–23)
BUN: 13 mg/dL (ref 6–24)
Bilirubin Total: 0.4 mg/dL (ref 0.0–1.2)
CO2: 25 mmol/L (ref 20–29)
Calcium: 9.5 mg/dL (ref 8.7–10.2)
Chloride: 101 mmol/L (ref 96–106)
Creatinine, Ser: 0.74 mg/dL (ref 0.57–1.00)
Globulin, Total: 2.5 g/dL (ref 1.5–4.5)
Glucose: 99 mg/dL (ref 70–99)
Potassium: 4.6 mmol/L (ref 3.5–5.2)
Sodium: 139 mmol/L (ref 134–144)
Total Protein: 6.3 g/dL (ref 6.0–8.5)
eGFR: 96 mL/min/{1.73_m2} (ref >59)

## 2023-08-20 LAB — HEMOGLOBIN A1C
Est. average glucose Bld gHb Est-mCnc: 114 mg/dL
Hgb A1c MFr Bld: 5.6 % (ref 4.8–5.6)

## 2023-08-20 LAB — TSH+FREE T4
Free T4: 1.49 ng/dL (ref 0.82–1.77)
TSH: 0.869 u[IU]/mL (ref 0.450–4.500)

## 2023-08-20 LAB — MAGNESIUM: Magnesium: 2.3 mg/dL (ref 1.6–2.3)

## 2023-08-20 NOTE — Progress Notes (Signed)
Magnesium normal

## 2023-08-20 NOTE — Progress Notes (Signed)
 Amanda Davidson,   A1C to goal! Stable from 3 months ago.  Thyroid normal.

## 2023-08-22 DIAGNOSIS — K029 Dental caries, unspecified: Secondary | ICD-10-CM | POA: Diagnosis not present

## 2023-08-28 NOTE — Addendum Note (Signed)
 Addended by: Chalmers Cater on: 08/28/2023 02:15 PM   Modules accepted: Orders

## 2023-10-14 ENCOUNTER — Other Ambulatory Visit: Payer: Self-pay | Admitting: Physician Assistant

## 2023-10-14 DIAGNOSIS — J4540 Moderate persistent asthma, uncomplicated: Secondary | ICD-10-CM

## 2023-11-14 ENCOUNTER — Ambulatory Visit: Admitting: Physician Assistant

## 2023-11-21 ENCOUNTER — Ambulatory Visit: Admitting: Physician Assistant

## 2023-11-21 VITALS — BP 141/69 | HR 74 | Ht 66.0 in | Wt 270.0 lb

## 2023-11-21 DIAGNOSIS — Z7985 Long-term (current) use of injectable non-insulin antidiabetic drugs: Secondary | ICD-10-CM

## 2023-11-21 DIAGNOSIS — E66813 Obesity, class 3: Secondary | ICD-10-CM

## 2023-11-21 DIAGNOSIS — Z6841 Body Mass Index (BMI) 40.0 and over, adult: Secondary | ICD-10-CM

## 2023-11-21 DIAGNOSIS — E1169 Type 2 diabetes mellitus with other specified complication: Secondary | ICD-10-CM

## 2023-11-21 DIAGNOSIS — E785 Hyperlipidemia, unspecified: Secondary | ICD-10-CM

## 2023-11-21 DIAGNOSIS — E039 Hypothyroidism, unspecified: Secondary | ICD-10-CM | POA: Diagnosis not present

## 2023-11-21 LAB — POCT GLYCOSYLATED HEMOGLOBIN (HGB A1C): Hemoglobin A1C: 5.5 % (ref 4.0–5.6)

## 2023-11-21 NOTE — Progress Notes (Unsigned)
   Established Patient Office Visit  Subjective   Patient ID: Amanda Davidson, female    DOB: 1971/01/14  Age: 53 y.o. MRN: [id number]  Chief Complaint  Patient presents with   Medical Management of Chronic Issues    Last A1c 5.6    HPI  {History (Optional):23778}  ROS    Objective:     BP (!) 141/69   Pulse 74   Ht 5' 6 (1.676 m)   Wt 270 lb (122.5 kg)   SpO2 99%   BMI 43.58 kg/m  {Vitals History (Optional):23777}  Physical Exam   ..Emoni was seen today for medical management of chronic issues.  Diagnoses and all orders for this visit:  Type 2 diabetes mellitus with other specified complication, without long-term current use of insulin  (HCC) -     POCT HgB A1C  Class 3 severe obesity due to excess calories with serious comorbidity and body mass index (BMI) of 40.0 to 44.9 in adult  Hyperlipidemia LDL goal <70  Acquired hypothyroidism  Hypothyroidism, unspecified type     The 10-year ASCVD risk score (Arnett DK, et al., 2019) is: 3.9%    Assessment & Plan:   Problem List Items Addressed This Visit       Unprioritized   Hypothyroidism   Class 3 severe obesity due to excess calories with serious comorbidity and body mass index (BMI) of 40.0 to 44.9 in adult   Hyperlipidemia LDL goal <70   Other Visit Diagnoses       Type 2 diabetes mellitus with other specified complication, without long-term current use of insulin  (HCC)    -  Primary   Relevant Orders   POCT HgB A1C       Return in about 3 months (around 02/21/2024).    Rendi Mapel, PA-C

## 2023-11-23 ENCOUNTER — Encounter: Payer: Self-pay | Admitting: Physician Assistant

## 2023-11-24 ENCOUNTER — Other Ambulatory Visit: Payer: Self-pay | Admitting: Physician Assistant

## 2023-11-24 ENCOUNTER — Encounter: Payer: Self-pay | Admitting: Physician Assistant

## 2023-11-24 DIAGNOSIS — E039 Hypothyroidism, unspecified: Secondary | ICD-10-CM

## 2023-11-24 MED ORDER — MOUNJARO 10 MG/0.5ML ~~LOC~~ SOAJ: 10.0000 mg | SUBCUTANEOUS | 0 refills | Status: DC

## 2024-01-10 ENCOUNTER — Other Ambulatory Visit: Payer: Self-pay | Admitting: Physician Assistant

## 2024-01-10 DIAGNOSIS — G43009 Migraine without aura, not intractable, without status migrainosus: Secondary | ICD-10-CM

## 2024-01-28 DIAGNOSIS — D485 Neoplasm of uncertain behavior of skin: Secondary | ICD-10-CM | POA: Diagnosis not present

## 2024-01-28 DIAGNOSIS — D225 Melanocytic nevi of trunk: Secondary | ICD-10-CM | POA: Diagnosis not present

## 2024-01-28 DIAGNOSIS — D235 Other benign neoplasm of skin of trunk: Secondary | ICD-10-CM | POA: Diagnosis not present

## 2024-01-28 DIAGNOSIS — L570 Actinic keratosis: Secondary | ICD-10-CM | POA: Diagnosis not present

## 2024-02-20 ENCOUNTER — Ambulatory Visit: Admitting: Physician Assistant

## 2024-02-23 ENCOUNTER — Other Ambulatory Visit: Payer: Self-pay | Admitting: Physician Assistant

## 2024-02-23 DIAGNOSIS — E66813 Obesity, class 3: Secondary | ICD-10-CM

## 2024-02-23 DIAGNOSIS — E1169 Type 2 diabetes mellitus with other specified complication: Secondary | ICD-10-CM

## 2024-02-25 ENCOUNTER — Encounter: Payer: Self-pay | Admitting: Physician Assistant

## 2024-02-25 ENCOUNTER — Ambulatory Visit (INDEPENDENT_AMBULATORY_CARE_PROVIDER_SITE_OTHER): Admitting: Physician Assistant

## 2024-02-25 VITALS — BP 124/68 | HR 70 | Ht 66.0 in | Wt 279.0 lb

## 2024-02-25 DIAGNOSIS — Z818 Family history of other mental and behavioral disorders: Secondary | ICD-10-CM | POA: Insufficient documentation

## 2024-02-25 DIAGNOSIS — G4733 Obstructive sleep apnea (adult) (pediatric): Secondary | ICD-10-CM

## 2024-02-25 DIAGNOSIS — E785 Hyperlipidemia, unspecified: Secondary | ICD-10-CM | POA: Diagnosis not present

## 2024-02-25 DIAGNOSIS — Z6841 Body Mass Index (BMI) 40.0 and over, adult: Secondary | ICD-10-CM

## 2024-02-25 DIAGNOSIS — E559 Vitamin D deficiency, unspecified: Secondary | ICD-10-CM

## 2024-02-25 DIAGNOSIS — E039 Hypothyroidism, unspecified: Secondary | ICD-10-CM | POA: Diagnosis not present

## 2024-02-25 DIAGNOSIS — E1169 Type 2 diabetes mellitus with other specified complication: Secondary | ICD-10-CM | POA: Diagnosis not present

## 2024-02-25 DIAGNOSIS — Z7984 Long term (current) use of oral hypoglycemic drugs: Secondary | ICD-10-CM

## 2024-02-25 DIAGNOSIS — E66813 Obesity, class 3: Secondary | ICD-10-CM | POA: Diagnosis not present

## 2024-02-25 LAB — POCT GLYCOSYLATED HEMOGLOBIN (HGB A1C): HbA1c, POC (controlled diabetic range): 5.5 % (ref 0.0–7.0)

## 2024-02-25 LAB — POCT UA - MICROALBUMIN
Albumin/Creatinine Ratio, Urine, POC: <30
Creatinine, POC: 50 mg/dL
Microalbumin Ur, POC: 10 mg/L

## 2024-02-25 NOTE — Progress Notes (Signed)
 Established Patient Office Visit  Subjective   Patient ID: Amanda Davidson, female    DOB: 06/25/1970  Age: 53 y.o. MRN: [id number]   HPI Discussed the use of AI scribe software for clinical note transcription with the patient, who gave verbal consent to proceed.  History of Present Illness Amanda Davidson is a 53 year old female who presents for a follow-up visit Diabetes.   Glycemic control and weight changes - Hemoglobin A1c remains stable at 5.5%. - Weight increased from 270 to 279 pounds, attributed to recent vacations. - Currently taking Mounjaro  for glycemic control.  Recurrent bronchitis and respiratory health - History of frequent bronchitis. - Consistent use of Singulair  has prevented bronchitis episodes. - Currently taking Singulair . - No chest pain or shortness of breath.  Cognitive health concerns - Significant family history of dementia: mother diagnosed with vascular dementia last year, maternal grandmother had dementia, paternal grandmother had Alzheimer's disease. - Concerned about personal risk for developing dementia. - not noticed any personal changes in memory, cognition.   Anxiety and cardiac symptoms - Experiences anxiety, associated with a previous episode involving her heart. - No current chest pain or shortness of breath.  Sleep quality and cpap use - Does not use CPAP machine. - Since tonsillectomy, snoring has improved and she feels rested upon waking.     ROS See HPI.    Objective:     BP 124/68 (BP Location: Left Arm, Patient Position: Sitting, Cuff Size: Large)   Pulse 70   Ht 5' 6 (1.676 m)   Wt 279 lb (126.6 kg)   SpO2 100%   BMI 45.03 kg/m  BP Readings from Last 3 Encounters:  02/25/24 124/68  11/21/23 (!) 141/69  08/13/23 113/73   Wt Readings from Last 3 Encounters:  02/25/24 279 lb (126.6 kg)  11/21/23 270 lb (122.5 kg)  08/13/23 267 lb 4 oz (121.2 kg)     .Amanda Davidson Results for orders placed or performed in visit on  02/25/24  POCT HgB A1C   Collection Time: 02/25/24  8:18 AM  Result Value Ref Range   Hemoglobin A1C     HbA1c POC (<> result, manual entry)     HbA1c, POC (prediabetic range)     HbA1c, POC (controlled diabetic range) 5.5 0.0 - 7.0 %  POCT UA - Microalbumin   Collection Time: 02/25/24  9:32 AM  Result Value Ref Range   Microalbumin Ur, POC 10 mg/L   Creatinine, POC 50 mg/dL   Albumin/Creatinine Ratio, Urine, POC <30     Physical Exam Constitutional:      Appearance: Normal appearance. She is obese.  HENT:     Head: Normocephalic.  Cardiovascular:     Rate and Rhythm: Normal rate and regular rhythm.  Pulmonary:     Effort: Pulmonary effort is normal.     Breath sounds: Normal breath sounds.  Musculoskeletal:     Right lower leg: No edema.     Left lower leg: No edema.  Neurological:     General: No focal deficit present.     Mental Status: She is alert and oriented to person, place, and time.  Psychiatric:        Mood and Affect: Mood normal.        The 10-year ASCVD risk score (Arnett DK, et al., 2019) is: 3%    Assessment & Plan:  SABRASABRAMelissa was seen today for medical management of chronic issues.  Diagnoses and all orders for this visit:  Hyperlipidemia associated with type 2 diabetes mellitus (HCC) -     POCT UA - Microalbumin -     POCT HgB A1C -     Lipid panel -     CMP14+EGFR -     TSH + free T4 -     VITAMIN D  25 Hydroxy (Vit-D Deficiency, Fractures)  Class 3 severe obesity due to excess calories with serious comorbidity and body mass index (BMI) of 40.0 to 44.9 in adult  Vitamin D  deficiency -     VITAMIN D  25 Hydroxy (Vit-D Deficiency, Fractures)  Acquired hypothyroidism -     TSH + free T4  OSA (obstructive sleep apnea)  Hyperlipidemia LDL goal <70 -     Lipid panel  Family history of dementia   Assessment and Plan Assessment & Plan Type 2 diabetes mellitus associated with hyperlipidemia and obesity Type 2 diabetes well-controlled  with A1c 5.5%. Continued management crucial to prevent complications, including cognitive decline. - Continue Tirzepatide  (Mounjaro ) 10 mg subcutaneous once a week. - BP to goal - microalbumin normal - Eye and foot exam UTD - declined flu/covid/pneumonia shot today  Class 3 severe obesity due to excess calories with serious comorbidity and BMI 40.0-44.9 Weight increased from 270 lbs to 279 lbs. Weight management important for overall health and reducing comorbid risk. - Encourage weight management strategies, including regular exercise and dietary modifications.  Hyperlipidemia Management crucial to prevent microvascular changes and reduce cognitive decline risk. - Ensure cholesterol levels are at goal to prevent microvascular changes, lipid profile ordered today.   Hypothyroidism - TSH and Free T4 ordered and will adjust medication accordingly  Obstructive sleep apnea Not using CPAP post-tonsillectomy. CPAP important for oxygenation, cognitive function, and cardiovascular health. - Discuss option of repeat sleep study to assess current CPAP need, pt declined today.  Anxiety disorder Anxiety may contribute to symptoms like palpitations.  Dementia risk management Family history of dementia and Alzheimer's. Discussed risk reduction strategies including glycemic control, cholesterol management, exercise, and mental stimulation. MOCA done today 30/30, no concerns.  - Provide handout on reducing dementia risk. - Encourage regular exercise, at least 150 minutes per week. - Encourage mental stimulation and learning new skills. - Discuss sauna use as potential preventive measure for dementia.   Return in about 3 months (around 05/26/2024).   Spent 50 minutes with patient discussing problems, symptoms, concerns and discussing dementia prevention.    Ison Wichmann, PA-C

## 2024-02-26 ENCOUNTER — Encounter: Payer: Self-pay | Admitting: Physician Assistant

## 2024-02-26 DIAGNOSIS — E1169 Type 2 diabetes mellitus with other specified complication: Secondary | ICD-10-CM

## 2024-02-26 DIAGNOSIS — Z6841 Body Mass Index (BMI) 40.0 and over, adult: Secondary | ICD-10-CM

## 2024-02-26 DIAGNOSIS — E039 Hypothyroidism, unspecified: Secondary | ICD-10-CM

## 2024-02-26 LAB — LIPID PANEL
Chol/HDL Ratio: 3.4 ratio (ref 0.0–4.4)
Cholesterol, Total: 157 mg/dL (ref 100–199)
HDL: 46 mg/dL (ref >39)
LDL Chol Calc (NIH): 80 mg/dL (ref 0–99)
Triglycerides: 181 mg/dL — ABNORMAL HIGH (ref 0–149)
VLDL Cholesterol Cal: 31 mg/dL (ref 5–40)

## 2024-02-26 LAB — CMP14+EGFR
ALT: 20 IU/L (ref 0–32)
AST: 17 IU/L (ref 0–40)
Albumin: 4.1 g/dL (ref 3.8–4.9)
Alkaline Phosphatase: 123 IU/L (ref 49–135)
BUN/Creatinine Ratio: 20 (ref 9–23)
BUN: 15 mg/dL (ref 6–24)
Bilirubin Total: 0.5 mg/dL (ref 0.0–1.2)
CO2: 24 mmol/L (ref 20–29)
Calcium: 9.9 mg/dL (ref 8.7–10.2)
Chloride: 100 mmol/L (ref 96–106)
Creatinine, Ser: 0.75 mg/dL (ref 0.57–1.00)
Globulin, Total: 2.7 g/dL (ref 1.5–4.5)
Glucose: 102 mg/dL — ABNORMAL HIGH (ref 70–99)
Potassium: 4.5 mmol/L (ref 3.5–5.2)
Sodium: 139 mmol/L (ref 134–144)
Total Protein: 6.8 g/dL (ref 6.0–8.5)
eGFR: 95 mL/min/1.73 (ref >59)

## 2024-02-26 LAB — TSH+FREE T4
Free T4: 1.42 ng/dL (ref 0.82–1.77)
TSH: 1.33 u[IU]/mL (ref 0.450–4.500)

## 2024-02-26 LAB — VITAMIN D 25 HYDROXY (VIT D DEFICIENCY, FRACTURES): Vit D, 25-Hydroxy: 26.5 ng/mL — ABNORMAL LOW (ref 30.0–100.0)

## 2024-02-26 MED ORDER — LEVOTHYROXINE SODIUM 137 MCG PO TABS: 137.0000 ug | ORAL_TABLET | Freq: Every day | ORAL | 1 refills | Status: DC

## 2024-02-26 MED ORDER — VITAMIN D (ERGOCALCIFEROL) 1.25 MG (50000 UNIT) PO CAPS: 50000.0000 [IU] | ORAL_CAPSULE | ORAL | 1 refills | Status: DC

## 2024-02-26 MED ORDER — MOUNJARO 10 MG/0.5ML ~~LOC~~ SOAJ: 10.0000 mg | SUBCUTANEOUS | 0 refills | Status: DC

## 2024-02-26 NOTE — Addendum Note (Signed)
 Addended byBETHA DUWAINE RIGGS on: 02/26/2024 01:04 PM   Modules accepted: Orders

## 2024-02-29 ENCOUNTER — Ambulatory Visit: Payer: Self-pay | Admitting: Physician Assistant

## 2024-02-29 NOTE — Progress Notes (Signed)
 [place],   Thyroid  looks good.  Kidney, liver looks good.  Vitamin D  low. Are you taking the high dose weekly?  Cholesterol looks good.  TG still a little elevated. Make sure taking a fish oil supplement daily.

## 2024-04-19 ENCOUNTER — Ambulatory Visit
Admission: RE | Admit: 2024-04-19 | Discharge: 2024-04-19 | Disposition: A | Discharge status: Home / Self Care | Attending: Family Medicine | Admitting: Family Medicine

## 2024-04-19 VITALS — BP 134/83 | HR 78 | Temp 98.8°F | Resp 19

## 2024-04-19 DIAGNOSIS — B001 Herpesviral vesicular dermatitis: Secondary | ICD-10-CM | POA: Diagnosis not present

## 2024-04-19 DIAGNOSIS — J4521 Mild intermittent asthma with (acute) exacerbation: Secondary | ICD-10-CM | POA: Diagnosis not present

## 2024-04-19 DIAGNOSIS — J069 Acute upper respiratory infection, unspecified: Secondary | ICD-10-CM

## 2024-04-19 MED ORDER — PREDNISONE 50 MG PO TABS: ORAL_TABLET | ORAL | 0 refills | Status: DC

## 2024-04-19 MED ORDER — AZITHROMYCIN 250 MG PO TABS: ORAL_TABLET | ORAL | 0 refills | Status: DC

## 2024-04-19 MED ORDER — HYDROCOD POLI-CHLORPHE POLI ER 10-8 MG/5ML PO SUER: 5.0000 mL | Freq: Two times a day (BID) | ORAL | 0 refills | Status: DC | PRN

## 2024-04-19 MED ORDER — BENZONATATE 200 MG PO CAPS: 200.0000 mg | ORAL_CAPSULE | Freq: Three times a day (TID) | ORAL | 0 refills | Status: DC | PRN

## 2024-04-19 MED ORDER — ACYCLOVIR 5 % EX OINT: 1.0000 | TOPICAL_OINTMENT | CUTANEOUS | 0 refills | Status: AC

## 2024-04-19 NOTE — Discharge Instructions (Signed)
 Drink lots of water Take the prednisone  once a day for 5 days Take tessalon  perles for daytime cough I have prescribed tussionex to take at night I have prescribed azithromycin  antibiotic in case needed.  This is likely a virus, but antibiotics are used if you fail to improve I have prescribed ointment for the lip rash

## 2024-04-19 NOTE — ED Triage Notes (Addendum)
 Pt presents with cough that started yesterday. Reports she feels like she has bronchitis. She was in [place] yesterday and had tightness in her chest when breathing. Pt is going out of town this weekend. Denies fever.   Pt would also like some medication for her fever blister present x 5 days.

## 2024-04-19 NOTE — ED Provider Notes (Signed)
 Amanda Davidson    CSN: [id number] Arrival date & time: 04/19/24  0816      History   Chief Complaint Chief Complaint  Patient presents with   Cough    Think I have bronchitis - Entered by patient    HPI Amanda Davidson is a 53 y.o. female.   Patient does have underlying asthma.  Also has seasonal allergies in the fall.  States she started with a cough yesterday.  Is short of breath.  States that she is prone to bronchitis.  Is leaving for vacation in a couple days and feels she needs treatment.  I discussed with her that most bronchitis, and most coughs are caused by viruses and that symptomatic Davidson is appropriate.  Patient states she always needs antibiotics to recover. She also has recurrent lip sores.  Has a cold sore right now that is been present for several days.    Past Medical History:  Diagnosis Date   Anemia    Asthma    Complication of anesthesia    NAUSEA   Depression    Diabetes mellitus without complication (HCC)    Drug use    Exercise-induced asthma    GERD (gastroesophageal reflux disease)    Headache(784.0)    MIGRAINES   Hypertension    Hypothyroidism    IBS (irritable bowel syndrome)    Migraines    Obesity    Pneumonia    PONV (postoperative nausea and vomiting)    Post-nasal drip    Shortness of breath    Sleep apnea    C-PAP but doesnt use it regularly    Vitamin D  deficiency     Patient Active Problem List   Diagnosis Date Noted   Family history of dementia 02/25/2024   Increased heart rate 08/13/2023   Right-sided chest pain 08/13/2023   Elevated blood pressure reading 08/02/2022   Seasonal allergies 09/28/2021   Hyperlipidemia LDL goal <70 07/02/2021   S/P repair of recurrent ventral hernia 02/27/2021   Change in stool    Colon cancer screening    Polyp of sigmoid colon    Grade II internal hemorrhoids    Snoring 04/21/2018   Tonsillar mass 04/20/2018   Tonsillar enlargement 04/20/2018   Pre-diabetes  11/19/2017   Trigger finger, left ring finger 10/23/2017   Adjustment disorder with mixed anxiety and depressed mood 10/23/2017   Diabetes mellitus (HCC) 05/19/2017   Insulin  resistance 05/24/2013   Laparoscopic sleeve gastrectomy Dec 2014 05/14/2013   Asthma, moderate persistent 09/18/2012   Class 3 severe obesity due to excess calories with serious comorbidity and body mass index (BMI) of 40.0 to 44.9 in adult (HCC) 09/18/2012   Hypertension, essential, benign 06/30/2012   OSA (obstructive sleep apnea) 05/24/2012   Hypothyroidism 05/24/2012   Migraine 05/24/2012   Vitamin D  deficiency 05/24/2012   Asthma 05/24/2012   Depression with anxiety 05/24/2012    Past Surgical History:  Procedure Laterality Date   BIOPSY  05/02/2020   Procedure: BIOPSY;  Surgeon: San Sandor GAILS, DO;  Location: [facility];  Service: Gastroenterology;;   COLONOSCOPY WITH PROPOFOL  N/A 05/02/2020   Procedure: COLONOSCOPY WITH PROPOFOL ;  Surgeon: San Sandor GAILS, DO;  Location: [facility];  Service: Gastroenterology;  Laterality: N/A;   HERNIA REPAIR     Umbilical around 2002 and then repair 2015   LAPAROSCOPIC ASSISTED VENTRAL HERNIA REPAIR N/A 02/27/2021   Procedure: LAPAROSCOPIC ASSISTED VENTRAL HERNIA REPAIR WITH MESH;  Surgeon: Gladis Cough, MD;  Location: [facility];  Service: General;  Laterality: N/A;   LAPAROSCOPIC GASTRIC SLEEVE RESECTION N/A 05/14/2013   Procedure: LAPAROSCOPIC GASTRIC SLEEVE RESECTION;  Surgeon: Donnice KATHEE Lunger, MD;  Location: [facility];  Service: General;  Laterality: N/A;   POLYPECTOMY  05/02/2020   Procedure: POLYPECTOMY;  Surgeon: San Sandor GAILS, DO;  Location: [facility];  Service: Gastroenterology;;   TONSILLECTOMY     UMBILICAL HERNIA REPAIR  05/14/2013   Procedure: HERNIA REPAIR UMBILICAL ADULT;  Surgeon: Donnice KATHEE Lunger, MD;  Location: [facility];  Service: General;;   UPPER GI ENDOSCOPY  05/14/2013   Procedure: UPPER GI ENDOSCOPY;  Surgeon: Donnice KATHEE Lunger, MD;   Location: [facility];  Service: General;;    OB History     Gravida  3   Para  3   Term  3   Preterm      AB      Living         SAB      IAB      Ectopic      Multiple      Live Births               Home Medications    Prior to Admission medications   Medication Sig Start Date End Date Taking? Authorizing Provider  acyclovir ointment (ZOVIRAX) 5 % Apply 1 Application topically every 3 (three) hours. 04/19/24  Yes Maranda Jamee Jacob, MD  azithromycin  (ZITHROMAX  Z-PAK) 250 MG tablet Take two pills today followed by one a day until gone 04/19/24  Yes Maranda Jamee Jacob, MD  benzonatate  (TESSALON ) 200 MG capsule Take 1 capsule (200 mg total) by mouth 3 (three) times daily as needed for cough. 04/19/24  Yes Maranda Jamee Jacob, MD  chlorpheniramine-HYDROcodone  (TUSSIONEX) 10-8 MG/5ML Take 5 mLs by mouth every 12 (twelve) hours as needed for cough. 04/19/24  Yes Maranda Jamee Jacob, MD  predniSONE  (DELTASONE ) 50 MG tablet Take once a day for 5 days.  Take with food 04/19/24  Yes Maranda Jamee Jacob, MD  atorvastatin  (LIPITOR) 40 MG tablet Take 1 tablet (40 mg total) by mouth daily. 08/13/23   Breeback, Jade L, PA-C  levalbuterol (XOPENEX HFA) 45 MCG/ACT inhaler Inhale 1-2 puffs into the lungs every 6 (six) hours as needed for wheezing. 05/23/23   Breeback, Jade L, PA-C  levothyroxine  (SYNTHROID ) 137 MCG tablet Take 1 tablet (137 mcg total) by mouth daily before breakfast. 02/26/24   Breeback, Jade L, PA-C  montelukast  (SINGULAIR ) 10 MG tablet TAKE 1 TABLET BY MOUTH EVERY DAY 10/15/23   Breeback, Jade L, PA-C  SUMAtriptan  (IMITREX ) 100 MG tablet TAKE 1 TABLET BY MOUTH EVERY 2 HOURS AS NEEDED FOR MIGRAINE (MAX 2/24 HRS). NEEDS APPT FOR REFILLS 01/13/24   Antoniette Rasher L, PA-C  tirzepatide  (MOUNJARO ) 10 MG/0.5ML Pen Inject 10 mg into the skin once a week. 02/26/24   Breeback, Jade L, PA-C  Vitamin D , Ergocalciferol , (DRISDOL ) 1.25 MG (50000 UNIT) CAPS capsule Take 1 capsule (50,000  Units total) by mouth every 7 (seven) days. 02/26/24   Antoniette Rasher CROME, PA-C    Family History Family History  Problem Relation Age of Onset   Hypertension Mother    Heart disease Mother    Depression Mother    Hypertension Father    Diabetes Father    Sleep apnea Father    Heart disease Maternal Grandfather    Heart attack Maternal Grandfather    Cancer Maternal Grandfather        bladder   Hyperlipidemia  Other    Sleep apnea Other    Esophageal cancer Paternal Grandfather    Colon cancer Maternal Great-grandmother     Social History Social History   Tobacco Use   Smoking status: Never   Smokeless tobacco: Never  Vaping Use   Vaping status: Never Used  Substance Use Topics   Alcohol use: Yes    Alcohol/week: 1.0 standard drink of alcohol    Types: 1 drink(s) per week    Comment: 2 glasses wine @ 2x/month   Drug use: No     Allergies   Metformin  and related   Review of Systems Review of Systems See HPI  Physical Exam Triage Vital Signs ED Triage Vitals  Encounter Vitals Group     BP 04/19/24 0830 134/83     Girls Systolic BP Percentile --      Girls Diastolic BP Percentile --      Boys Systolic BP Percentile --      Boys Diastolic BP Percentile --      Pulse Rate 04/19/24 0830 78     Resp 04/19/24 0830 19     Temp 04/19/24 0830 98.8 F (37.1 C)     Temp src --      SpO2 04/19/24 0830 98 %     Weight --      Height --      Head Circumference --      Peak Flow --      Pain Score 04/19/24 0828 6     Pain Loc --      Pain Education --      Exclude from Growth Chart --    No data found.  Updated Vital Signs BP 134/83   Pulse 78   Temp 98.8 F (37.1 C)   Resp 19   LMP  (LMP Unknown) Comment: last year- has had some break through bleeding. Will be considered menopause after new years if no more.  SpO2 98%      Physical Exam Constitutional:      General: She is not in acute distress.    Appearance: She is well-developed.  HENT:     Head:  Normocephalic and atraumatic.     Right Ear: Tympanic membrane normal.     Left Ear: Tympanic membrane normal.     Nose: No congestion.     Mouth/Throat:     Pharynx: No posterior oropharyngeal erythema.  Eyes:     Conjunctiva/sclera: Conjunctivae normal.     Pupils: Pupils are equal, round, and reactive to light.  Cardiovascular:     Rate and Rhythm: Normal rate.     Heart sounds: Normal heart sounds.  Pulmonary:     Effort: Pulmonary effort is normal. No respiratory distress.     Breath sounds: Normal breath sounds.  Musculoskeletal:        General: Normal range of motion.     Cervical back: Normal range of motion.  Skin:    General: Skin is warm and dry.  Neurological:     Mental Status: She is alert.      UC Treatments / Results  Labs (all labs ordered are listed, but only abnormal results are displayed) Labs Reviewed - No data to display  EKG   Radiology No results found.  Procedures Procedures (including critical Davidson time)  Medications Ordered in UC Medications - No data to display  Initial Impression / Assessment and Plan / UC Course  I have reviewed the triage vital signs and the  nursing notes.  Pertinent labs & imaging results that were available during my Davidson of the patient were reviewed by me and considered in my medical decision making (see chart for details).    Patient is given a Z-Pak with instructions to take this if she fails with conservative treatment. Final Clinical Impressions(s) / UC Diagnoses   Final diagnoses:  Herpes labialis  Viral upper respiratory tract infection with cough  Mild intermittent asthma with exacerbation     Discharge Instructions      Drink lots of water Take the prednisone  once a day for 5 days Take tessalon  perles for daytime cough I have prescribed tussionex to take at night I have prescribed azithromycin  antibiotic in case needed.  This is likely a virus, but antibiotics are used if you fail to  improve I have prescribed ointment for the lip rash   ED Prescriptions     Medication Sig Dispense Auth. Provider   benzonatate  (TESSALON ) 200 MG capsule Take 1 capsule (200 mg total) by mouth 3 (three) times daily as needed for cough. 21 capsule Maranda Jamee Jacob, MD   azithromycin  (ZITHROMAX  Z-PAK) 250 MG tablet Take two pills today followed by one a day until gone 6 tablet Maranda Jamee Jacob, MD   predniSONE  (DELTASONE ) 50 MG tablet Take once a day for 5 days.  Take with food 5 tablet Maranda Jamee Jacob, MD   acyclovir ointment (ZOVIRAX) 5 % Apply 1 Application topically every 3 (three) hours. 5 g Maranda Jamee Jacob, MD   chlorpheniramine-HYDROcodone  (TUSSIONEX) 10-8 MG/5ML Take 5 mLs by mouth every 12 (twelve) hours as needed for cough. 115 mL Maranda Jamee Jacob, MD      I have reviewed the PDMP during this encounter.   Maranda Jamee Jacob, MD 04/19/24 [phone]

## 2024-05-18 ENCOUNTER — Other Ambulatory Visit: Payer: Self-pay | Admitting: Physician Assistant

## 2024-05-18 ENCOUNTER — Encounter: Payer: Self-pay | Admitting: Physician Assistant

## 2024-05-18 DIAGNOSIS — E1169 Type 2 diabetes mellitus with other specified complication: Secondary | ICD-10-CM

## 2024-05-18 DIAGNOSIS — E66813 Obesity, class 3: Secondary | ICD-10-CM

## 2024-05-18 DIAGNOSIS — E039 Hypothyroidism, unspecified: Secondary | ICD-10-CM

## 2024-06-01 ENCOUNTER — Ambulatory Visit: Admitting: Physician Assistant

## 2024-06-08 ENCOUNTER — Ambulatory Visit: Admitting: Physician Assistant

## 2024-06-16 ENCOUNTER — Ambulatory Visit: Admitting: Physician Assistant

## 2024-06-16 ENCOUNTER — Encounter: Payer: Self-pay | Admitting: Physician Assistant

## 2024-06-16 VITALS — BP 135/75 | HR 71 | Ht 66.0 in | Wt 285.0 lb

## 2024-06-16 DIAGNOSIS — E66813 Obesity, class 3: Secondary | ICD-10-CM

## 2024-06-16 DIAGNOSIS — Z6841 Body Mass Index (BMI) 40.0 and over, adult: Secondary | ICD-10-CM

## 2024-06-16 DIAGNOSIS — E559 Vitamin D deficiency, unspecified: Secondary | ICD-10-CM

## 2024-06-16 DIAGNOSIS — E1169 Type 2 diabetes mellitus with other specified complication: Secondary | ICD-10-CM

## 2024-06-16 DIAGNOSIS — Z7985 Long-term (current) use of injectable non-insulin antidiabetic drugs: Secondary | ICD-10-CM

## 2024-06-16 DIAGNOSIS — E785 Hyperlipidemia, unspecified: Secondary | ICD-10-CM

## 2024-06-16 DIAGNOSIS — E039 Hypothyroidism, unspecified: Secondary | ICD-10-CM | POA: Diagnosis not present

## 2024-06-16 LAB — POCT GLYCOSYLATED HEMOGLOBIN (HGB A1C): Hemoglobin A1C: 5.7 % — AB (ref 4.0–5.6)

## 2024-06-16 MED ORDER — VITAMIN D (ERGOCALCIFEROL) 1.25 MG (50000 UNIT) PO CAPS: 50000.0000 [IU] | ORAL_CAPSULE | ORAL | 1 refills | Status: AC

## 2024-06-16 MED ORDER — ATORVASTATIN CALCIUM 40 MG PO TABS: 40.0000 mg | ORAL_TABLET | Freq: Every day | ORAL | 3 refills | Status: AC

## 2024-06-16 NOTE — Patient Instructions (Signed)
 Diabetes Mellitus and Nutrition Learn about nutritional building blocks in different types of food, which are the most important for a person with diabetes, and how to eat a balanced, healthy diet. To view the content, go to this web address: https://pe.elsevier.com/[email or web address]  This video will expire on: 05/14/2025. If you need access to this video following this date, please reach out to the healthcare provider who assigned it to you. This information is not intended to replace advice given to you by your health care provider. Make sure you discuss any questions you have with your health care provider. Elsevier Patient Education  2024 [employer].

## 2024-06-16 NOTE — Progress Notes (Signed)
 "  Established Patient Office Visit  Subjective   Patient ID: Amanda Davidson, female    DOB: 06-Aug-1970  Age: 54 y.o. MRN: [id number]  Chief Complaint  Patient presents with   Medical Management of Chronic Issues    54 year old female presents for follow-up for management of chronic conditions including type 2 DM, HTN, obesity, and hyperlipidemia. She has recently returned from a cruise where she states that she did not follow a good diet and had very little exercise. Typically, she eats high protein, small portion meals and walks approximately 3 miles per day. She plans to return to her normal diet and has a walking pad so that she can walk inside while the weather is cold. HbA1C in 11/2023 was 5.5, today it is 5.7. She reports some recent constipation, but attributes this to dehydration.   At home b/p readings have been averaging 130/65. Compliant with medication.  She is checking sugars at home and running high in mornings. At times in the 130s to 160s and then decrease after she eats and throughout the day to 110s.   Patient Active Problem List   Diagnosis Date Noted   Family history of dementia 02/25/2024   Increased heart rate 08/13/2023   Right-sided chest pain 08/13/2023   Elevated blood pressure reading 08/02/2022   Seasonal allergies 09/28/2021   Hyperlipidemia LDL goal <70 07/02/2021   S/P repair of recurrent ventral hernia 02/27/2021   Change in stool    Colon cancer screening    Polyp of sigmoid colon    Grade II internal hemorrhoids    Snoring 04/21/2018   Tonsillar mass 04/20/2018   Tonsillar enlargement 04/20/2018   Pre-diabetes 11/19/2017   Trigger finger, left ring finger 10/23/2017   Adjustment disorder with mixed anxiety and depressed mood 10/23/2017   Diabetes mellitus (HCC) 05/19/2017   Insulin  resistance 05/24/2013   Laparoscopic sleeve gastrectomy Dec 2014 05/14/2013   Asthma, moderate persistent 09/18/2012   Class 3 severe obesity due to excess  calories with serious comorbidity and body mass index (BMI) of 40.0 to 44.9 in adult (HCC) 09/18/2012   Hypertension, essential, benign 06/30/2012   OSA (obstructive sleep apnea) 05/24/2012   Hypothyroidism 05/24/2012   Migraine 05/24/2012   Vitamin D  deficiency 05/24/2012   Asthma 05/24/2012   Depression with anxiety 05/24/2012   Past Medical History:  Diagnosis Date   Anemia    Asthma    Complication of anesthesia    NAUSEA   Depression    Diabetes mellitus without complication (HCC)    Drug use    Exercise-induced asthma    GERD (gastroesophageal reflux disease)    Headache(784.0)    MIGRAINES   Hypertension    Hypothyroidism    IBS (irritable bowel syndrome)    Migraines    Obesity    Pneumonia    PONV (postoperative nausea and vomiting)    Post-nasal drip    Shortness of breath    Sleep apnea    C-PAP but doesnt use it regularly    Vitamin D  deficiency    Family History  Problem Relation Age of Onset   Hypertension Mother    Heart disease Mother    Depression Mother    Hypertension Father    Diabetes Father    Sleep apnea Father    Heart disease Maternal Grandfather    Heart attack Maternal Grandfather    Cancer Maternal Grandfather        bladder   Hyperlipidemia Other  Sleep apnea Other    Esophageal cancer Paternal Grandfather    Colon cancer Maternal Great-grandmother    Allergies[1]    ROS See HPI   Objective:     BP 135/75 (Cuff Size: Normal)   Pulse 71   Ht 5' 6 (1.676 m)   Wt 285 lb (129.3 kg)   SpO2 99%   BMI 46.00 kg/m  BP Readings from Last 3 Encounters:  06/16/24 135/75  04/19/24 134/83  02/25/24 124/68   Wt Readings from Last 3 Encounters:  06/16/24 285 lb (129.3 kg)  02/25/24 279 lb (126.6 kg)  11/21/23 270 lb (122.5 kg)      Physical Exam Constitutional:      Appearance: Normal appearance. She is obese.  HENT:     Head: Normocephalic and atraumatic.  Eyes:     Pupils: Pupils are equal, round, and reactive to  light.  Cardiovascular:     Rate and Rhythm: Normal rate and regular rhythm.     Heart sounds: Normal heart sounds.  Pulmonary:     Effort: Pulmonary effort is normal.     Breath sounds: Normal breath sounds.  Skin:    General: Skin is warm and dry.  Neurological:     General: No focal deficit present.     Mental Status: She is alert and oriented to person, place, and time.  Psychiatric:        Mood and Affect: Mood normal.        Behavior: Behavior normal.      Results for orders placed or performed in visit on 06/16/24  POCT HgB A1C  Result Value Ref Range   Hemoglobin A1C 5.7 (A) 4.0 - 5.6 %   HbA1c POC (<> result, manual entry)     HbA1c, POC (prediabetic range)     HbA1c, POC (controlled diabetic range)     The 10-year ASCVD risk score (Arnett DK, et al., 2019) is: 3.9%    Assessment & Plan:  Amanda Davidson was seen today for medical management of chronic issues.  Diagnoses and all orders for this visit:  Hyperlipidemia associated with type 2 diabetes mellitus (HCC) -     POCT HgB A1C -     atorvastatin  (LIPITOR) 40 MG tablet; Take 1 tablet (40 mg total) by mouth daily.  Hypothyroidism, unspecified type  Class 3 severe obesity due to excess calories with serious comorbidity and body mass index (BMI) of 45.0 to 49.9 in adult (HCC)  Hyperlipidemia LDL goal <70 -     atorvastatin  (LIPITOR) 40 MG tablet; Take 1 tablet (40 mg total) by mouth daily.  Vitamin D  deficiency -     Vitamin D , Ergocalciferol , (DRISDOL ) 1.25 MG (50000 UNIT) CAPS capsule; Take 1 capsule (50,000 Units total) by mouth every 7 (seven) days.    Diabetes Mellitus Type 2 -POCT HbA1C at goal (5.7 in clinic) -diabetic foot exam UTD -eye exam due soon; schedule appointment with Amanda Davidson -continue tirzepatide  once weekly - BP close to goal -declined flu/covid/pneumonia shot today  Hypothyroidism -TSH and free T4 WNL 3 months ago; repeat labs in 9 months  -continue levothyroxine ; refilled  today  Class 3 Obesity -continue tirizepitide once weekly; consider increasing dose if weight continues to increase  -weight increase from 279 lbs to 285 lbs -discussed importance of regular exercise and healthy eating habits; patient plans to start walking with walking pad and follow high protein low carb diet   Hyperlipidemia -discussed importance of management to prevent microvascular changes and reduce  cognitive decline -lipid panel 3 months prior showed elevated triglycerides; repeat labs in 3-6 months -continue atorvastatin  40 mg; refilled today  -encouraged healthy diet and exercise     Return in about 3 months (around 09/14/2024) for DM follow up.    Ryanne Morand, PA-C     [1]  Allergies Allergen Reactions   Metformin  And Related     Diarrhea/GI upset   "

## 2024-09-15 ENCOUNTER — Ambulatory Visit: Admitting: Physician Assistant
